# Patient Record
Sex: Male | Born: 1960 | Race: White | Hispanic: No | Smoking: Never smoker
Health system: Southern US, Community
[De-identification: ages and names within clinical notes are randomized; demographics above are authoritative.]

## PROBLEM LIST (undated history)

## (undated) DIAGNOSIS — K219 Gastro-esophageal reflux disease without esophagitis: Secondary | ICD-10-CM

## (undated) DIAGNOSIS — E119 Type 2 diabetes mellitus without complications: Secondary | ICD-10-CM

## (undated) DIAGNOSIS — E079 Disorder of thyroid, unspecified: Secondary | ICD-10-CM

## (undated) DIAGNOSIS — Z87442 Personal history of urinary calculi: Secondary | ICD-10-CM

## (undated) DIAGNOSIS — R519 Headache, unspecified: Secondary | ICD-10-CM

## (undated) DIAGNOSIS — K429 Umbilical hernia without obstruction or gangrene: Secondary | ICD-10-CM

## (undated) DIAGNOSIS — R51 Headache: Secondary | ICD-10-CM

## (undated) DIAGNOSIS — I219 Acute myocardial infarction, unspecified: Secondary | ICD-10-CM

## (undated) HISTORY — PX: TONSILLECTOMY: SUR1361

## (undated) MED FILL — Fosaprepitant Dimeglumine For IV Infusion 150 MG (Base Eq): INTRAVENOUS | Qty: 5 | Status: AC

## (undated) MED FILL — Dexamethasone Sodium Phosphate Inj 100 MG/10ML: INTRAMUSCULAR | Qty: 1 | Status: AC

---

## 2003-06-23 DIAGNOSIS — I219 Acute myocardial infarction, unspecified: Secondary | ICD-10-CM

## 2003-06-23 HISTORY — DX: Acute myocardial infarction, unspecified: I21.9

## 2004-05-22 ENCOUNTER — Other Ambulatory Visit: Payer: Self-pay

## 2004-05-22 ENCOUNTER — Emergency Department: Payer: Self-pay | Admitting: Emergency Medicine

## 2004-11-17 ENCOUNTER — Ambulatory Visit: Payer: Self-pay | Admitting: Neurology

## 2006-02-20 ENCOUNTER — Other Ambulatory Visit: Payer: Self-pay

## 2006-02-20 ENCOUNTER — Emergency Department: Payer: Self-pay | Admitting: Emergency Medicine

## 2007-12-13 ENCOUNTER — Ambulatory Visit: Payer: Self-pay | Admitting: Family Medicine

## 2009-09-28 ENCOUNTER — Emergency Department: Payer: Self-pay | Admitting: Emergency Medicine

## 2010-01-29 ENCOUNTER — Emergency Department: Payer: Self-pay | Admitting: Emergency Medicine

## 2010-02-03 ENCOUNTER — Emergency Department: Payer: Self-pay | Admitting: Emergency Medicine

## 2010-12-29 ENCOUNTER — Emergency Department: Payer: Self-pay | Admitting: *Deleted

## 2014-10-24 ENCOUNTER — Other Ambulatory Visit: Payer: Self-pay

## 2014-10-24 ENCOUNTER — Emergency Department
Admission: EM | Admit: 2014-10-24 | Discharge: 2014-10-24 | Disposition: A | Discharge status: Home / Self Care | Payer: 59 | Attending: Emergency Medicine | Admitting: Emergency Medicine

## 2014-10-24 ENCOUNTER — Emergency Department: Payer: 59

## 2014-10-24 ENCOUNTER — Encounter: Payer: Self-pay | Admitting: Emergency Medicine

## 2014-10-24 DIAGNOSIS — R109 Unspecified abdominal pain: Secondary | ICD-10-CM | POA: Diagnosis present

## 2014-10-24 DIAGNOSIS — Z87891 Personal history of nicotine dependence: Secondary | ICD-10-CM | POA: Insufficient documentation

## 2014-10-24 DIAGNOSIS — R1031 Right lower quadrant pain: Secondary | ICD-10-CM | POA: Insufficient documentation

## 2014-10-24 HISTORY — DX: Acute myocardial infarction, unspecified: I21.9

## 2014-10-24 HISTORY — DX: Disorder of thyroid, unspecified: E07.9

## 2014-10-24 LAB — CBC WITH DIFFERENTIAL/PLATELET
Basophils Absolute: 0.1 10*3/uL (ref 0–0.1)
Basophils Relative: 1 %
EOS ABS: 0.2 10*3/uL (ref 0–0.7)
Eosinophils Relative: 3 %
HCT: 48.6 % (ref 40.0–52.0)
HEMOGLOBIN: 16.9 g/dL (ref 13.0–18.0)
Lymphocytes Relative: 26 %
Lymphs Abs: 1.6 10*3/uL (ref 1.0–3.6)
MCH: 31.1 pg (ref 26.0–34.0)
MCHC: 34.8 g/dL (ref 32.0–36.0)
MCV: 89.4 fL (ref 80.0–100.0)
MONOS PCT: 14 %
Monocytes Absolute: 0.9 10*3/uL (ref 0.2–1.0)
Neutro Abs: 3.6 10*3/uL (ref 1.4–6.5)
Neutrophils Relative %: 56 %
Platelets: 166 10*3/uL (ref 150–440)
RBC: 5.43 MIL/uL (ref 4.40–5.90)
RDW: 12.8 % (ref 11.5–14.5)
WBC: 6.3 10*3/uL (ref 3.8–10.6)

## 2014-10-24 LAB — URINALYSIS COMPLETE WITH MICROSCOPIC (ARMC ONLY)
BACTERIA UA: NONE SEEN
BILIRUBIN URINE: NEGATIVE
GLUCOSE, UA: NEGATIVE mg/dL
Hgb urine dipstick: NEGATIVE
KETONES UR: NEGATIVE mg/dL
Leukocytes, UA: NEGATIVE
Nitrite: NEGATIVE
PROTEIN: NEGATIVE mg/dL
Specific Gravity, Urine: 1.017 (ref 1.005–1.030)
pH: 5 (ref 5.0–8.0)

## 2014-10-24 LAB — COMPREHENSIVE METABOLIC PANEL
ALBUMIN: 4.6 g/dL (ref 3.5–5.0)
ALT: 40 U/L (ref 17–63)
AST: 32 U/L (ref 15–41)
Alkaline Phosphatase: 76 U/L (ref 38–126)
Anion gap: 6 (ref 5–15)
BUN: 16 mg/dL (ref 6–20)
CALCIUM: 9.1 mg/dL (ref 8.9–10.3)
CO2: 26 mmol/L (ref 22–32)
Chloride: 107 mmol/L (ref 101–111)
Creatinine, Ser: 0.81 mg/dL (ref 0.61–1.24)
GFR calc Af Amer: >60 mL/min (ref >60)
Glucose, Bld: 119 mg/dL — ABNORMAL HIGH (ref 65–99)
Potassium: 4.2 mmol/L (ref 3.5–5.1)
SODIUM: 139 mmol/L (ref 135–145)
Total Bilirubin: 0.8 mg/dL (ref 0.3–1.2)
Total Protein: 7.4 g/dL (ref 6.5–8.1)

## 2014-10-24 LAB — LIPASE, BLOOD: Lipase: 38 U/L (ref 22–51)

## 2014-10-24 MED ORDER — IOHEXOL 300 MG/ML  SOLN
25.0000 mL | Freq: Once | INTRAMUSCULAR | Status: AC | PRN
  Administered 2014-10-24: 25 mL via ORAL

## 2014-10-24 MED ORDER — IOHEXOL 300 MG/ML  SOLN
100.0000 mL | Freq: Once | INTRAMUSCULAR | Status: AC | PRN
  Administered 2014-10-24: 100 mL via INTRAVENOUS

## 2014-10-24 NOTE — ED Notes (Signed)
Pt still awaiting to see edp.

## 2014-10-24 NOTE — ED Notes (Signed)
Sent here by [facility], for right flank pain x2 weeks , but on and off 3 months , denies urinary issues

## 2014-10-24 NOTE — ED Provider Notes (Signed)
[facility] Emergency Department Provider Note    ____________________________________________  Time seen:   I have reviewed the triage vital signs and the nursing notes.   HISTORY  Chief Complaint Flank Pain       HPI Joe Adams is a 54 y.o. male who states he's had right-sided abdominal pain for several weeks. It is intermittent it is moderate nothing seems to make it better or worse. No nausea no vomiting no diarrhea no constipation. It's always located in the right upper quadrant and right flank. He saw urgent care today and they referred him to emergency department for CT scan for palpable mass on the right side.     Past Medical History  Diagnosis Date  . Acute MI   . Thyroid disease     There are no active problems to display for this patient.   No past surgical history on file.  Current Outpatient Rx  Name  Route  Sig  Dispense  Refill  . ibuprofen (ADVIL,MOTRIN) 200 MG tablet   Oral   Take 200 mg by mouth every 6 (six) hours as needed.           Allergies Review of patient's allergies indicates no known allergies.  No family history on file.  Social History History  Substance Use Topics  . Smoking status: Former Research scientist (life sciences)  . Smokeless tobacco: Not on file  . Alcohol Use: Yes    Review of Systems  Constitutional: Negative for fever. Eyes: Negative for visual changes. ENT: Negative for sore throat. Cardiovascular: Negative for chest pain. Respiratory: Negative for shortness of breath. Gastrointestinal: Negative for vomiting and diarrhea. Genitourinary: Negative for dysuria. Musculoskeletal: Negative for back pain. Skin: Negative for rash. Neurological: Negative for headaches, focal weakness or numbness.   10-point ROS otherwise negative.  ____________________________________________   PHYSICAL EXAM:  VITAL SIGNS: ED Triage Vitals  Enc Vitals Group     BP 10/24/14 1043 138/87 mmHg     Pulse Rate  10/24/14 1043 64     Resp 10/24/14 1043 18     Temp 10/24/14 1043 97.6 F (36.4 C)     Temp Source 10/24/14 1043 Oral     SpO2 10/24/14 1043 99 %     Weight 10/24/14 1043 198 lb (89.812 kg)     Height 10/24/14 1043 5\' 5"  (1.651 m)     Head Cir --      Peak Flow --      Pain Score 10/24/14 1047 6     Pain Loc --      Pain Edu? --      Excl. in [place]? --      Constitutional: Alert and oriented. Well appearing and in no distress. Eyes: Conjunctivae are normal. PERRL. Normal extraocular movements. ENT   Head: Normocephalic and atraumatic.   Nose: No congestion/rhinnorhea.   Mouth/Throat: Mucous membranes are moist.   Neck: No stridor. Hematological/Lymphatic/Immunilogical: No cervical lymphadenopathy. Cardiovascular: Normal rate, regular rhythm. Normal and symmetric distal pulses are present in all extremities. No murmurs, rubs, or gallops. Respiratory: Normal respiratory effort without tachypnea nor retractions. Breath sounds are clear and equal bilaterally. No wheezes/rales/rhonchi. Gastrointestinal: Soft with tenderness of the right abdomen and right upper quadrant.. No distention. He does have a palpable mass in the right side of the abdomen which I suspect is the liver tip Genitourinary:  Musculoskeletal: Nontender with normal range of motion in all extremities. No joint effusions.  No lower extremity tenderness nor edema. Neurologic:  Normal  speech and language. No gross focal neurologic deficits are appreciated. Speech is normal. No gait instability. Skin:  Skin is warm, dry and intact. No rash noted. Psychiatric: Mood and affect are normal. Speech and behavior are normal. Patient exhibits appropriate insight and judgment.  ____________________________________________    LABS (pertinent positives/negatives)   Lipase negative urinalysis negative metabolic panel unremarkable white blood cell count normal ____________________________________________   EKG  Sinus  bradycardia 57 bpm narrow QRS normal axis normal ST and T-wave  ____________________________________________    RADIOLOGY   CT result reviewed there is mild hepatic fatty infiltration otherwise no acute findings ____________________________________________   PROCEDURES  Procedure(s) performed: None  Critical Care performed: No  ____________________________________________   INITIAL IMPRESSION / ASSESSMENT AND PLAN / ED COURSE  Pertinent labs & imaging results that were available during my care of the patient were reviewed by me and considered in my medical decision making (see chart for details).  Although patient's laboratory findings are unremarkable he does have a palpable mass on the right side of his abdomen and I will go ahead and perform a CT.  No acute abnormality found discussed return precautions and follow-up plan with primary care doctor.  ____________________________________________   FINAL CLINICAL IMPRESSION(S) / ED DIAGNOSES  Final diagnoses:  None   intermittent right-sided abdominal pain, nonspecific   Lisa Roca, MD 10/24/14 1523

## 2014-10-24 NOTE — ED Notes (Signed)
Pt returns from ct at this time.

## 2014-10-24 NOTE — ED Notes (Signed)
Patient transported to CT 

## 2014-10-24 NOTE — Discharge Instructions (Signed)

## 2014-10-31 ENCOUNTER — Emergency Department: Payer: 59

## 2014-10-31 ENCOUNTER — Emergency Department
Admission: EM | Admit: 2014-10-31 | Discharge: 2014-10-31 | Disposition: A | Discharge status: Home / Self Care | Payer: 59 | Attending: Emergency Medicine | Admitting: Emergency Medicine

## 2014-10-31 ENCOUNTER — Encounter: Payer: Self-pay | Admitting: Emergency Medicine

## 2014-10-31 DIAGNOSIS — R109 Unspecified abdominal pain: Secondary | ICD-10-CM | POA: Diagnosis not present

## 2014-10-31 LAB — COMPREHENSIVE METABOLIC PANEL
ALT: 41 U/L (ref 17–63)
ANION GAP: 7 (ref 5–15)
AST: 34 U/L (ref 15–41)
Albumin: 4.5 g/dL (ref 3.5–5.0)
Alkaline Phosphatase: 72 U/L (ref 38–126)
BILIRUBIN TOTAL: 0.8 mg/dL (ref 0.3–1.2)
BUN: 24 mg/dL — AB (ref 6–20)
CO2: 26 mmol/L (ref 22–32)
Calcium: 8.9 mg/dL (ref 8.9–10.3)
Chloride: 106 mmol/L (ref 101–111)
Creatinine, Ser: 0.92 mg/dL (ref 0.61–1.24)
GFR calc Af Amer: >60 mL/min (ref >60)
GLUCOSE: 127 mg/dL — AB (ref 65–99)
POTASSIUM: 3.6 mmol/L (ref 3.5–5.1)
Sodium: 139 mmol/L (ref 135–145)
Total Protein: 7.3 g/dL (ref 6.5–8.1)

## 2014-10-31 LAB — CBC WITH DIFFERENTIAL/PLATELET
Basophils Absolute: 0.1 10*3/uL (ref 0–0.1)
Basophils Relative: 1 %
Eosinophils Absolute: 0.2 10*3/uL (ref 0–0.7)
Eosinophils Relative: 2 %
HEMATOCRIT: 46.1 % (ref 40.0–52.0)
Hemoglobin: 16.1 g/dL (ref 13.0–18.0)
LYMPHS ABS: 1.8 10*3/uL (ref 1.0–3.6)
LYMPHS PCT: 26 %
MCH: 31.1 pg (ref 26.0–34.0)
MCHC: 34.9 g/dL (ref 32.0–36.0)
MCV: 89 fL (ref 80.0–100.0)
MONO ABS: 0.4 10*3/uL (ref 0.2–1.0)
MONOS PCT: 6 %
NEUTROS ABS: 4.7 10*3/uL (ref 1.4–6.5)
Neutrophils Relative %: 65 %
Platelets: 172 10*3/uL (ref 150–440)
RBC: 5.19 MIL/uL (ref 4.40–5.90)
RDW: 12.8 % (ref 11.5–14.5)
WBC: 7.2 10*3/uL (ref 3.8–10.6)

## 2014-10-31 LAB — URINALYSIS COMPLETE WITH MICROSCOPIC (ARMC ONLY)
Bilirubin Urine: NEGATIVE
Glucose, UA: NEGATIVE mg/dL
HGB URINE DIPSTICK: NEGATIVE
Ketones, ur: NEGATIVE mg/dL
Leukocytes, UA: NEGATIVE
NITRITE: NEGATIVE
PH: 5 (ref 5.0–8.0)
Protein, ur: NEGATIVE mg/dL
Specific Gravity, Urine: 1.021 (ref 1.005–1.030)
Squamous Epithelial / LPF: NONE SEEN

## 2014-10-31 MED ORDER — OXYCODONE-ACETAMINOPHEN 5-325 MG PO TABS
ORAL_TABLET | ORAL | Status: AC
  Filled 2014-10-31: qty 2

## 2014-10-31 MED ORDER — OXYCODONE-ACETAMINOPHEN 5-325 MG PO TABS
2.0000 | ORAL_TABLET | Freq: Once | ORAL | Status: AC
  Administered 2014-10-31: 2 via ORAL

## 2014-10-31 MED ORDER — CYCLOBENZAPRINE HCL 10 MG PO TABS: 10.0000 mg | ORAL_TABLET | Freq: Three times a day (TID) | ORAL | Status: AC | PRN

## 2014-10-31 NOTE — ED Provider Notes (Signed)
[facility] Emergency Department Provider Note    Time seen: 1850  I have reviewed the triage vital signs and the nursing notes.   HISTORY  Chief Complaint Flank Pain    HPI Joe Adams is a 54 y.o. male who presents ER for right sided abdominal pain for the last few weeks. Nothing seems to make it better pressure on the area or touching scan makes it worse.His pain is severe and dull, states it feels like when he had a kidney stone. Seen here recently had a CT scan is normal.    Past Medical History  Diagnosis Date  . Acute MI   . Thyroid disease     There are no active problems to display for this patient.   History reviewed. No pertinent past surgical history.  Current Outpatient Rx  Name  Route  Sig  Dispense  Refill  . ibuprofen (ADVIL,MOTRIN) 200 MG tablet   Oral   Take 200 mg by mouth every 6 (six) hours as needed.           Allergies Review of patient's allergies indicates no known allergies.  No family history on file.  Social History History  Substance Use Topics  . Smoking status: Never Smoker   . Smokeless tobacco: Not on file  . Alcohol Use: No    Review of Systems Constitutional: Negative for fever. Eyes: Negative for visual changes. ENT: Negative for sore throat. Cardiovascular: Negative for chest pain. Respiratory: Negative for shortness of breath. Gastrointestinal: Positive for abdominal pain negative for vomiting Genitourinary: Negative for dysuria. Musculoskeletal: Negative for back pain. Skin: Negative for rash. Neurological: Negative for headaches, focal weakness or numbness.  10-point ROS otherwise negative.  ____________________________________________   PHYSICAL EXAM:  VITAL SIGNS: ED Triage Vitals  Enc Vitals Group     BP 10/31/14 1836 132/92 mmHg     Pulse Rate 10/31/14 1836 65     Resp 10/31/14 1836 18     Temp 10/31/14 1836 98.4 F (36.9 C)     Temp Source 10/31/14 1836 Oral      SpO2 10/31/14 1836 97 %     Weight --      Height --      Head Cir --      Peak Flow --      Pain Score 10/31/14 1514 9     Pain Loc --      Pain Edu? --      Excl. in [place]? --     Constitutional: Alert and oriented. Well appearing and in no distress. Eyes: Conjunctivae are normal. PERRL. Normal extraocular movements. ENT   Head: Normocephalic and atraumatic.   Nose: No congestion/rhinnorhea.   Mouth/Throat: Mucous membranes are moist.   Neck: No stridor. Hematological/Lymphatic/Immunilogical: No cervical lymphadenopathy. Cardiovascular: Normal rate, regular rhythm. Normal and symmetric distal pulses are present in all extremities. No murmurs, rubs, or gallops. Respiratory: Normal respiratory effort without tachypnea nor retractions. Breath sounds are clear and equal bilaterally. No wheezes/rales/rhonchi. Gastrointestinal: Exquisite tenderness right flank and in the latissimus dorsi area Musculoskeletal: Nontender with normal range of motion in all extremities. No joint effusions.  No lower extremity tenderness nor edema. Neurologic:  Normal speech and language. No gross focal neurologic deficits are appreciated. Speech is normal. No gait instability. Skin:  Skin is warm, dry and intact. No rash noted. Psychiatric: Mood and affect are normal. Speech and behavior are normal. Patient exhibits appropriate insight and judgment.  ____________________________________________    LABS (  pertinent positives/negatives)  Labs Reviewed  COMPREHENSIVE METABOLIC PANEL - Abnormal; Notable for the following:    Glucose, Bld 127 (*)    BUN 24 (*)    All other components within normal limits  URINALYSIS COMPLETEWITH MICROSCOPIC ([facility])  - Abnormal; Notable for the following:    Color, Urine YELLOW (*)    APPearance CLEAR (*)    Bacteria, UA RARE (*)    All other components within normal limits  CBC WITH DIFFERENTIAL/PLATELET      ____________________________________________    RADIOLOGY  CT pelvis without contrast per stone is negative for any acute process  ____________________________________________    ED COURSE  Pertinent labs & imaging results that were available during my care of the patient were reviewed by me and considered in my medical decision making (see chart for details).  Patient will get a noncontrast CT to check for renal stone  FINAL ASSESSMENT AND PLAN  Flank pain Plan: Patient discharged with muscle relaxants and follow-up with a primary care doctor.    Earleen Newport, MD   Earleen Newport, MD 10/31/14 [phone]

## 2014-10-31 NOTE — Discharge Instructions (Signed)
Flank Pain °Flank pain refers to pain that is located on the side of the body between the upper abdomen and the back. The pain may occur over a short period of time (acute) or may be long-term or reoccurring (chronic). It may be mild or severe. Flank pain can be caused by many things. °CAUSES  °Some of the more common causes of flank pain include: °· Muscle strains.   °· Muscle spasms.   °· A disease of your spine (vertebral disk disease).   °· A lung infection (pneumonia).   °· Fluid around your lungs (pulmonary edema).   °· A kidney infection.   °· Kidney stones.   °· A very painful skin rash caused by the chickenpox virus (shingles).   °· Gallbladder disease.   °HOME CARE INSTRUCTIONS  °Home care will depend on the cause of your pain. In general, °· Rest as directed by your caregiver. °· Drink enough fluids to keep your urine clear or pale yellow. °· Only take over-the-counter or prescription medicines as directed by your caregiver. Some medicines may help relieve the pain. °· Tell your caregiver about any changes in your pain. °· Follow up with your caregiver as directed. °SEEK IMMEDIATE MEDICAL CARE IF:  °· Your pain is not controlled with medicine.   °· You have new or worsening symptoms. °· Your pain increases.   °· You have abdominal pain.   °· You have shortness of breath.   °· You have persistent nausea or vomiting.   °· You have swelling in your abdomen.   °· You feel faint or pass out.   °· You have blood in your urine. °· You have a fever or persistent symptoms for more than 2-3 days. °· You have a fever and your symptoms suddenly get worse. °MAKE SURE YOU:  °· Understand these instructions. °· Will watch your condition. °· Will get help right away if you are not doing well or get worse. °Document Released: 07/30/2005 Document Revised: 03/02/2012 Document Reviewed: 01/21/2012 °Exit[employer]® Patient Information ©2015 [employer]. This information is not intended to replace advice given to you by your  health care provider. Make sure you discuss any questions you have with your health care provider. ° °

## 2016-03-18 IMAGING — CT CT ABD-PELV W/O CM
1 of 2 series · 5 of 32 positions shown, 10 images · non-contrast
Comparison: CT scan of October 24, 2014.

CLINICAL DATA: Right flank pain for 3 months.

EXAM:
CT ABDOMEN AND PELVIS WITHOUT CONTRAST
TECHNIQUE: Multidetector CT imaging of the abdomen and pelvis was performed
following the standard protocol without IV contrast.

[Series 4: lung · axial · 0.83mm/px · z∈[+761,+841]mm · 5 of 24 slices shown, 10 images]
[im 4/24  soft-tissue]
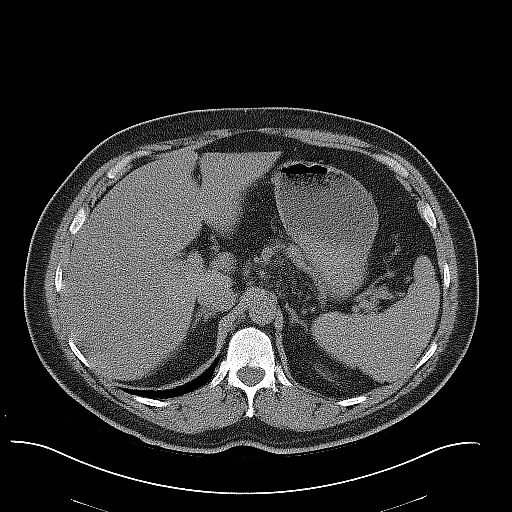
[im 4/24  bone]
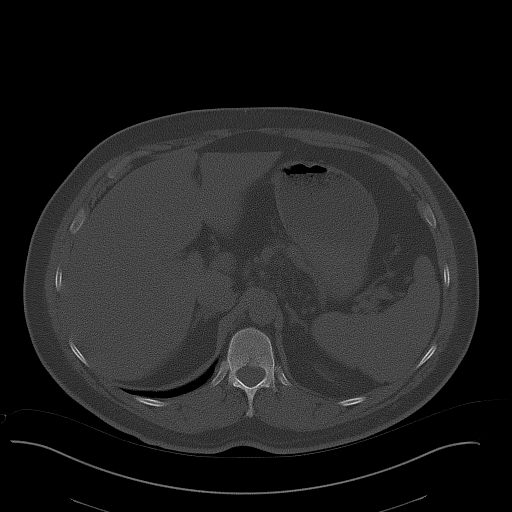
[im 8/24  soft-tissue]
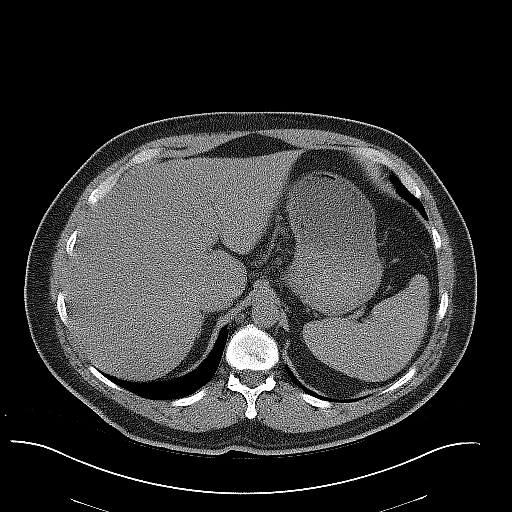
[im 8/24  lung]
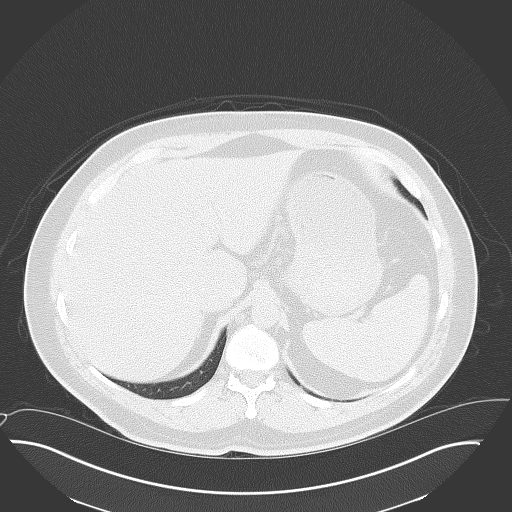
[im 12/24  soft-tissue]
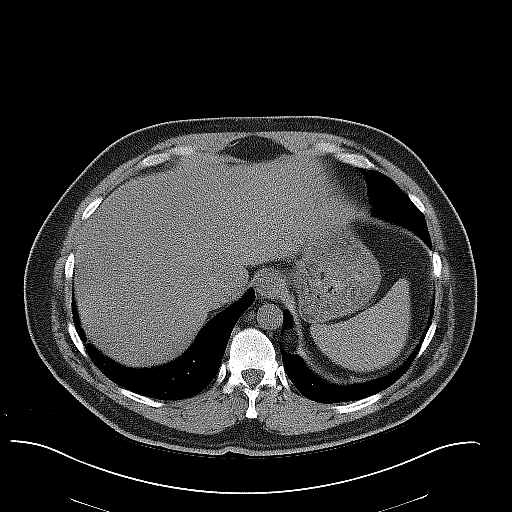
[im 12/24  lung]
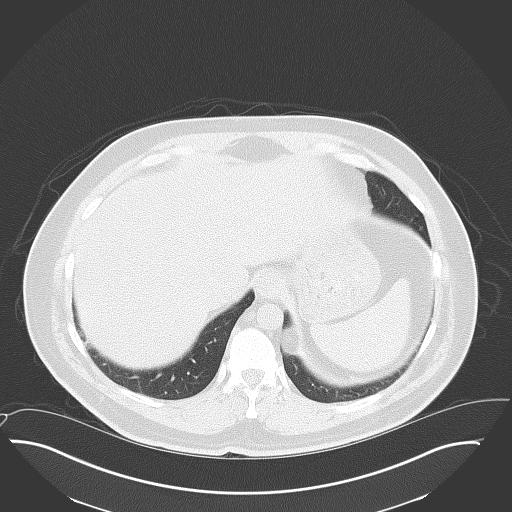
[im 16/24  soft-tissue]
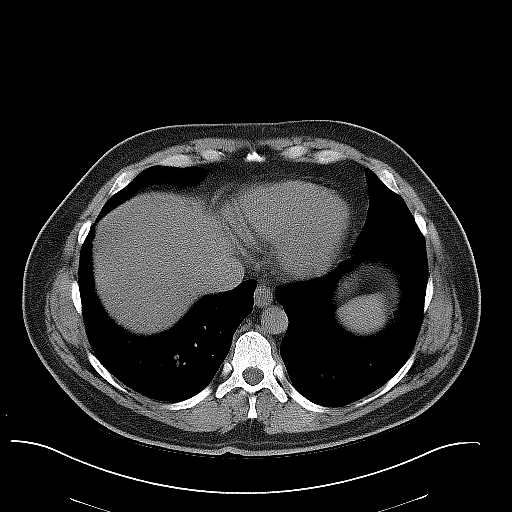
[im 16/24  lung]
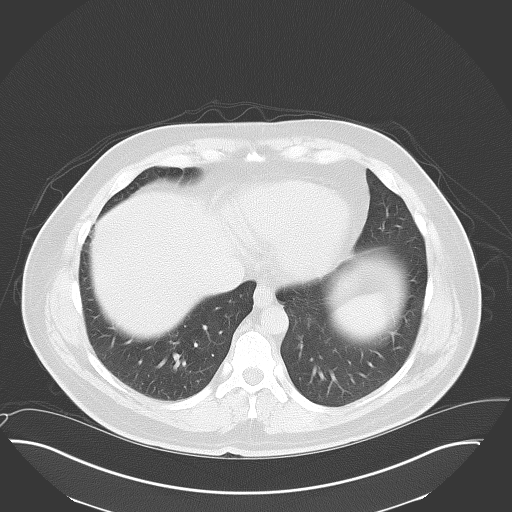
[im 20/24  soft-tissue]
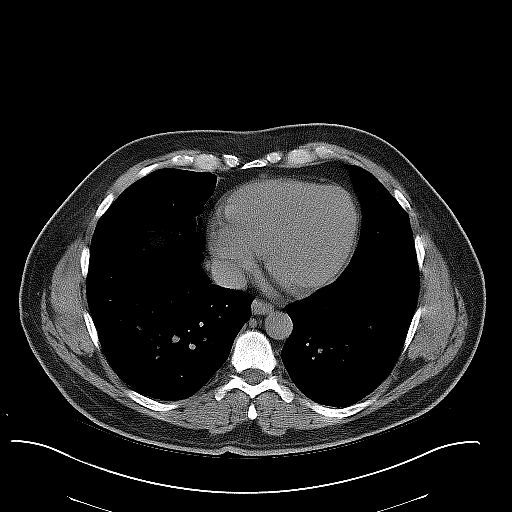
[im 20/24  lung]
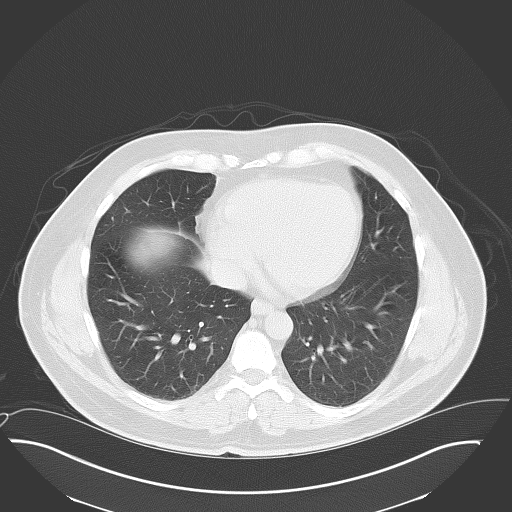

[5 of 32 positions shown; findings below may reference images not displayed]

FINDINGS: Mild multilevel degenerative disc disease is noted in the lumbar
spine. Visualized lung bases appear normal.

No gallstones are noted. Mild fatty infiltration of the liver is
noted. The spleen and pancreas appear normal. Adrenal glands appear
normal. No hydronephrosis or renal obstruction is noted. No renal or
ureteral calculi are noted. The appendix appears normal. There is no
evidence of bowel obstruction. No abnormal fluid collection is
noted. Urinary bladder appears normal. Small fat containing right
inguinal hernia is noted. Prostatic calcifications are noted. No
significant adenopathy is noted.
IMPRESSION: Mild fatty infiltration of the liver. No renal or ureteral calculi
are noted. No hydronephrosis or renal obstruction is noted.

## 2016-11-04 ENCOUNTER — Encounter: Payer: Self-pay | Admitting: *Deleted

## 2016-11-12 ENCOUNTER — Ambulatory Visit (INDEPENDENT_AMBULATORY_CARE_PROVIDER_SITE_OTHER): Payer: BLUE CROSS/BLUE SHIELD | Admitting: General Surgery

## 2016-11-12 ENCOUNTER — Encounter: Payer: Self-pay | Admitting: General Surgery

## 2016-11-12 VITALS — BP 136/72 | HR 60 | Resp 14 | Ht 66.0 in | Wt 200.0 lb

## 2016-11-12 DIAGNOSIS — K644 Residual hemorrhoidal skin tags: Secondary | ICD-10-CM

## 2016-11-12 DIAGNOSIS — Z1211 Encounter for screening for malignant neoplasm of colon: Secondary | ICD-10-CM | POA: Diagnosis not present

## 2016-11-12 MED ORDER — POLYETHYLENE GLYCOL 3350 17 GM/SCOOP PO POWD: ORAL | 0 refills | Status: DC

## 2016-11-12 NOTE — Progress Notes (Signed)
Patient ID: Joe Adams, male   DOB: Feb 03, 1961, 56 y.o.   MRN: [id number]  Chief Complaint  Patient presents with  . Other    hemorrhoid    HPI Joe Adams is a 56 y.o. male.  Here today for evaluation of hemorrhoids. He states he started having rectal pain about one month ago. He states he has had the hemorrhoid for 20 years. Bowels move daily, no bleeding. He has been using OTC hemorrhoid cream which has helped some.  HPI  Past Medical History:  Diagnosis Date  . Acute MI ([place])   . Thyroid disease     No past surgical history on file.  No family history on file.  Social History Social History  Substance Use Topics  . Smoking status: Never Smoker  . Smokeless tobacco: Never Used  . Alcohol use Yes     Comment: 2/week    No Known Allergies  Current Outpatient Prescriptions  Medication Sig Dispense Refill  . ibuprofen (ADVIL,MOTRIN) 200 MG tablet Take 200 mg by mouth every 6 (six) hours as needed.    Marland Kitchen levothyroxine (SYNTHROID, LEVOTHROID) 25 MCG tablet Take by mouth.     No current facility-administered medications for this visit.     Review of Systems Review of Systems  Constitutional: Negative.   Respiratory: Negative.   Cardiovascular: Negative.     Blood pressure 136/72, pulse 60, resp. rate 14, height 5\' 6"  (1.676 m), weight 200 lb (90.7 kg).  Physical Exam Physical Exam  Constitutional: He is oriented to person, place, and time. He appears well-developed and well-nourished.  Eyes: Conjunctivae are normal. No scleral icterus.  Neck: Neck supple.  Cardiovascular: Normal rate, regular rhythm and normal heart sounds.   Pulmonary/Chest: Effort normal and breath sounds normal.  Abdominal: Soft. Normal appearance and bowel sounds are normal. There is no hepatomegaly. There is no tenderness. A hernia (Small umbilical hernia) is present.  Genitourinary: Rectal exam shows external hemorrhoid. Rectal exam shows anal tone normal.     Lymphadenopathy:   He has no cervical adenopathy.  Neurological: He is alert and oriented to person, place, and time.  Skin: Skin is warm and dry.    Data Reviewed Notes reviewed   Assessment    Long standing history of hemorrhoid in same location.This is best dealt with by hemorrhoidectomy. Pt has not had a colonoscopy- has been reluctant to have one. Discussed fully with pt. Recommended colonoscopy and hemorrhoidectomy at same time.Procedure explained, as also  risks and benefits. He is agreeable.  Plan    Schedule colonoscopy and hemorrhoidectomy  HPI, Physical Exam, Assessment and Plan have been scribed under the direction and in the presence of Mckinley Jewel, MD  Gaspar Cola, CMA     I have completed the exam and reviewed the above documentation for accuracy and completeness.  I agree with the above.  Haematologist has been used and any errors in dictation or transcription are unintentional.  Dellas Guard G. Jamal Collin, M.D., F.A.C.S.    Junie Panning G 11/12/2016, 2:39 PM   Patient has been scheduled for a colonoscopy and hemorrhoidectomy on 11-18-16 at [facility]. Miralax prescription has been sent in to the patient's pharmacy today. Colonoscopy instructions have been reviewed with the patient. This patient is aware to call the office if they have further questions.   Dominga Ferry, CMA

## 2016-11-13 ENCOUNTER — Telehealth: Payer: Self-pay

## 2016-11-13 NOTE — Telephone Encounter (Signed)
Call to patient to notify him of pre admit time and date. He is schedule for pre admit at the hospital on 11/17/16 at 2:30 pm, and to call [phone] the day before for his arrival time for surgery. Message left with patient's mother for him to call back.

## 2016-11-13 NOTE — Telephone Encounter (Signed)
-----   Message from Dominga Ferry, [place] sent at 11/12/2016  5:17 PM EDT ----- Please contact patient with Pre-admission appointment day and time once the hospital arranges. Also, please provide him with the correct number to call for his arrival time, [phone] (patient having colonoscopy and hemorrhoidectomy in the [facility].-chart on my desk). Thanks.

## 2016-11-13 NOTE — Telephone Encounter (Signed)
Patient notified

## 2016-11-17 ENCOUNTER — Encounter
Admission: RE | Admit: 2016-11-17 | Discharge: 2016-11-17 | Disposition: A | Discharge status: Home / Self Care | Payer: BLUE CROSS/BLUE SHIELD | Source: Ambulatory Visit | Attending: General Surgery | Admitting: General Surgery

## 2016-11-17 DIAGNOSIS — K219 Gastro-esophageal reflux disease without esophagitis: Secondary | ICD-10-CM | POA: Diagnosis not present

## 2016-11-17 DIAGNOSIS — K644 Residual hemorrhoidal skin tags: Secondary | ICD-10-CM | POA: Diagnosis not present

## 2016-11-17 DIAGNOSIS — K648 Other hemorrhoids: Secondary | ICD-10-CM | POA: Diagnosis not present

## 2016-11-17 DIAGNOSIS — I252 Old myocardial infarction: Secondary | ICD-10-CM | POA: Diagnosis not present

## 2016-11-17 DIAGNOSIS — Z79899 Other long term (current) drug therapy: Secondary | ICD-10-CM | POA: Diagnosis not present

## 2016-11-17 DIAGNOSIS — E079 Disorder of thyroid, unspecified: Secondary | ICD-10-CM | POA: Diagnosis not present

## 2016-11-17 HISTORY — DX: Gastro-esophageal reflux disease without esophagitis: K21.9

## 2016-11-17 HISTORY — DX: Headache: R51

## 2016-11-17 HISTORY — DX: Headache, unspecified: R51.9

## 2016-11-17 HISTORY — DX: Personal history of urinary calculi: Z87.442

## 2016-11-17 NOTE — Patient Instructions (Signed)
  Your procedure is scheduled on: Nov 18, 2016 (Wednesday) Report to Same Day Surgery 2nd floor medical mall [facility] Entrance-take elevator on left to 2nd floor.  Check in with surgery information desk.) ARRIVAL TIME 10:00 AM  Remember: Instructions that are not followed completely may result in serious medical risk, up to and including death, or upon the discretion of your surgeon and anesthesiologist your surgery may need to be rescheduled.    _x___ 1. Do not eat food or drink liquids after midnight. No gum chewing or hard candies                            __x__ 2. No Alcohol for 24 hours before or after surgery.   __x__3. No Smoking for 24 prior to surgery.   ____  4. Bring all medications with you on the day of surgery if instructed.    __x__ 5. Notify your doctor if there is any change in your medical condition     (cold, fever, infections).     Do not wear jewelry, make-up, hairpins, clips or nail polish.  Do not wear lotions, powders, or perfumes. You may wear deodorant.  Do not shave 48 hours prior to surgery. Men may shave face and neck.  Do not bring valuables to the hospital.    [facility] is not responsible for any belongings or valuables.               Contacts, dentures or bridgework may not be worn into surgery.  Leave your suitcase in the car. After surgery it may be brought to your room.  For patients admitted to the hospital, discharge time is determined by your  treatment team                     Patients discharged the day of surgery will not be allowed to drive home.  You will need someone to drive you home and stay with you the night of your procedure.    Please read over the following fact sheets that you were given:   [facility] Preparing for Surgery and or MRSA Information   _x___ Take the following medications with a sip of water the morning of surgery:  1. LEVOTHYROXINE  2. FOLLOW DR Jamal Collin COLONSCOPY PREP INSTRUCTIONS AS  ORDERED  3.  4.  5.  6.  ____Fleets enema or Magnesium Citrate as directed.   __ Use CHG Soap or sage wipes as directed on instruction sheet   ____ Use inhalers on the day of surgery and bring to hospital day of surgery  ____ Stop Metformin and Janumet 2 days prior to surgery.    ____ Take 1/2 of usual insulin dose the night before surgery and none on the morning     surgery.   _x___ Follow recommendations from Cardiologist, Pulmonologist or PCP regarding          stopping Aspirin, Coumadin, Pllavix ,Eliquis, Effient, or Pradaxa, and Pletal.  X____Stop Anti-inflammatories such as Advil, Aleve, Ibuprofen, Motrin, Naproxen, Naprosyn, Goodies powders or aspirin products. OK to take Tylenol (NO IBUPROFEN )   _x___ Stop supplements until after surgery.  But may continue Vitamin D, Vitamin B,       and multivitamin.   ____ Bring C-Pap to the hospital.

## 2016-11-18 ENCOUNTER — Ambulatory Visit: Payer: BLUE CROSS/BLUE SHIELD | Admitting: Anesthesiology

## 2016-11-18 ENCOUNTER — Encounter
Admission: RE | Disposition: A | Discharge status: Home / Self Care | Payer: Self-pay | Source: Ambulatory Visit | Attending: General Surgery

## 2016-11-18 ENCOUNTER — Encounter: Payer: Self-pay | Admitting: *Deleted

## 2016-11-18 ENCOUNTER — Ambulatory Visit
Admission: RE | Admit: 2016-11-18 | Discharge: 2016-11-18 | Disposition: A | Discharge status: Home / Self Care | Payer: BLUE CROSS/BLUE SHIELD | Source: Ambulatory Visit | Attending: General Surgery | Admitting: General Surgery

## 2016-11-18 DIAGNOSIS — E079 Disorder of thyroid, unspecified: Secondary | ICD-10-CM | POA: Insufficient documentation

## 2016-11-18 DIAGNOSIS — K648 Other hemorrhoids: Secondary | ICD-10-CM | POA: Diagnosis not present

## 2016-11-18 DIAGNOSIS — Z79899 Other long term (current) drug therapy: Secondary | ICD-10-CM | POA: Insufficient documentation

## 2016-11-18 DIAGNOSIS — K644 Residual hemorrhoidal skin tags: Secondary | ICD-10-CM | POA: Insufficient documentation

## 2016-11-18 DIAGNOSIS — I252 Old myocardial infarction: Secondary | ICD-10-CM | POA: Insufficient documentation

## 2016-11-18 DIAGNOSIS — K219 Gastro-esophageal reflux disease without esophagitis: Secondary | ICD-10-CM | POA: Insufficient documentation

## 2016-11-18 DIAGNOSIS — Z1211 Encounter for screening for malignant neoplasm of colon: Secondary | ICD-10-CM | POA: Diagnosis not present

## 2016-11-18 HISTORY — PX: COLONOSCOPY: SHX5424

## 2016-11-18 HISTORY — PX: HEMORRHOID SURGERY: SHX153

## 2016-11-18 HISTORY — DX: Umbilical hernia without obstruction or gangrene: K42.9

## 2016-11-18 HISTORY — PX: COLONOSCOPY WITH PROPOFOL: SHX5780

## 2016-11-18 SURGERY — COLONOSCOPY
Anesthesia: General | Wound class: Contaminated

## 2016-11-18 SURGERY — COLONOSCOPY WITH PROPOFOL
Anesthesia: General

## 2016-11-18 MED ORDER — BUPIVACAINE LIPOSOME 1.3 % IJ SUSP
INTRAMUSCULAR | Status: AC
  Filled 2016-11-18: qty 20

## 2016-11-18 MED ORDER — BUPIVACAINE HCL (PF) 0.5 % IJ SOLN
INTRAMUSCULAR | Status: AC
  Filled 2016-11-18: qty 30

## 2016-11-18 MED ORDER — OXYCODONE HCL 5 MG PO TABS
5.0000 mg | ORAL_TABLET | Freq: Once | ORAL | Status: DC | PRN
  Filled 2016-11-18: qty 1

## 2016-11-18 MED ORDER — ONDANSETRON HCL 4 MG/2ML IJ SOLN: 4.0000 mg | Freq: Once | INTRAMUSCULAR | Status: DC | PRN

## 2016-11-18 MED ORDER — FENTANYL CITRATE (PF) 100 MCG/2ML IJ SOLN
INTRAMUSCULAR | Status: DC | PRN
  Administered 2016-11-18: 25 ug via INTRAVENOUS
  Administered 2016-11-18: 50 ug via INTRAVENOUS
  Administered 2016-11-18: 25 ug via INTRAVENOUS

## 2016-11-18 MED ORDER — MIDAZOLAM HCL 2 MG/2ML IJ SOLN
INTRAMUSCULAR | Status: DC | PRN
  Administered 2016-11-18: 2 mg via INTRAVENOUS

## 2016-11-18 MED ORDER — FAMOTIDINE 20 MG PO TABS
20.0000 mg | ORAL_TABLET | Freq: Once | ORAL | Status: AC
  Administered 2016-11-18: 20 mg via ORAL

## 2016-11-18 MED ORDER — FENTANYL CITRATE (PF) 100 MCG/2ML IJ SOLN: 25.0000 ug | INTRAMUSCULAR | Status: DC | PRN

## 2016-11-18 MED ORDER — LACTATED RINGERS IV SOLN
INTRAVENOUS | Status: DC
  Administered 2016-11-18: 10:00:00 via INTRAVENOUS

## 2016-11-18 MED ORDER — EPHEDRINE SULFATE 50 MG/ML IJ SOLN
INTRAMUSCULAR | Status: DC | PRN
  Administered 2016-11-18: 10 mg via INTRAVENOUS
  Administered 2016-11-18: 5 mg via INTRAVENOUS

## 2016-11-18 MED ORDER — LIDOCAINE HCL (PF) 2 % IJ SOLN
INTRAMUSCULAR | Status: AC
  Filled 2016-11-18: qty 2

## 2016-11-18 MED ORDER — PROMETHAZINE HCL 25 MG/ML IJ SOLN: 6.2500 mg | INTRAMUSCULAR | Status: DC | PRN

## 2016-11-18 MED ORDER — SODIUM CHLORIDE 0.9 % IV SOLN
INTRAVENOUS | Status: DC | PRN
  Administered 2016-11-18: 40 mL

## 2016-11-18 MED ORDER — FENTANYL CITRATE (PF) 100 MCG/2ML IJ SOLN
INTRAMUSCULAR | Status: AC
  Filled 2016-11-18: qty 2

## 2016-11-18 MED ORDER — LIDOCAINE HCL (CARDIAC) 20 MG/ML IV SOLN
INTRAVENOUS | Status: DC | PRN
  Administered 2016-11-18: 50 mg via INTRAVENOUS

## 2016-11-18 MED ORDER — ONDANSETRON HCL 4 MG/2ML IJ SOLN
INTRAMUSCULAR | Status: DC | PRN
  Administered 2016-11-18: 4 mg via INTRAVENOUS

## 2016-11-18 MED ORDER — FAMOTIDINE 20 MG PO TABS
ORAL_TABLET | ORAL | Status: AC
  Filled 2016-11-18: qty 1

## 2016-11-18 MED ORDER — LACTATED RINGERS IV SOLN
INTRAVENOUS | Status: DC | PRN
  Administered 2016-11-18 (×2): via INTRAVENOUS

## 2016-11-18 MED ORDER — BACITRACIN ZINC 500 UNIT/GM EX OINT
TOPICAL_OINTMENT | CUTANEOUS | Status: AC
Start: 2016-11-18 — End: 2016-11-18
  Filled 2016-11-18: qty 28.35

## 2016-11-18 MED ORDER — KETOROLAC TROMETHAMINE 30 MG/ML IJ SOLN
INTRAMUSCULAR | Status: AC
  Filled 2016-11-18: qty 1

## 2016-11-18 MED ORDER — OXYCODONE HCL 5 MG/5ML PO SOLN
5.0000 mg | Freq: Once | ORAL | Status: DC | PRN
  Filled 2016-11-18: qty 5

## 2016-11-18 MED ORDER — KETOROLAC TROMETHAMINE 30 MG/ML IJ SOLN
INTRAMUSCULAR | Status: DC | PRN
  Administered 2016-11-18: 30 mg via INTRAVENOUS

## 2016-11-18 MED ORDER — SODIUM CHLORIDE FLUSH 0.9 % IV SOLN
INTRAVENOUS | Status: AC
  Filled 2016-11-18: qty 20

## 2016-11-18 MED ORDER — ONDANSETRON HCL 4 MG/2ML IJ SOLN
INTRAMUSCULAR | Status: AC
  Filled 2016-11-18: qty 2

## 2016-11-18 MED ORDER — MIDAZOLAM HCL 2 MG/2ML IJ SOLN
INTRAMUSCULAR | Status: AC
  Filled 2016-11-18: qty 2

## 2016-11-18 MED ORDER — OXYCODONE-ACETAMINOPHEN 5-325 MG PO TABS: 1.0000 | ORAL_TABLET | ORAL | 0 refills | Status: DC | PRN

## 2016-11-18 MED ORDER — MEPERIDINE HCL 25 MG/ML IJ SOLN
6.2500 mg | INTRAMUSCULAR | Status: DC | PRN
  Filled 2016-11-18: qty 1

## 2016-11-18 MED ORDER — PROPOFOL 10 MG/ML IV BOLUS
INTRAVENOUS | Status: DC | PRN
  Administered 2016-11-18: 200 mg via INTRAVENOUS

## 2016-11-18 MED ORDER — PHENYLEPHRINE HCL 10 MG/ML IJ SOLN
INTRAMUSCULAR | Status: DC | PRN
  Administered 2016-11-18 (×3): 50 ug via INTRAVENOUS

## 2016-11-18 SURGICAL SUPPLY — 28 items
BLADE SURG 15 STRL SS SAFETY (BLADE) ×3 IMPLANT
BRIEF STRETCH MATERNITY 2XLG (MISCELLANEOUS) ×3 IMPLANT
CANISTER SUCT 1200ML W/VALVE (MISCELLANEOUS) ×3 IMPLANT
DRAPE LAPAROTOMY 77X122 PED (DRAPES) ×3 IMPLANT
DRAPE LEGGINS SURG 28X43 STRL (DRAPES) ×3 IMPLANT
DRAPE UNDER BUTTOCK W/FLU (DRAPES) ×3 IMPLANT
ELECT REM PT RETURN 9FT ADLT (ELECTROSURGICAL) ×3
ELECTRODE REM PT RTRN 9FT ADLT (ELECTROSURGICAL) ×1 IMPLANT
GAUZE SPONGE 4X4 12PLY STRL (GAUZE/BANDAGES/DRESSINGS) ×3 IMPLANT
GLOVE BIO SURGEON STRL SZ7 (GLOVE) ×15 IMPLANT
GOWN STRL REUS W/ TWL LRG LVL3 (GOWN DISPOSABLE) ×3 IMPLANT
GOWN STRL REUS W/TWL LRG LVL3 (GOWN DISPOSABLE) ×6
KIT RM TURNOVER STRD PROC AR (KITS) ×3 IMPLANT
LABEL OR SOLS (LABEL) ×3 IMPLANT
LIGASURE IMPACT 36 18CM CVD LR (INSTRUMENTS) ×3 IMPLANT
NEEDLE HYPO 25X1 1.5 SAFETY (NEEDLE) ×3 IMPLANT
NS IRRIG 500ML POUR BTL (IV SOLUTION) ×3 IMPLANT
PACK BASIN MINOR ARMC (MISCELLANEOUS) ×3 IMPLANT
PAD OB MATERNITY 4.3X12.25 (PERSONAL CARE ITEMS) ×3 IMPLANT
PAD PREP 24X41 OB/GYN DISP (PERSONAL CARE ITEMS) ×3 IMPLANT
SOL PREP PVP 2OZ (MISCELLANEOUS) ×3
SOLUTION PREP PVP 2OZ (MISCELLANEOUS) ×1 IMPLANT
SURGILUBE 2OZ TUBE FLIPTOP (MISCELLANEOUS) ×3 IMPLANT
SUT MNCRL 3-0 UNDYED SH (SUTURE) ×1 IMPLANT
SUT MONOCRYL 3-0 UNDYED (SUTURE) ×2
SUT VIC AB 3-0 SH 27 (SUTURE) ×4
SUT VIC AB 3-0 SH 27X BRD (SUTURE) ×2 IMPLANT
SYR CONTROL 10ML (SYRINGE) ×3 IMPLANT

## 2016-11-18 NOTE — Interval H&P Note (Signed)
History and Physical Interval Note:  11/18/2016 10:08 AM  Joe Adams  has presented today for surgery, with the diagnosis of screening  The various methods of treatment have been discussed with the patient and family. After consideration of risks, benefits and other options for treatment, the patient has consented to  Procedure(s): COLONOSCOPY WITH PROPOFOL (N/A) as a surgical intervention .  The patient's history has been reviewed, patient examined, no change in status, stable for surgery.  I have reviewed the patient's chart and labs.  Questions were answered to the patient's satisfaction.     Thersa Mohiuddin G

## 2016-11-18 NOTE — Anesthesia Post-op Follow-up Note (Cosign Needed)
Anesthesia QCDR form completed.        

## 2016-11-18 NOTE — Op Note (Signed)
Preop diagnosis: Symptomatic internal/external hemorrhoid                              Average risk for colorectal malignancy  Post op diagnosis: Symptomatic internal and external hemorrhoid and normal colonoscopy  Operation: Colonoscopy and internal/external hemorrhoidectomy and 3:00 location  Surgeon: Mckinley Jewel  Assistant:     Anesthesia: Gen.  Complications: None  EBL: Minimal  Drains: None  Description: This patient was put to sleep with an LMA and then placed in the lithotomy position the operating table. Patient had a very prominent 2 cm external hemorrhoid with an associated small internal hemorrhoid at the 3:00 location. Colonoscopy is completed first. After timeout performed colonoscope was introduced into the rectum and advanced all the way to the ileocecal valve identified both by visualization and by pressure over the right lower quadrant. The entire examined colon was normal with no polyps diverticula or other abnormal findings.. Scope was withdrawn gradually inspecting the ligament carefully on the way out. Following the colonoscopy the anal area was prepped and draped out as a sterile field using Betadine as a prep. The external hemorrhoid adequately exposed along with this internal component. The base of the external hemorrhoid was incised the with a scalpel and then the hemorrhoid was taken down with the use of the LigaSure device all the way through the external/internal component. The bleeding points were cauterized and subsequently a 3-0 Vicryl stitch was used to close the mucosa on the inner aspect leaving the skin part open. Further inspection of the anal canal all around showed no other hemorrhoid of concern couple of small internal hemorrhoids identified but seemed to be uninvolved. The rectum in  submucous, intersphincteric and the subcutaneous area was infiltrated with the 20 mL Exparel mixed with 20 mL of saline. The area was then covered with a copious amount of  bacitracin ointment and dressed. Patient subsequently extubated and returned recovery room stable condition

## 2016-11-18 NOTE — Brief Op Note (Signed)
Colonoscopy performed by Dr. Jamal Collin prior to hemorrhoidectomy in OR 7.

## 2016-11-18 NOTE — Discharge Instructions (Signed)
AMBULATORY SURGERY  DISCHARGE INSTRUCTIONS   1) The drugs that you were given will stay in your system until tomorrow so for the next 24 hours you should not:  A) Drive an automobile B) Make any legal decisions C) Drink any alcoholic beverage   2) You may resume regular meals tomorrow.  Today it is better to start with liquids and gradually work up to solid foods.  You may eat anything you prefer, but it is better to start with liquids, then soup and crackers, and gradually work up to solid foods.   3) Please notify your doctor immediately if you have any unusual bleeding, trouble breathing, redness and pain at the surgery site, drainage, fever, or pain not relieved by medication.  Surgical Procedures for Hemorrhoids, Care After Refer to this sheet in the next few weeks. These instructions provide you with information about caring for yourself after your procedure. Your health care provider may also give you more specific instructions. Your treatment has been planned according to current medical practices, but problems sometimes occur. Call your health care provider if you have any problems or questions after your procedure. What can I expect after the procedure? After the procedure, it is common to have:  Rectal pain.  Pain when you are having a bowel movement.  Slight rectal bleeding. Follow these instructions at home: Medicines   Take over-the-counter and prescription medicines only as told by your health care provider.  Do not drive or operate heavy machinery while taking prescription pain medicine.  Use a stool softener or a bulk laxative as told by your health care provider. Activity   Rest at home. Return to your normal activities as told by your health care provider.  Do not lift anything that is heavier than 10 lb (4.5 kg).  Do not sit for long periods of time. Take a walk every day or as told by your health care provider.  Do not strain to have a bowel movement.  Do not spend a long time sitting on the toilet. Eating and drinking   Eat foods that contain fiber, such as whole grains, beans, nuts, fruits, and vegetables.  Drink enough fluid to keep your urine clear or pale yellow. General instructions   Sit in a warm bath 2-3 times per day to relieve soreness or itching.  Keep all follow-up visits as told by your health care provider. This is important. Contact a health care provider if:  Your pain medicine is not helping.  You have a fever or chills.  You become constipated.  You have trouble passing urine. Get help right away if:  You have very bad rectal pain.  You have heavy bleeding from your rectum. This information is not intended to replace advice given to you by your health care provider. Make sure you discuss any questions you have with your health care provider. Document Released: 08/29/2003 Document Revised: 11/14/2015 Document Reviewed: 09/03/2014 Elsevier Interactive Patient Education  2017 [employer].

## 2016-11-18 NOTE — Anesthesia Procedure Notes (Signed)
Procedure Name: LMA Insertion Performed by: Shamyia Grandpre Pre-anesthesia Checklist: Patient identified, Patient being monitored, Timeout performed, Emergency Drugs available and Suction available Patient Re-evaluated:Patient Re-evaluated prior to inductionOxygen Delivery Method: Circle system utilized Preoxygenation: Pre-oxygenation with 100% oxygen Intubation Type: IV induction LMA: LMA inserted LMA Size: 4.5 Tube type: Oral Number of attempts: 1 Placement Confirmation: positive ETCO2 and breath sounds checked- equal and bilateral Tube secured with: Tape Dental Injury: Teeth and Oropharynx as per pre-operative assessment        

## 2016-11-18 NOTE — Anesthesia Preprocedure Evaluation (Signed)
Anesthesia Evaluation  Patient identified by MRN, date of birth, ID band Patient awake    Reviewed: Allergy & Precautions, H&P , NPO status , Patient's Chart, lab work & pertinent test results, reviewed documented beta blocker date and time   Airway Mallampati: II  TM Distance: >3 FB Neck ROM: full    Dental  (+) Teeth Intact   Pulmonary neg pulmonary ROS,    Pulmonary exam normal        Cardiovascular Exercise Tolerance: Good (-) angina+ Past MI  negative cardio ROS Normal cardiovascular exam Rhythm:regular Rate:Normal     Neuro/Psych  Headaches, negative neurological ROS  negative psych ROS   GI/Hepatic negative GI ROS, Neg liver ROS, GERD  Medicated,  Endo/Other  negative endocrine ROS  Renal/GU negative Renal ROS  negative genitourinary   Musculoskeletal   Abdominal   Peds  Hematology negative hematology ROS (+)   Anesthesia Other Findings Past Medical History: 2005: Acute MI ([place]) No date: GERD (gastroesophageal reflux disease) No date: Headache No date: History of kidney stones No date: Thyroid disease Past Surgical History: No date: TONSILLECTOMY     Comment: as a child BMI    Body Mass Index:  32.28 kg/m     Reproductive/Obstetrics negative OB ROS                             Anesthesia Physical Anesthesia Plan  ASA: II  Anesthesia Plan: General ETT   Post-op Pain Management:    Induction:   Airway Management Planned:   Additional Equipment:   Intra-op Plan:   Post-operative Plan:   Informed Consent: I have reviewed the patients History and Physical, chart, labs and discussed the procedure including the risks, benefits and alternatives for the proposed anesthesia with the patient or authorized representative who has indicated his/her understanding and acceptance.   Dental Advisory Given  Plan Discussed with: CRNA  Anesthesia Plan Comments:          Anesthesia Quick Evaluation

## 2016-11-18 NOTE — Anesthesia Postprocedure Evaluation (Signed)
Anesthesia Post Note  Patient: TYSEAN VANDERVLIET  Procedure(s) Performed: Procedure(s) (LRB): COLONOSCOPY IN O.R (N/A) HEMORRHOIDECTOMY (N/A)  Patient location during evaluation: PACU Anesthesia Type: General Level of consciousness: awake and alert and oriented Pain management: pain level controlled Vital Signs Assessment: post-procedure vital signs reviewed and stable Respiratory status: spontaneous breathing, nonlabored ventilation and respiratory function stable Cardiovascular status: blood pressure returned to baseline and stable Postop Assessment: no signs of nausea or vomiting Anesthetic complications: no     Last Vitals:  Vitals:   11/18/16 1252 11/18/16 1308  BP: 112/79 117/88  Pulse: 81 85  Resp: 17 17  Temp:      Last Pain:  Vitals:   11/18/16 1308  TempSrc:   PainSc: 0-No pain                 Nadina Fomby

## 2016-11-18 NOTE — H&P (View-Only) (Signed)
Patient ID: Joe Adams, male   DOB: 03/24/1961, 56 y.o.   MRN: [id number]  Chief Complaint  Patient presents with  . Other    hemorrhoid    HPI Joe Adams is a 56 y.o. male.  Here today for evaluation of hemorrhoids. He states he started having rectal pain about one month ago. He states he has had the hemorrhoid for 20 years. Bowels move daily, no bleeding. He has been using OTC hemorrhoid cream which has helped some.  HPI  Past Medical History:  Diagnosis Date  . Acute MI ([place])   . Thyroid disease     No past surgical history on file.  No family history on file.  Social History Social History  Substance Use Topics  . Smoking status: Never Smoker  . Smokeless tobacco: Never Used  . Alcohol use Yes     Comment: 2/week    No Known Allergies  Current Outpatient Prescriptions  Medication Sig Dispense Refill  . ibuprofen (ADVIL,MOTRIN) 200 MG tablet Take 200 mg by mouth every 6 (six) hours as needed.    Marland Kitchen levothyroxine (SYNTHROID, LEVOTHROID) 25 MCG tablet Take by mouth.     No current facility-administered medications for this visit.     Review of Systems Review of Systems  Constitutional: Negative.   Respiratory: Negative.   Cardiovascular: Negative.     Blood pressure 136/72, pulse 60, resp. rate 14, height 5\' 6"  (1.676 m), weight 200 lb (90.7 kg).  Physical Exam Physical Exam  Constitutional: He is oriented to person, place, and time. He appears well-developed and well-nourished.  Eyes: Conjunctivae are normal. No scleral icterus.  Neck: Neck supple.  Cardiovascular: Normal rate, regular rhythm and normal heart sounds.   Pulmonary/Chest: Effort normal and breath sounds normal.  Abdominal: Soft. Normal appearance and bowel sounds are normal. There is no hepatomegaly. There is no tenderness. A hernia (Small umbilical hernia) is present.  Genitourinary: Rectal exam shows external hemorrhoid. Rectal exam shows anal tone normal.     Lymphadenopathy:   He has no cervical adenopathy.  Neurological: He is alert and oriented to person, place, and time.  Skin: Skin is warm and dry.    Data Reviewed Notes reviewed   Assessment    Long standing history of hemorrhoid in same location.This is best dealt with by hemorrhoidectomy. Pt has not had a colonoscopy- has been reluctant to have one. Discussed fully with pt. Recommended colonoscopy and hemorrhoidectomy at same time.Procedure explained, as also  risks and benefits. He is agreeable.  Plan    Schedule colonoscopy and hemorrhoidectomy  HPI, Physical Exam, Assessment and Plan have been scribed under the direction and in the presence of Mckinley Jewel, MD  Gaspar Cola, CMA     I have completed the exam and reviewed the above documentation for accuracy and completeness.  I agree with the above.  Haematologist has been used and any errors in dictation or transcription are unintentional.  Seeplaputhur G. Jamal Collin, M.D., F.A.C.S.    Junie Panning G 11/12/2016, 2:39 PM   Patient has been scheduled for a colonoscopy and hemorrhoidectomy on 11-18-16 at [facility]. Miralax prescription has been sent in to the patient's pharmacy today. Colonoscopy instructions have been reviewed with the patient. This patient is aware to call the office if they have further questions.   Dominga Ferry, CMA

## 2016-11-18 NOTE — Transfer of Care (Signed)
Immediate Anesthesia Transfer of Care Note  Patient: Joe Adams  Procedure(s) Performed: Procedure(s): COLONOSCOPY IN O.R (N/A) HEMORRHOIDECTOMY (N/A)  Patient Location: PACU  Anesthesia Type:General  Level of Consciousness: sedated  Airway & Oxygen Therapy: Patient Spontanous Breathing and Patient connected to face mask oxygen  Post-op Assessment: Report given to RN and Post -op Vital signs reviewed and stable  Post vital signs: Reviewed and stable  Last Vitals:  Vitals:   11/18/16 1222 11/18/16 1223  BP: 106/70 106/70  Pulse: 76 74  Resp: 20 (!) 26  Temp: 36.3 C     Last Pain:  Vitals:   11/18/16 0938  TempSrc: Oral         Complications: No apparent anesthesia complications

## 2016-11-19 ENCOUNTER — Encounter: Payer: Self-pay | Admitting: General Surgery

## 2016-11-19 LAB — SURGICAL PATHOLOGY

## 2016-11-25 ENCOUNTER — Ambulatory Visit (INDEPENDENT_AMBULATORY_CARE_PROVIDER_SITE_OTHER): Payer: BLUE CROSS/BLUE SHIELD | Admitting: General Surgery

## 2016-11-25 ENCOUNTER — Encounter: Payer: Self-pay | Admitting: General Surgery

## 2016-11-25 VITALS — BP 132/74 | HR 76 | Resp 12 | Ht 64.0 in | Wt 193.0 lb

## 2016-11-25 DIAGNOSIS — K644 Residual hemorrhoidal skin tags: Secondary | ICD-10-CM

## 2016-11-25 NOTE — Patient Instructions (Addendum)
Return in one month. Stay off work for at least another week.

## 2016-11-25 NOTE — Progress Notes (Signed)
Patient ID: Joe Adams, male   DOB: Aug 31, 1960, 56 y.o.   MRN: [id number]  Chief Complaint  Patient presents with  . Routine Post Op    hemorrhoidectomy    HPI Joe Adams is a 56 y.o. malehere today for his post op hemorrhoidectomy done on 11/18/2016. Patient states he is bleeding with bowel movement, continues to have pain at surgical site. Marland KitchenHPI  Past Medical History:  Diagnosis Date  . Acute MI ([place]) 2005  . GERD (gastroesophageal reflux disease)   . Headache   . Hernia, umbilical   . History of kidney stones   . Thyroid disease     Past Surgical History:  Procedure Laterality Date  . COLONOSCOPY N/A 11/18/2016   Procedure: COLONOSCOPY IN O.R;  Surgeon: Christene Lye, MD;  Location: [facility] ORS;  Service: General;  Laterality: N/A;  . COLONOSCOPY WITH PROPOFOL N/A 11/18/2016   Procedure: COLONOSCOPY WITH PROPOFOL;  Surgeon: Christene Lye, MD;  Location: [facility] ENDOSCOPY;  Service: Endoscopy;  Laterality: N/A;  . HEMORRHOID SURGERY N/A 11/18/2016   Procedure: HEMORRHOIDECTOMY;  Surgeon: Christene Lye, MD;  Location: [facility] ORS;  Service: General;  Laterality: N/A;  . TONSILLECTOMY     as a child    No family history on file.  Social History Social History  Substance Use Topics  . Smoking status: Never Smoker  . Smokeless tobacco: Never Used  . Alcohol use 2.4 oz/week    4 Cans of beer per week     Comment: monthly    No Known Allergies  Current Outpatient Prescriptions  Medication Sig Dispense Refill  . ibuprofen (ADVIL,MOTRIN) 200 MG tablet Take 400 mg by mouth every 8 (eight) hours as needed (for pain).     Marland Kitchen levothyroxine (SYNTHROID, LEVOTHROID) 50 MCG tablet Take 50 mcg by mouth daily before breakfast. (typically before 12 pm)    . oxyCODONE-acetaminophen (ROXICET) 5-325 MG tablet Take 1 tablet by mouth every 4 (four) hours as needed for severe pain. 20 tablet 0  . polyethylene glycol powder (GLYCOLAX/MIRALAX) powder 255 grams one bottle  for colonoscopy prep 255 g 0  . Skin Protectants, Misc. (DRY SKIN EX) Apply 1 application topically 3 (three) times daily as needed (for rash/irritated skin between legs/thighs).     No current facility-administered medications for this visit.     Review of Systems Review of Systems  Constitutional: Negative.   Respiratory: Negative.   Cardiovascular: Negative.     Blood pressure 132/74, pulse 76, resp. rate 12, height 5\' 4"  (1.626 m), weight 193 lb (87.5 kg).  Physical Exam Physical Exam  Constitutional: He is oriented to person, place, and time. He appears well-developed and well-nourished.  Eyes: Conjunctivae are normal. No scleral icterus.  Pulmonary/Chest: Effort normal.  Genitourinary:     Neurological: He is alert and oriented to person, place, and time.  Skin: Skin is warm and dry.  Psychiatric: He has a normal mood and affect. His behavior is normal.    Data Reviewed Prior notes reviewed   Assessment    Status post hemorrhoidectomy- 1 week f/u, open area at 9 o'clock position, continue using neosporin and sitz baths. Colonoscopy performed on 11/18/16-normal  Plan    Follow up in 2 weeks. Advised to call with any questions or concerns.  HPI, Physical Exam, Assessment and Plan have been scribed under the direction and in the presence of Mckinley Jewel, MD  Gaspar Cola, CMA     I have completed the exam and  reviewed the above documentation for accuracy and completeness.  I agree with the above.  Haematologist has been used and any errors in dictation or transcription are unintentional.  Seeplaputhur G. Jamal Collin, M.D., F.A.C.S.  Junie Panning G 11/25/2016, 1:35 PM

## 2016-12-09 ENCOUNTER — Ambulatory Visit (INDEPENDENT_AMBULATORY_CARE_PROVIDER_SITE_OTHER): Payer: BLUE CROSS/BLUE SHIELD | Admitting: General Surgery

## 2016-12-09 ENCOUNTER — Encounter: Payer: Self-pay | Admitting: General Surgery

## 2016-12-09 VITALS — BP 132/72 | HR 74 | Resp 12 | Ht 66.0 in | Wt 198.0 lb

## 2016-12-09 DIAGNOSIS — K644 Residual hemorrhoidal skin tags: Secondary | ICD-10-CM

## 2016-12-09 NOTE — Patient Instructions (Signed)
Return in one month.  

## 2016-12-09 NOTE — Progress Notes (Signed)
Patient ID: Joe Adams, male   DOB: Jul 21, 1960, 56 y.o.   MRN: [id number]  Chief Complaint  Patient presents with  . Follow-up    HPI Joe Adams is a 56 y.o. male today for his post op hemorrhoidectomy done on 11/18/2016. Patient states he still bleeds when moving his bowels but reports improvement. No other complaints other than itching at site of surgery. HPI  Past Medical History:  Diagnosis Date  . Acute MI ([place]) 2005  . GERD (gastroesophageal reflux disease)   . Headache   . Hernia, umbilical   . History of kidney stones   . Thyroid disease     Past Surgical History:  Procedure Laterality Date  . COLONOSCOPY N/A 11/18/2016   Procedure: COLONOSCOPY IN O.R;  Surgeon: Christene Lye, MD;  Location: [facility] ORS;  Service: General;  Laterality: N/A;  . COLONOSCOPY WITH PROPOFOL N/A 11/18/2016   Procedure: COLONOSCOPY WITH PROPOFOL;  Surgeon: Christene Lye, MD;  Location: [facility];  Service: Endoscopy;  Laterality: N/A;  . HEMORRHOID SURGERY N/A 11/18/2016   Procedure: HEMORRHOIDECTOMY;  Surgeon: Christene Lye, MD;  Location: [facility] ORS;  Service: General;  Laterality: N/A;  . TONSILLECTOMY     as a child    No family history on file.  Social History Social History  Substance Use Topics  . Smoking status: Never Smoker  . Smokeless tobacco: Never Used  . Alcohol use 2.4 oz/week    4 Cans of beer per week     Comment: monthly    No Known Allergies  Current Outpatient Prescriptions  Medication Sig Dispense Refill  . ibuprofen (ADVIL,MOTRIN) 200 MG tablet Take 400 mg by mouth every 8 (eight) hours as needed (for pain).     Marland Kitchen levothyroxine (SYNTHROID, LEVOTHROID) 50 MCG tablet Take 50 mcg by mouth daily before breakfast. (typically before 12 pm)    . oxyCODONE-acetaminophen (ROXICET) 5-325 MG tablet Take 1 tablet by mouth every 4 (four) hours as needed for severe pain. 20 tablet 0  . polyethylene glycol powder (GLYCOLAX/MIRALAX) powder 255  grams one bottle for colonoscopy prep 255 g 0  . Skin Protectants, Misc. (DRY SKIN EX) Apply 1 application topically 3 (three) times daily as needed (for rash/irritated skin between legs/thighs).     No current facility-administered medications for this visit.     Review of Systems Review of Systems  Constitutional: Negative.   Respiratory: Negative.   Cardiovascular: Negative.     Blood pressure 132/72, pulse 74, resp. rate 12, height 5\' 6"  (1.676 m), weight 198 lb (89.8 kg).  Physical Exam Physical Exam  Constitutional: He is oriented to person, place, and time. He appears well-developed and well-nourished.  Genitourinary:     Neurological: He is alert and oriented to person, place, and time.  Skin: Skin is warm and dry.    Data Reviewed Prior notes reviewed   Assessment    Status post hemorrhoidectomy- 3 week f/u, open site at 9 o'clock position, surgery site clean and healing well. Continue neosporin and sitz baths as needed. Colonoscopy on 11/18/16 was normal. Stable exam     Plan    Patient to return in one month. Advised to call office with questions or concerns.    HPI, Physical Exam, Assessment and Plan have been scribed under the direction and in the presence of Mckinley Jewel, MD  Gaspar Cola, CMA  I have completed the exam and reviewed the above documentation for accuracy and completeness.  I agree  with the above.  Haematologist has been used and any errors in dictation or transcription are unintentional.  Issaiah Seabrooks G. Jamal Collin, M.D., F.A.C.S.   Junie Panning G 12/09/2016, 3:13 PM

## 2017-01-11 ENCOUNTER — Ambulatory Visit (INDEPENDENT_AMBULATORY_CARE_PROVIDER_SITE_OTHER): Payer: BLUE CROSS/BLUE SHIELD | Admitting: General Surgery

## 2017-01-11 VITALS — BP 130/78 | HR 80 | Resp 14 | Ht 67.0 in | Wt 204.0 lb

## 2017-01-11 DIAGNOSIS — K644 Residual hemorrhoidal skin tags: Secondary | ICD-10-CM

## 2017-01-11 NOTE — Progress Notes (Signed)
Patient ID: Joe Adams, male   DOB: [birth year], 56 y.o.   MRN: [id number]  Chief Complaint  Patient presents with  . Follow-up    HPI Joe Adams is a 56 y.o. male here today for his one month follow up hemorrhoids.Patient states he is doing well. Little bleeding when wrapping.  HPI  Past Medical History:  Diagnosis Date  . Acute MI ([place]) 2005  . GERD (gastroesophageal reflux disease)   . Headache   . Hernia, umbilical   . History of kidney stones   . Thyroid disease     Past Surgical History:  Procedure Laterality Date  . COLONOSCOPY N/A 11/18/2016   Procedure: COLONOSCOPY IN O.R;  Surgeon: Christene Lye, MD;  Location: [facility] ORS;  Service: General;  Laterality: N/A;  . COLONOSCOPY WITH PROPOFOL N/A 11/18/2016   Procedure: COLONOSCOPY WITH PROPOFOL;  Surgeon: Christene Lye, MD;  Location: [facility];  Service: Endoscopy;  Laterality: N/A;  . HEMORRHOID SURGERY N/A 11/18/2016   Procedure: HEMORRHOIDECTOMY;  Surgeon: Christene Lye, MD;  Location: [facility] ORS;  Service: General;  Laterality: N/A;  . TONSILLECTOMY     as a child    No family history on file.  Social History Social History  Substance Use Topics  . Smoking status: Never Smoker  . Smokeless tobacco: Never Used  . Alcohol use 2.4 oz/week    4 Cans of beer per week     Comment: monthly    No Known Allergies  Current Outpatient Prescriptions  Medication Sig Dispense Refill  . ibuprofen (ADVIL,MOTRIN) 200 MG tablet Take 400 mg by mouth every 8 (eight) hours as needed (for pain).     Marland Kitchen levothyroxine (SYNTHROID, LEVOTHROID) 50 MCG tablet Take 50 mcg by mouth daily before breakfast. (typically before 12 pm)    . oxyCODONE-acetaminophen (ROXICET) 5-325 MG tablet Take 1 tablet by mouth every 4 (four) hours as needed for severe pain. 20 tablet 0  . polyethylene glycol powder (GLYCOLAX/MIRALAX) powder 255 grams one bottle for colonoscopy prep 255 g 0  . Skin Protectants, Misc. (DRY SKIN  EX) Apply 1 application topically 3 (three) times daily as needed (for rash/irritated skin between legs/thighs).     No current facility-administered medications for this visit.     Review of Systems Review of Systems  Constitutional: Negative.   Respiratory: Negative.   Cardiovascular: Negative.     Blood pressure 130/78, pulse 80, resp. rate 14, height 5\' 7"  (1.702 m), weight 204 lb (92.5 kg).  Physical Exam Physical Exam  Constitutional: He is oriented to person, place, and time. He appears well-developed and well-nourished.  Neurological: He is alert and oriented to person, place, and time.  Skin: Skin is dry.  Rectal Exam: previous hemorrhoidectomy site shows complete healing, no new findings.   Data Reviewed Prior notes reviewed   Assessment    Post-op hemorrhoidectomy - fully healed, no further complaints     Plan    Patient to return as needed. The patient is aware to call back for any questions or concerns.    I have completed the exam and reviewed the above documentation for accuracy and completeness.  I agree with the above.  Haematologist has been used and any errors in dictation or transcription are unintentional.  Seeplaputhur G. Jamal Collin, M.D., F.A.C.S.    Junie Panning G 01/11/2017, 4:05 PM

## 2017-01-11 NOTE — Patient Instructions (Signed)
Patient to return as needed. The patient is aware to call back for any questions or concerns. 

## 2017-09-02 ENCOUNTER — Ambulatory Visit
Admission: EM | Admit: 2017-09-02 | Discharge: 2017-09-02 | Discharge status: Against Medical Advice | Payer: BLUE CROSS/BLUE SHIELD | Attending: Family Medicine | Admitting: Family Medicine

## 2017-09-02 ENCOUNTER — Encounter: Payer: Self-pay | Admitting: Emergency Medicine

## 2017-09-02 DIAGNOSIS — Z79890 Hormone replacement therapy (postmenopausal): Secondary | ICD-10-CM | POA: Insufficient documentation

## 2017-09-02 DIAGNOSIS — R110 Nausea: Secondary | ICD-10-CM | POA: Diagnosis not present

## 2017-09-02 DIAGNOSIS — R0602 Shortness of breath: Secondary | ICD-10-CM | POA: Diagnosis not present

## 2017-09-02 DIAGNOSIS — R072 Precordial pain: Secondary | ICD-10-CM

## 2017-09-02 DIAGNOSIS — E079 Disorder of thyroid, unspecified: Secondary | ICD-10-CM | POA: Diagnosis not present

## 2017-09-02 DIAGNOSIS — R079 Chest pain, unspecified: Secondary | ICD-10-CM | POA: Diagnosis present

## 2017-09-02 MED ORDER — ASPIRIN 81 MG PO CHEW
324.0000 mg | CHEWABLE_TABLET | Freq: Once | ORAL | Status: AC
  Administered 2017-09-02: 324 mg via ORAL

## 2017-09-02 NOTE — Discharge Instructions (Signed)
Please go to the hospital.  You need a cardiac work up.  Take a daily aspirin.  Please be sure to see your doctor.  Take care  Dr. Lacinda Axon

## 2017-09-02 NOTE — ED Notes (Signed)
Bed: MUC05 Expected date:  Expected time:  Means of arrival:  Comments: treatement

## 2017-09-02 NOTE — ED Triage Notes (Signed)
Patient states he has been having some chest pain since last, some nausea, and fatigue.

## 2017-09-02 NOTE — ED Provider Notes (Signed)
[facility]    CSN: [id number] Arrival date & time: 09/02/17  1211  History   Chief Complaint Chief Complaint  Patient presents with  . Chest Pain   HPI  57 year old male with a history of MI presents with chest pain.  Patient reports he has been experience intermittent chest pain since yesterday.  He states that his first bout of chest pain occurred at work.  He states that he was not exerting himself.  Chest pain has continued intermittently since then.  He states that it is located in the center of his chest.  Lasts for minutes and then resolves.  Associated nausea.  Associated shortness of breath.  Denies diaphoresis.  Patient states that he is just fatigued and been working more and that is the culprit of his symptoms.  He is not on aspirin.  Patient states that he was on blood thinners for quite a few years.  He states that he is not particular compliant with his thyroid medication either.  No known relieving factors.  No other associated symptoms.  No other complaints.  Past Medical History:  Diagnosis Date  . Acute MI ([place]) 2005  . GERD (gastroesophageal reflux disease)   . Headache   . Hernia, umbilical   . History of kidney stones   . Thyroid disease    Past Surgical History:  Procedure Laterality Date  . COLONOSCOPY N/A 11/18/2016   Procedure: COLONOSCOPY IN O.R;  Surgeon: Christene Lye, MD;  Location: [facility] ORS;  Service: General;  Laterality: N/A;  . COLONOSCOPY WITH PROPOFOL N/A 11/18/2016   Procedure: COLONOSCOPY WITH PROPOFOL;  Surgeon: Christene Lye, MD;  Location: [facility] ENDOSCOPY;  Service: Endoscopy;  Laterality: N/A;  . HEMORRHOID SURGERY N/A 11/18/2016   Procedure: HEMORRHOIDECTOMY;  Surgeon: Christene Lye, MD;  Location: [facility] ORS;  Service: General;  Laterality: N/A;  . TONSILLECTOMY     as a child   Home Medications    Prior to Admission medications   Medication Sig Start Date End Date Taking? Authorizing Provider    levothyroxine (SYNTHROID, LEVOTHROID) 50 MCG tablet Take 50 mcg by mouth daily before breakfast. (typically before 12 pm)   Yes [provider]  ibuprofen (ADVIL,MOTRIN) 200 MG tablet Take 400 mg by mouth every 8 (eight) hours as needed (for pain).     [provider]  Skin Protectants, Misc. (DRY SKIN EX) Apply 1 application topically 3 (three) times daily as needed (for rash/irritated skin between legs/thighs).    [provider]    Family History Family History  Problem Relation Age of Onset  . Cancer Other   . Cancer Paternal Uncle     Social History Social History   Tobacco Use  . Smoking status: Never Smoker  . Smokeless tobacco: Never Used  Substance Use Topics  . Alcohol use: Yes    Alcohol/week: 2.4 oz    Types: 4 Cans of beer per week    Comment: monthly  . Drug use: No     Allergies   Patient has no known allergies.   Review of Systems Review of Systems  Constitutional: Negative for diaphoresis.  Respiratory: Positive for shortness of breath.   Cardiovascular: Positive for chest pain.  Gastrointestinal: Positive for nausea.   Physical Exam Triage Vital Signs ED Triage Vitals  Enc Vitals Group     BP 09/02/17 1233 132/76     Pulse Rate 09/02/17 1233 66     Resp 09/02/17 1233 18  Temp 09/02/17 1233 97.9 F (36.6 C)     Temp Source 09/02/17 1233 Oral     SpO2 09/02/17 1233 97 %     Weight 09/02/17 1234 220 lb (99.8 kg)     Height 09/02/17 1234 5\' 6"  (1.676 m)     Head Circumference --      Peak Flow --      Pain Score 09/02/17 1234 7     Pain Loc --      Pain Edu? --      Excl. in [place]? --    Updated Vital Signs BP 132/76 (BP Location: Left Arm)   Pulse 66   Temp 97.9 F (36.6 C) (Oral)   Resp 18   Ht 5\' 6"  (1.676 m)   Wt 220 lb (99.8 kg)   SpO2 97%   BMI 35.51 kg/m     Physical Exam  Constitutional: He is oriented to person, place, and time. He appears well-developed. No distress.  Cardiovascular: Normal  rate and regular rhythm.  Pulmonary/Chest: Effort normal and breath sounds normal. He has no wheezes. He has no rales.  Abdominal: Soft. He exhibits no distension. There is no tenderness.  Umbilical hernia noted.  Neurological: He is alert and oriented to person, place, and time.  Psychiatric: He has a normal mood and affect. His behavior is normal.  Nursing note and vitals reviewed.  UC Treatments / Results  Labs (all labs ordered are listed, but only abnormal results are displayed) Labs Reviewed - No data to display  EKG Normal sinus rhythm with rate of 66.  No ST or T wave changes.  Normal intervals.  Radiology No results found.  Procedures Procedures (including critical care time)  Medications Ordered in UC Medications  aspirin chewable tablet 324 mg (324 mg Oral Given 09/02/17 1259)     Initial Impression / Assessment and Plan / UC Course  I have reviewed the triage vital signs and the nursing notes.  Pertinent labs & imaging results that were available during my care of the patient were reviewed by me and considered in my medical decision making (see chart for details).    57 year old male presents with chest pain.  Given his history as well as his past medical history, I have urged him that he should go to the hospital.  I have strongly advised him that he should go to the hospital.  My nursing staff has done the same.  Patient is leaving [place].  Aspirin was given here.  Final Clinical Impressions(s) / UC Diagnoses   Final diagnoses:  Precordial pain    ED Discharge Orders    None     Controlled Substance Prescriptions [place] Controlled Substance Registry consulted? Not Applicable   Coral Spikes, DO 09/02/17 1309

## 2020-02-02 ENCOUNTER — Ambulatory Visit: Payer: Self-pay | Admitting: Podiatry

## 2020-02-06 ENCOUNTER — Other Ambulatory Visit: Payer: Self-pay

## 2020-02-06 ENCOUNTER — Ambulatory Visit: Payer: Self-pay | Admitting: Podiatry

## 2020-07-03 ENCOUNTER — Ambulatory Visit: Payer: BLUE CROSS/BLUE SHIELD | Admitting: Podiatry

## 2022-03-22 ENCOUNTER — Emergency Department: Payer: 59

## 2022-03-22 ENCOUNTER — Other Ambulatory Visit: Payer: Self-pay

## 2022-03-22 ENCOUNTER — Encounter: Payer: Self-pay | Admitting: Intensive Care

## 2022-03-22 ENCOUNTER — Emergency Department
Admission: EM | Admit: 2022-03-22 | Discharge: 2022-03-22 | Disposition: A | Discharge status: Home / Self Care | Payer: 59 | Attending: Emergency Medicine | Admitting: Emergency Medicine

## 2022-03-22 DIAGNOSIS — E119 Type 2 diabetes mellitus without complications: Secondary | ICD-10-CM | POA: Diagnosis not present

## 2022-03-22 DIAGNOSIS — R109 Unspecified abdominal pain: Secondary | ICD-10-CM | POA: Insufficient documentation

## 2022-03-22 DIAGNOSIS — R160 Hepatomegaly, not elsewhere classified: Secondary | ICD-10-CM

## 2022-03-22 DIAGNOSIS — E039 Hypothyroidism, unspecified: Secondary | ICD-10-CM | POA: Insufficient documentation

## 2022-03-22 HISTORY — DX: Type 2 diabetes mellitus without complications: E11.9

## 2022-03-22 LAB — URINALYSIS, ROUTINE W REFLEX MICROSCOPIC
Bilirubin Urine: NEGATIVE
Glucose, UA: NEGATIVE mg/dL
Hgb urine dipstick: NEGATIVE
Ketones, ur: NEGATIVE mg/dL
Leukocytes,Ua: NEGATIVE
Nitrite: NEGATIVE
Protein, ur: NEGATIVE mg/dL
Specific Gravity, Urine: 1.02 (ref 1.005–1.030)
pH: 5 (ref 5.0–8.0)

## 2022-03-22 LAB — CBC WITH DIFFERENTIAL/PLATELET
Abs Immature Granulocytes: 0.07 10*3/uL (ref 0.00–0.07)
Basophils Absolute: 0 10*3/uL (ref 0.0–0.1)
Basophils Relative: 0 %
Eosinophils Absolute: 0 10*3/uL (ref 0.0–0.5)
Eosinophils Relative: 0 %
HCT: 38.1 % — ABNORMAL LOW (ref 39.0–52.0)
Hemoglobin: 12.2 g/dL — ABNORMAL LOW (ref 13.0–17.0)
Immature Granulocytes: 1 %
Lymphocytes Relative: 9 %
Lymphs Abs: 1.2 10*3/uL (ref 0.7–4.0)
MCH: 27.1 pg (ref 26.0–34.0)
MCHC: 32 g/dL (ref 30.0–36.0)
MCV: 84.7 fL (ref 80.0–100.0)
Monocytes Absolute: 1.1 10*3/uL — ABNORMAL HIGH (ref 0.1–1.0)
Monocytes Relative: 8 %
Neutro Abs: 10.6 10*3/uL — ABNORMAL HIGH (ref 1.7–7.7)
Neutrophils Relative %: 82 %
Platelets: 361 10*3/uL (ref 150–400)
RBC: 4.5 MIL/uL (ref 4.22–5.81)
RDW: 14.4 % (ref 11.5–15.5)
WBC: 12.9 10*3/uL — ABNORMAL HIGH (ref 4.0–10.5)
nRBC: 0 % (ref 0.0–0.2)

## 2022-03-22 LAB — COMPREHENSIVE METABOLIC PANEL
ALT: 22 U/L (ref 0–44)
AST: 36 U/L (ref 15–41)
Albumin: 3.6 g/dL (ref 3.5–5.0)
Alkaline Phosphatase: 196 U/L — ABNORMAL HIGH (ref 38–126)
Anion gap: 15 (ref 5–15)
BUN: 37 mg/dL — ABNORMAL HIGH (ref 8–23)
CO2: 23 mmol/L (ref 22–32)
Calcium: 9.3 mg/dL (ref 8.9–10.3)
Chloride: 100 mmol/L (ref 98–111)
Creatinine, Ser: 1.63 mg/dL — ABNORMAL HIGH (ref 0.61–1.24)
GFR, Estimated: 48 mL/min — ABNORMAL LOW (ref >60)
Glucose, Bld: 180 mg/dL — ABNORMAL HIGH (ref 70–99)
Potassium: 3.9 mmol/L (ref 3.5–5.1)
Sodium: 138 mmol/L (ref 135–145)
Total Bilirubin: 1.7 mg/dL — ABNORMAL HIGH (ref 0.3–1.2)
Total Protein: 8 g/dL (ref 6.5–8.1)

## 2022-03-22 LAB — TSH: TSH: 1.632 u[IU]/mL (ref 0.350–4.500)

## 2022-03-22 LAB — T4, FREE: Free T4: 1.14 ng/dL — ABNORMAL HIGH (ref 0.61–1.12)

## 2022-03-22 LAB — TROPONIN I (HIGH SENSITIVITY): Troponin I (High Sensitivity): 5 ng/L (ref <18)

## 2022-03-22 MED ORDER — OXYCODONE-ACETAMINOPHEN 5-325 MG PO TABS: 1.0000 | ORAL_TABLET | Freq: Four times a day (QID) | ORAL | 0 refills | Status: DC | PRN

## 2022-03-22 MED ORDER — GADOPICLENOL 0.5 MMOL/ML IV SOLN
7.0000 mL | Freq: Once | INTRAVENOUS | Status: AC | PRN
  Administered 2022-03-22: 10 mL via INTRAVENOUS

## 2022-03-22 MED ORDER — SODIUM CHLORIDE 0.9 % IV BOLUS
1000.0000 mL | Freq: Once | INTRAVENOUS | Status: AC
  Administered 2022-03-22: 1000 mL via INTRAVENOUS

## 2022-03-22 MED ORDER — IOHEXOL 350 MG/ML SOLN
75.0000 mL | Freq: Once | INTRAVENOUS | Status: AC | PRN
  Administered 2022-03-22: 75 mL via INTRAVENOUS

## 2022-03-22 MED ORDER — MORPHINE SULFATE (PF) 4 MG/ML IV SOLN
4.0000 mg | Freq: Once | INTRAVENOUS | Status: AC
  Administered 2022-03-22: 4 mg via INTRAVENOUS
  Filled 2022-03-22: qty 1

## 2022-03-22 MED ORDER — ONDANSETRON HCL 4 MG/2ML IJ SOLN
4.0000 mg | Freq: Once | INTRAMUSCULAR | Status: AC
  Administered 2022-03-22: 4 mg via INTRAVENOUS
  Filled 2022-03-22: qty 2

## 2022-03-22 NOTE — ED Provider Notes (Signed)
CT scans with concern for multiple liver lesions. Radiology thought cancer likely, infection less likely. Discussed with Dr. Tasia Catchings with oncology who recommended MRI to better evaluate. MRI is concerning for neoplasm. Discussed this finding with the patient. Dr. Tasia Catchings will help arrange outpatient follow up.   Nance Pear, MD 03/22/22 2025

## 2022-03-22 NOTE — Discharge Instructions (Signed)
Please seek medical attention for any high fevers, chest pain, shortness of breath, change in behavior, persistent vomiting, bloody stool or any other new or concerning symptoms.  

## 2022-03-22 NOTE — ED Notes (Signed)
Pt in MRI.

## 2022-03-22 NOTE — ED Triage Notes (Signed)
Patient c/o dizziness and right side pain that started last night. Reports for 6 months he will run a fever every 2-3 weeks for a few days and then it goes away. Also c/o unexpected weight loss in the last few months without trying.   Reports he has seen his PCP and they take blood work occasionally.

## 2022-03-22 NOTE — ED Notes (Signed)
Pt returned from MRI, requesting pain medication, secure chat message sent to dr. Archie Balboa to notify.

## 2022-03-22 NOTE — ED Provider Notes (Signed)
[facility] Provider Note    Event Date/Time   First MD Initiated Contact with Patient 03/22/22 1308     (approximate)   History   Dizziness   HPI  Joe Adams is a 61 y.o. male   Past medical history of CAD with acute MI, diabetes, GERD, kidney stones, hypothyroid on levothyroxine presents with approximately 6 months of symptoms including occasional right-sided lower chest/upper abdominal/flank pain, intermittent subjective fever and chills, night sweats, and approximately 20 to 30 pound weight loss over the last 6 months.  States that he has been to his primary doctor and had routine labs performed, as well as a course of antibiotics for unknown infection several months ago, but symptoms have continued.  He is here with his fiance, for further work-up, but denies any changes or acute worsening today.  On review of systems, he denies shortness of breath, trauma, diarrhea or in the stools. He does endorse occasional nausea, poor appetite, lightheadedness.  History was obtained via the patient and his fiance who is at bedside.      Physical Exam   Triage Vital Signs: ED Triage Vitals  Enc Vitals Group     BP 03/22/22 1145 109/75     Pulse Rate 03/22/22 1145 86     Resp 03/22/22 1145 16     Temp 03/22/22 1145 97.7 F (36.5 C)     Temp Source 03/22/22 1145 Oral     SpO2 03/22/22 1145 97 %     Weight 03/22/22 1147 162 lb (73.5 kg)     Height 03/22/22 1147 _0  (1.676 m)     Head Circumference --      Peak Flow --      Pain Score 03/22/22 1147 5     Pain Loc --      Pain Edu? --      Excl. in [place]? --     Most recent vital signs: Vitals:   03/22/22 1145 03/22/22 1400  BP: 109/75 (!) 105/92  Pulse: 86 74  Resp: 16 20  Temp: 97.7 F (36.5 C)   SpO2: 97% 97%    General: Awake, no distress.  Oriented pleasant and cooperative CV:  Good peripheral perfusion.  Resp:  Normal effort.  Lungs are clear to auscultation  bilaterally Abd:  No distention.  Right upper quadrant tenderness, no rigidity or guarding Other:  Neurologically intact with full motor and sensory, no facial asymmetry, extraocular movements intact, pupils equal round and reactive, normal finger-to-nose.   ED Results / Procedures / Treatments   Labs (all labs ordered are listed, but only abnormal results are displayed) Labs Reviewed  CBC WITH DIFFERENTIAL/PLATELET - Abnormal; Notable for the following components:      Result Value   WBC 12.9 (*)    Hemoglobin 12.2 (*)    HCT 38.1 (*)    Neutro Abs 10.6 (*)    Monocytes Absolute 1.1 (*)    All other components within normal limits  COMPREHENSIVE METABOLIC PANEL - Abnormal; Notable for the following components:   Glucose, Bld 180 (*)    BUN 37 (*)    Creatinine, Ser 1.63 (*)    Alkaline Phosphatase 196 (*)    Total Bilirubin 1.7 (*)    GFR, Estimated 48 (*)    All other components within normal limits  T4, FREE - Abnormal; Notable for the following components:   Free T4 1.14 (*)    All other components within normal limits  TSH  URINALYSIS, ROUTINE W REFLEX MICROSCOPIC  TROPONIN I (HIGH SENSITIVITY)  TROPONIN I (HIGH SENSITIVITY)     I reviewed labs and they are notable for a creatinine of 1.63 and mild leukocytosis 12.9, +alk phosp at 196  EKG  ED ECG REPORT I, Lucillie Garfinkel, the attending physician, personally viewed and interpreted this ECG.   Date: 03/22/2022  EKG Time: 1428  Rate: 84  Rhythm: normal EKG, normal sinus rhythm  Axis: normal  Intervals:none  ST&T Change: no ischemia    RADIOLOGY I independently reviewed and interpreted chest x-ray and see no obvious focal opacities or pneumothorax   PROCEDURES:  Critical Care performed: No  Procedures   MEDICATIONS ORDERED IN ED: Medications  sodium chloride 0.9 % bolus 1,000 mL (1,000 mLs Intravenous New Bag/Given 03/22/22 1400)  ondansetron (ZOFRAN) injection 4 mg (4 mg Intravenous Given 03/22/22 1401)   iohexol (OMNIPAQUE) 350 MG/ML injection 75 mL (75 mLs Intravenous Contrast Given 03/22/22 1458)    IMPRESSION / MDM / ASSESSMENT AND PLAN / ED COURSE  I reviewed the triage vital signs and the nursing notes.                              Differential diagnosis includes, but is not limited to, ACS, intra abdominal infection, resp infection, urine infection, PE, malignancy   The patient is on the cardiac monitor to evaluate for evidence of arrhythmia and/or significant heart rate changes.  MDM: Is a patient with many months of nondescript, vague terms inclusive of B symptoms night sweats, weight loss with some concerns for underlying malignancy.  In the emergency department would also like to rule out ACS, PE, infection.  We will obtain basic labs, urinalysis, chest x-ray infectious work-up and check metabolic derangements.  Also EKG and troponin for ACS.  CTA chest for PE, respiratory infection, malignancies as well as a CT abdomen pelvis for intra-abdominal infection.  Signed out to oncoming provider pending work-up as above, patient remains in stable condition.  Patient's presentation is most consistent with acute presentation with potential threat to life or bodily function.       FINAL CLINICAL IMPRESSION(S) / ED DIAGNOSES   Final diagnoses:  None     Rx / DC Orders   ED Discharge Orders     None        Note:  This document was prepared using Dragon voice recognition software and may include unintentional dictation errors.    Lucillie Garfinkel, MD 03/22/22 [phone]

## 2022-03-24 ENCOUNTER — Inpatient Hospital Stay: Payer: 59 | Attending: Oncology | Admitting: Oncology

## 2022-03-24 ENCOUNTER — Telehealth: Payer: Self-pay

## 2022-03-24 ENCOUNTER — Encounter: Payer: Self-pay | Admitting: Oncology

## 2022-03-24 ENCOUNTER — Inpatient Hospital Stay: Payer: 59

## 2022-03-24 VITALS — BP 115/81 | HR 87 | Resp 18 | Ht 66.0 in

## 2022-03-24 DIAGNOSIS — R160 Hepatomegaly, not elsewhere classified: Secondary | ICD-10-CM

## 2022-03-24 DIAGNOSIS — Z7189 Other specified counseling: Secondary | ICD-10-CM | POA: Insufficient documentation

## 2022-03-24 DIAGNOSIS — R109 Unspecified abdominal pain: Secondary | ICD-10-CM | POA: Insufficient documentation

## 2022-03-24 DIAGNOSIS — K5900 Constipation, unspecified: Secondary | ICD-10-CM | POA: Diagnosis not present

## 2022-03-24 DIAGNOSIS — R1031 Right lower quadrant pain: Secondary | ICD-10-CM

## 2022-03-24 DIAGNOSIS — G893 Neoplasm related pain (acute) (chronic): Secondary | ICD-10-CM | POA: Diagnosis not present

## 2022-03-24 DIAGNOSIS — K5909 Other constipation: Secondary | ICD-10-CM

## 2022-03-24 DIAGNOSIS — C8339 Diffuse large B-cell lymphoma, extranodal and solid organ sites: Secondary | ICD-10-CM | POA: Insufficient documentation

## 2022-03-24 DIAGNOSIS — R634 Abnormal weight loss: Secondary | ICD-10-CM | POA: Diagnosis not present

## 2022-03-24 LAB — HEPATIC FUNCTION PANEL
ALT: 18 U/L (ref 0–44)
AST: 24 U/L (ref 15–41)
Albumin: 3.4 g/dL — ABNORMAL LOW (ref 3.5–5.0)
Alkaline Phosphatase: 218 U/L — ABNORMAL HIGH (ref 38–126)
Bilirubin, Direct: 0.2 mg/dL (ref 0.0–0.2)
Indirect Bilirubin: 0.6 mg/dL (ref 0.3–0.9)
Total Bilirubin: 0.8 mg/dL (ref 0.3–1.2)
Total Protein: 7.9 g/dL (ref 6.5–8.1)

## 2022-03-24 LAB — HEPATITIS PANEL, ACUTE
HCV Ab: NONREACTIVE
Hep A IgM: NONREACTIVE
Hep B C IgM: NONREACTIVE
Hepatitis B Surface Ag: NONREACTIVE

## 2022-03-24 LAB — AMMONIA: Ammonia: 13 umol/L (ref 9–35)

## 2022-03-24 LAB — PROTIME-INR
INR: 1.2 (ref 0.8–1.2)
Prothrombin Time: 14.8 seconds (ref 11.4–15.2)

## 2022-03-24 LAB — IRON AND TIBC
Iron: 45 ug/dL (ref 45–182)
Saturation Ratios: 16 % — ABNORMAL LOW (ref 17.9–39.5)
TIBC: 286 ug/dL (ref 250–450)
UIBC: 241 ug/dL

## 2022-03-24 LAB — HIV ANTIBODY (ROUTINE TESTING W REFLEX): HIV Screen 4th Generation wRfx: NONREACTIVE

## 2022-03-24 LAB — LACTATE DEHYDROGENASE: LDH: 308 U/L — ABNORMAL HIGH (ref 98–192)

## 2022-03-24 LAB — APTT: aPTT: 38 seconds — ABNORMAL HIGH (ref 24–36)

## 2022-03-24 LAB — FERRITIN: Ferritin: 882 ng/mL — ABNORMAL HIGH (ref 24–336)

## 2022-03-24 LAB — PSA: Prostatic Specific Antigen: 0.39 ng/mL (ref 0.00–4.00)

## 2022-03-24 MED ORDER — LACTULOSE 10 GM/15ML PO SOLN: 10.0000 g | Freq: Every day | ORAL | 0 refills | Status: DC | PRN

## 2022-03-24 NOTE — Assessment & Plan Note (Addendum)
CT and MRI images were reviewed and discussed with patient. 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  Recommend ultrasound-guided biopsy for tissue diagnosis. Check LFT, PTT, PT, CEA, AFP, CA 19-9, hepatitis panel, HIV, PSA.  Ammonia level.

## 2022-03-24 NOTE — Assessment & Plan Note (Addendum)
Likely due to underlying malignancy.   Work-up in process.

## 2022-03-24 NOTE — Assessment & Plan Note (Signed)
Recommend patient to try oxycodone.  We will send refills if he tolerates.

## 2022-03-24 NOTE — Telephone Encounter (Signed)
Per LOS on 03/24/22:  US guided liver mass biopsy - STAT 1 week MD visit + palliative care.

## 2022-03-24 NOTE — Progress Notes (Signed)
Briefly met with Joe Adams and his fiance. Introduced Therapist, nutritional and provided contact information for future needs.

## 2022-03-24 NOTE — Assessment & Plan Note (Signed)
Recommend Colace 100 mg twice daily.  Trials of lactulose 10 g 1-2 times daily as needed.

## 2022-03-24 NOTE — Telephone Encounter (Signed)
Patient scheduled for biopsy on  Tues 10/10 at [place] and arrive at [place]. Pt informed of biopsy appt.   Please scheduled MD/ palliative 1 week (flex) after biopsy and inform pt of appt.

## 2022-03-24 NOTE — Progress Notes (Signed)
Hematology/Oncology Consult note [phone] [phone]         Patient Care Team: Ranae Plumber, [place] as PCP - General (Family Medicine) Ranae Plumber, [place] (Family Medicine) Christene Lye, MD (General Surgery) Clent Jacks, RN as Oncology Nurse Navigator  REFERRING PROVIDER: Ranae Plumber, [place]   CHIEF COMPLAINTS/REASON FOR VISIT:  Evaluation of liver mass  HISTORY OF PRESENTING ILLNESS:   Joe Adams is a  61 y.o.  male with PMH listed below was seen in consultation at the request of  Ranae Plumber, [place]  for evaluation of liver mass.  03/22/2022, patient presented to emergency room for evaluation of right side lower chest/upper abdomen/flank pain, intermittent low-grade fever and chills, night sweats and unintentional weight loss of 20 to 30 pounds over the past 6 months.  He finished a course of antibiotics a few months ago for unknown infection and symptom has not improved.  03/22/2022, CT chest angiogram PE protocol/CT abdomen pelvis with contrast No acute pulm embolism.  Multiple heterogeneous enhancing masses throughout the liver, renomegaly, multiple borderline enlarged lymph nodes above and below the diaphragm.  Homogenously enlarged thyroid gland.  Mild bronchial wall thickening with bibasilar atelectasis.  03/22/2022, MRI liver with and without contrast showed bilobar hepatic lesion demonstrated irregular, 2 with minimally increased T2 signal, hypointense intrinsic T1 signal postcontrast enhancement that slowly increases.  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  Other differential consideration includes atypical hepatocellular carcinoma.  Enlarged upper abdominal lymph node, enhancing lesions in the right 10th rib and L2 vertebral body concerning for osseous metastasis.  Prominent subcentimeter pericardial phrenic and internal mammary lymph nodes suspicious for nodal metastasis.  Patient was referred by ER physician  to establish care with oncology.  Patient was accompanied by his girlfriend. Patient was prescribed oxycodone 15 tablets and he has not started using due to the concern of constipation.  He has not had a bowel movement for 3 to 4 days.  He takes Colace as needed.  Pain is 5 out of 10.  No nausea vomiting diarrhea.  Patient reports history of alcohol consumption, usually hard liquor or beer.  He has not drunk alcohol for the past 2 years.  Denies any history of hepatitis.  MEDICAL HISTORY:  Past Medical History:  Diagnosis Date   Acute MI ([place]) 2005   Diabetes mellitus without complication (HCC)    GERD (gastroesophageal reflux disease)    Headache    Hernia, umbilical    History of kidney stones    Thyroid disease     SURGICAL HISTORY: Past Surgical History:  Procedure Laterality Date   COLONOSCOPY N/A 11/18/2016   Procedure: COLONOSCOPY IN O.R;  Surgeon: Christene Lye, MD;  Location: [facility] ORS;  Service: General;  Laterality: N/A;   COLONOSCOPY WITH PROPOFOL N/A 11/18/2016   Procedure: COLONOSCOPY WITH PROPOFOL;  Surgeon: Christene Lye, MD;  Location: [facility];  Service: Endoscopy;  Laterality: N/A;   HEMORRHOID SURGERY N/A 11/18/2016   Procedure: HEMORRHOIDECTOMY;  Surgeon: Christene Lye, MD;  Location: [facility] ORS;  Service: General;  Laterality: N/A;   TONSILLECTOMY     as a child    SOCIAL HISTORY: Social History   Socioeconomic History   Marital status: Legally Separated    Spouse name: Not on file   Number of children: Not on file   Years of education: Not on file   Highest education level: Not on file  Occupational History   Not on file  Tobacco Use  Smoking status: Never   Smokeless tobacco: Never  Vaping Use   Vaping Use: Never used  Substance and Sexual Activity   Alcohol use: Not Currently    Alcohol/week: 1.0 standard drink of alcohol    Types: 1 Shots of liquor per week    Comment: occ   Drug use: No   Sexual activity: Not  on file  Other Topics Concern   Not on file  Social History Narrative   Not on file   Social Determinants of Health   Financial Resource Strain: Not on file  Food Insecurity: Not on file  Transportation Needs: Not on file  Physical Activity: Not on file  Stress: Not on file  Social Connections: Not on file  Intimate Partner Violence: Not on file    FAMILY HISTORY: Family History  Problem Relation Age of Onset   Cancer Other    Cancer Paternal Uncle     ALLERGIES:  has No Known Allergies.  MEDICATIONS:  Current Outpatient Medications  Medication Sig Dispense Refill   Docusate Calcium (STOOL SOFTENER PO) Take by mouth.     lactulose (CHRONULAC) 10 GM/15ML solution Take 15 mLs (10 g total) by mouth daily as needed for mild constipation. 236 mL 0   levothyroxine (SYNTHROID) 150 MCG tablet Take 150 mcg by mouth daily.     metFORMIN (GLUCOPHAGE) 1000 MG tablet 2 (two) times daily.     ibuprofen (ADVIL,MOTRIN) 200 MG tablet Take 400 mg by mouth every 8 (eight) hours as needed (for pain).  (Patient not taking: Reported on 03/24/2022)     oxyCODONE-acetaminophen (PERCOCET) 5-325 MG tablet Take 1 tablet by mouth every 6 (six) hours as needed for severe pain. (Patient not taking: Reported on 03/24/2022) 15 tablet 0   Skin Protectants, Misc. (DRY SKIN EX) Apply 1 application topically 3 (three) times daily as needed (for rash/irritated skin between legs/thighs). (Patient not taking: Reported on 03/24/2022)     No current facility-administered medications for this visit.    Review of Systems  Constitutional:  Positive for appetite change, fatigue and unexpected weight change. Negative for chills and fever.  HENT:   Negative for hearing loss and voice change.   Eyes:  Negative for eye problems and icterus.  Respiratory:  Negative for chest tightness, cough and shortness of breath.   Cardiovascular:  Negative for chest pain and leg swelling.  Gastrointestinal:  Positive for abdominal  pain. Negative for abdominal distention.  Endocrine: Negative for hot flashes.  Genitourinary:  Negative for difficulty urinating, dysuria and frequency.   Musculoskeletal:  Negative for arthralgias.  Skin:  Negative for itching and rash.  Neurological:  Negative for light-headedness and numbness.  Hematological:  Negative for adenopathy. Does not bruise/bleed easily.  Psychiatric/Behavioral:  Negative for confusion.    unable PHYSICAL EXAMINATION: ECOG PERFORMANCE STATUS: 1 - Symptomatic but completely ambulatory Vitals:   03/24/22 0925  BP: 115/81  Pulse: 87  Resp: 18  SpO2: 98%   There were no vitals filed for this visit.  Physical Exam Constitutional:      General: He is not in acute distress. HENT:     Head: Normocephalic and atraumatic.  Eyes:     General: No scleral icterus. Cardiovascular:     Rate and Rhythm: Normal rate and regular rhythm.     Heart sounds: Normal heart sounds.  Pulmonary:     Effort: Pulmonary effort is normal. No respiratory distress.     Breath sounds: No wheezing.  Abdominal:  General: Bowel sounds are normal. There is no distension.     Palpations: Abdomen is soft.     Tenderness: There is abdominal tenderness.  Musculoskeletal:        General: No deformity. Normal range of motion.     Cervical back: Normal range of motion and neck supple.  Skin:    General: Skin is warm and dry.     Findings: No erythema or rash.  Neurological:     Mental Status: He is alert and oriented to person, place, and time. Mental status is at baseline.     Cranial Nerves: No cranial nerve deficit.     Coordination: Coordination normal.  Psychiatric:        Mood and Affect: Mood normal.     LABORATORY DATA:  I have reviewed the data as listed    Latest Ref Rng & Units 03/22/2022   11:52 AM 10/31/2014    3:24 PM 10/24/2014   11:05 AM  CBC  WBC 4.0 - 10.5 K/uL 12.9  7.2  6.3   Hemoglobin 13.0 - 17.0 g/dL 12.2  16.1  16.9   Hematocrit 39.0 - 52.0 %  38.1  46.1  48.6   Platelets 150 - 400 K/uL 361  172  166       Latest Ref Rng & Units 03/24/2022   10:19 AM 03/22/2022   11:52 AM 10/31/2014    3:24 PM  CMP  Glucose 70 - 99 mg/dL  180  127   BUN 8 - 23 mg/dL  37  24   Creatinine 0.61 - 1.24 mg/dL  1.63  0.92   Sodium 135 - 145 mmol/L  138  139   Potassium 3.5 - 5.1 mmol/L  3.9  3.6   Chloride 98 - 111 mmol/L  100  106   CO2 22 - 32 mmol/L  23  26   Calcium 8.9 - 10.3 mg/dL  9.3  8.9   Total Protein 6.5 - 8.1 g/dL 7.9  8.0  7.3   Total Bilirubin 0.3 - 1.2 mg/dL 0.8  1.7  0.8   Alkaline Phos 38 - 126 U/L 218  196  72   AST 15 - 41 U/L 24  36  34   ALT 0 - 44 U/L 18  22  41       RADIOGRAPHIC STUDIES: I have personally reviewed the radiological images as listed and agreed with the findings in the report. MR LIVER W WO CONTRAST  Result Date: 03/22/2022 CLINICAL DATA:  Further evaluation of hepatic lesions seen on prior CT. EXAM: MRI ABDOMEN WITHOUT AND WITH CONTRAST TECHNIQUE: Multiplanar multisequence MR imaging of the abdomen was performed both before and after the administration of intravenous contrast. CONTRAST:  7cc of Vueway COMPARISON:  Multiple priors including most recent CT chest abdomen pelvis dated March 22, 2022. FINDINGS: Lower chest: No acute abnormality. Hepatobiliary: Bilobar hepatic lesions demonstrate irregular contour with minimally increased T2 signal and hypointense intrinsic T1 signal demonstrating postcontrast enhancement which slowly increases over progressive postcontrast pulse sequences most dominant on the 5 minute delayed and with a peripheral rim of reduced diffusivity. For reference: -lesion in the central right lobe of the liver measures 9.2 x 7.2 cm on image 49/13 -segment IVa/VIII hepatic lesion measures 7.9 x 5.4 cm on image 31/13 -segment II hepatic lesion measures 3.4 x 2.7 cm on image 21/13. No evidence of portal venous involvement. Gallbladder is unremarkable.  No biliary ductal dilation. Pancreas: No  pancreatic ductal dilation  or evidence of acute inflammation. No suspicious pancreatic mass identified. Spleen:  No splenomegaly or focal splenic lesion. Adrenals/Urinary Tract: Bilateral adrenal glands are within normal limits. No hydronephrosis. No solid enhancing renal mass. Stomach/Bowel: Visualized portions within the abdomen are unremarkable. Vascular/Lymphatic: Normal caliber abdominal aorta. The portal, splenic and superior mesenteric veins are patent. Enlarged retroperitoneal, portacaval, periportal, and gastrohepatic ligament lymph nodes. For reference: -portacaval lymph node measures 13 mm in short axis on image 49/16. -periportal lymph node measures 10 mm in short axis on image 44/16. Prominent subcentimeter pericardiophrenic and internal mammary lymph nodes. For reference: -A lymph node along the inferior aspect of the internal mammary artery on the right below the xiphoid process measures 8 mm in short axis on image 18/22 -A pericardiophrenic lymph node measures 7 mm in short axis on image 88/22. Other:  No significant abdominopelvic free fluid. Musculoskeletal: Enhancing lesion in the right tenth rib on image 38/22. Enhancing lesion in the L2 vertebral body on image 56/22. IMPRESSION: 1. Bilobar hepatic lesions demonstrate irregular contour with minimally increased T2 signal, hypointense intrinsic T1 signal, postcontrast enhancement that is slowly increases and is most prominent in intensities 5 minutes postcontrast administration as well as a peripheral rim of reduced diffusivity. 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 With alternate differential consideration of atypical hepatocellular carcinoma. Recommend oncology referral with multidisciplinary tumor board consultation and definitive diagnosis with direct tissue sampling. 2. Enlarged upper abdominal lymph nodes, likely reflect metastatic nodal disease involvement. 3. Enhancing lesions in the right tenth  rib and L2 vertebral body, concerning for osseous metastatic disease. 4. Prominent subcentimeter pericardiophrenic and internal mammary lymph nodes also suspicious for nodal metastatic disease. Electronically Signed   By: Dahlia Bailiff M.D.   On: 03/22/2022 19:52   CT Angio Chest PE W/Cm &/Or Wo Cm  Result Date: 03/22/2022 CLINICAL DATA:  Abdominal pain, acute, nonlocalized; Pulmonary embolism (PE) suspected, high prob EXAM: CT ANGIOGRAPHY CHEST CT ABDOMEN AND PELVIS WITH CONTRAST TECHNIQUE: Multidetector CT imaging of the chest was performed using the standard protocol during bolus administration of intravenous contrast. Multiplanar CT image reconstructions and MIPs were obtained to evaluate the vascular anatomy. Multidetector CT imaging of the abdomen and pelvis was performed using the standard protocol during bolus administration of intravenous contrast. RADIATION DOSE REDUCTION: This exam was performed according to the departmental dose-optimization program which includes automated exposure control, adjustment of the mA and/or kV according to patient size and/or use of iterative reconstruction technique. CONTRAST:  37m OMNIPAQUE IOHEXOL 350 MG/ML SOLN COMPARISON:  Oct 31, 2014 FINDINGS: CTA CHEST FINDINGS Cardiovascular: Satisfactory opacification of the pulmonary arteries to the segmental level. No evidence of pulmonary embolism. Mildly enlarged heart size. No pericardial effusion. Mediastinum/Nodes: Homogeneously enlarged thyroid gland. There are multiple borderline enlarged mediastinal lymph nodes. RIGHT paratracheal lymph node measures 11 mm in the short axis (series 507, image 55). No axillary adenopathy or hilar adenopathy. Lungs/Pleura: No pleural effusion or pneumothorax. Mild bronchial wall thickening with scattered areas of endobronchial debris. Bibasilar atelectasis. Musculoskeletal: No aggressive osseous abnormality. Review of the MIP images confirms the above findings. CT ABDOMEN and PELVIS  FINDINGS Hepatobiliary: There are multiple heterogeneously enhancing masses throughout the liver. Representative mass within hepatic segment 5/6 spans approximately 8.6 cm (series 11, image 33). Representative heterogeneously enhancing mass spanning hepatic segments 6/7 with associated capsular retraction spans at least 11.8 cm (series 14, image 70). Gallbladder is unremarkable. Portal vein is patent. Pancreas: Unremarkable. No pancreatic ductal dilatation or surrounding inflammatory changes.  Spleen: Splenomegaly. Adrenals/Urinary Tract: Adrenal glands are unremarkable. Kidneys are normal, without renal calculi, focal lesion, or hydronephrosis. Bladder is unremarkable. Stomach/Bowel: Stomach is within normal limits. Appendix appears normal. No evidence of bowel wall thickening, distention, or inflammatory changes. Vascular/Lymphatic: Abdominal aorta is normal in course and caliber. Mildly prominent porta hepatic lymph node measures 15 mm in the short axis (series 11, image 28). Prominent LEFT periaortic lymph node measures 9 mm in the short axis (series 11, image 38). Reproductive: Coarse calcifications of the prostate. Other: No free air or free fluid. Musculoskeletal: No aggressive osseous lesions. Limbus vertebra of L5. Review of the MIP images confirms the above findings. IMPRESSION: 1. No acute pulmonary embolism. 2. There are multiple heterogeneously enhancing masses throughout the liver, several which demonstrate capsular retraction. No definitive extrahepatic primary is identified. Findings could reflect multifocal hepatocellular carcinoma/cholangiocarcinoma, lymphoma or abscess. Recommend correlation with blood chemistry values. Liver MRI with and without contrast could be of assistance in further characterizing these masses. 3. Splenomegaly. 4. Multiple borderline enlarged lymph nodes above and below the diaphragm. 5. Homogeneously enlarged thyroid gland, nonspecific. Consider further dedicated evaluation  with nonemergent thyroid ultrasound. 6. Mild bronchial wall thickening with bibasilar atelectasis. This could reflect a nonspecific bronchitis. These results were called by telephone at the time of interpretation on 03/22/2022 at 4:39 pm to provider Dr. Archie Balboa, who verbally acknowledged these results. Electronically Signed   By: Valentino Saxon M.D.   On: 03/22/2022 16:47   CT Abdomen Pelvis W Contrast  Result Date: 03/22/2022 CLINICAL DATA:  Abdominal pain, acute, nonlocalized; Pulmonary embolism (PE) suspected, high prob EXAM: CT ANGIOGRAPHY CHEST CT ABDOMEN AND PELVIS WITH CONTRAST TECHNIQUE: Multidetector CT imaging of the chest was performed using the standard protocol during bolus administration of intravenous contrast. Multiplanar CT image reconstructions and MIPs were obtained to evaluate the vascular anatomy. Multidetector CT imaging of the abdomen and pelvis was performed using the standard protocol during bolus administration of intravenous contrast.body onc RADIATION DOSE REDUCTION: This exam was performed according to the departmental dose-optimization program which includes automated exposure control, adjustment of the mA and/or kV according to patient size and/or use of iterative reconstruction technique. CONTRAST:  29m OMNIPAQUE IOHEXOL 350 MG/ML SOLN COMPARISON:  Oct 31, 2014 FINDINGS: CTA CHEST FINDINGS Cardiovascular: Satisfactory opacification of the pulmonary arteries to the segmental level. No evidence of pulmonary embolism. Mildly enlarged heart size. No pericardial effusion. Mediastinum/Nodes: Homogeneously enlarged thyroid gland. There are multiple borderline enlarged mediastinal lymph nodes. RIGHT paratracheal lymph node measures 11 mm in the short axis (series 507, image 55). No axillary adenopathy or hilar adenopathy. Lungs/Pleura: No pleural effusion or pneumothorax. Mild bronchial wall thickening with scattered areas of endobronchial debris. Bibasilar atelectasis.  Musculoskeletal: No aggressive osseous abnormality. Review of the MIP images confirms the above findings. CT ABDOMEN and PELVIS FINDINGS Hepatobiliary: There are multiple heterogeneously enhancing masses throughout the liver. Representative mass within hepatic segment 5/6 spans approximately 8.6 cm (series 11, image 33). Representative heterogeneously enhancing mass spanning hepatic segments 6/7 with associated capsular retraction spans at least 11.8 cm (series 14, image 70). Gallbladder is unremarkable. Portal vein is patent. Pancreas: Unremarkable. No pancreatic ductal dilatation or surrounding inflammatory changes. Spleen: Splenomegaly. Adrenals/Urinary Tract: Adrenal glands are unremarkable. Kidneys are normal, without renal calculi, focal lesion, or hydronephrosis. Bladder is unremarkable. Stomach/Bowel: Stomach is within normal limits. Appendix appears normal. No evidence of bowel wall thickening, distention, or inflammatory changes. Vascular/Lymphatic: Abdominal aorta is normal in course and caliber. Mildly prominent porta hepatic lymph  node measures 15 mm in the short axis (series 11, image 28). Prominent LEFT periaortic lymph node measures 9 mm in the short axis (series 11, image 38). Reproductive: Coarse calcifications of the prostate. Other: No free air or free fluid. Musculoskeletal: No aggressive osseous lesions. Limbus vertebra of L5. Review of the MIP images confirms the above findings. IMPRESSION: 1. No acute pulmonary embolism. 2. There are multiple heterogeneously enhancing masses throughout the liver, several which demonstrate capsular retraction. No definitive extrahepatic primary is identified. Findings could reflect multifocal hepatocellular carcinoma/cholangiocarcinoma, lymphoma or abscess. Recommend correlation with blood chemistry values. Liver MRI with and without contrast could be of assistance in further characterizing these masses. 3. Splenomegaly. 4. Multiple borderline enlarged lymph  nodes above and below the diaphragm. 5. Homogeneously enlarged thyroid gland, nonspecific. Consider further dedicated evaluation with nonemergent thyroid ultrasound. 6. Mild bronchial wall thickening with bibasilar atelectasis. This could reflect a nonspecific bronchitis. These results were called by telephone at the time of interpretation on 03/22/2022 at 4:39 pm to provider Dr. Archie Balboa, who verbally acknowledged these results. Electronically Signed   By: Valentino Saxon M.D.   On: 03/22/2022 16:47   CT Head Wo Contrast  Result Date: 03/22/2022 CLINICAL DATA:  Dizziness, persistent/recurrent, cardiac or vascular cause suspected EXAM: CT HEAD WITHOUT CONTRAST TECHNIQUE: Contiguous axial images were obtained from the base of the skull through the vertex without intravenous contrast. RADIATION DOSE REDUCTION: This exam was performed according to the departmental dose-optimization program which includes automated exposure control, adjustment of the mA and/or kV according to patient size and/or use of iterative reconstruction technique. COMPARISON:  Nov 17, 2004 FINDINGS: Brain: No evidence of acute infarction, hemorrhage, hydrocephalus, extra-axial collection or mass lesion/mass effect. Periventricular white matter hypodensities consistent with sequela of chronic microvascular ischemic disease. Vascular: No hyperdense vessel or unexpected calcification. Skull: No acute fracture. 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 Sinuses/Orbits: No acute finding. Other: None. IMPRESSION: No acute intracranial abnormality. Electronically Signed   By: Valentino Saxon M.D.   On: 03/22/2022 16:14   DG Chest 1 View  Result Date: 03/22/2022 CLINICAL DATA:  Dizziness and right-sided pain at night. Intermittent fevers and unexpected weight loss. EXAM: CHEST  1 VIEW COMPARISON:  Chest radiograph 12/29/2010, 12/13/2007 FINDINGS: Borderline enlargement of the cardiac silhouette, which could  exaggerated secondary to portable technique. Subtle interstitial thickening in the lower lungs. No pleural effusion or pneumothorax. Visualized osseous structures are unremarkable. IMPRESSION: Subtle interstitial thickening in the lower lungs may represent subsegmental atelectasis or possibly mild pulmonary edema. No lobar consolidation or pleural effusion. Electronically Signed   By: Ileana Roup M.D.   On: 03/22/2022 13:51       ASSESSMENT & PLAN:   Liver mass CT and MRI images were reviewed and discussed with patient. 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  Recommend ultrasound-guided biopsy for tissue diagnosis. Check LFT, PTT, PT, CEA, AFP, CA 19-9, hepatitis panel, HIV, PSA.  Ammonia level.  Unintentional weight loss Likely due to underlying malignancy.   Work-up in process.  Abdominal pain Recommend patient to try oxycodone.  We will send refills if he tolerates.  Constipation Recommend Colace 100 mg twice daily.  Trials of lactulose 10 g 1-2 times daily as needed.   Orders Placed This Encounter  Procedures   US BIOPSY (LIVER)    Standing Status:   Future    Standing Expiration Date:   03/25/2023    Order Specific Question:   Lab orders  requested (DO NOT place separate lab orders, these will be automatically ordered during procedure specimen collection):    Answer:   Surgical Pathology    Order Specific Question:   Reason for Exam (SYMPTOM  OR DIAGNOSIS REQUIRED)    Answer:   liver mass    Order Specific Question:   Preferred location?    Answer:   [facility]   Hepatitis panel, acute    Standing Status:   Future    Number of Occurrences:   1    Standing Expiration Date:   03/25/2023   HIV Antibody (routine testing w rflx)    Standing Status:   Future    Number of Occurrences:   1    Standing Expiration Date:   03/25/2023   AFP tumor marker    Standing Status:   Future    Number of Occurrences:   1    Standing Expiration  Date:   03/24/2023   Cancer antigen 19-9    Standing Status:   Future    Number of Occurrences:   1    Standing Expiration Date:   03/24/2023   Hepatic function panel    Standing Status:   Future    Number of Occurrences:   1    Standing Expiration Date:   03/24/2023   APTT    Standing Status:   Future    Number of Occurrences:   1    Standing Expiration Date:   03/25/2023   Protime-INR    Standing Status:   Future    Number of Occurrences:   1    Standing Expiration Date:   03/25/2023   CEA    Standing Status:   Future    Number of Occurrences:   1    Standing Expiration Date:   03/24/2023   Lactate dehydrogenase    Standing Status:   Future    Number of Occurrences:   1    Standing Expiration Date:   03/25/2023   Ferritin    Standing Status:   Future    Number of Occurrences:   1    Standing Expiration Date:   09/23/2022   Iron and TIBC    Standing Status:   Future    Number of Occurrences:   1    Standing Expiration Date:   03/25/2023   PSA    Standing Status:   Future    Number of Occurrences:   1    Standing Expiration Date:   03/25/2023   Ammonia    Standing Status:   Future    Number of Occurrences:   1    Standing Expiration Date:   03/25/2023   Ambulatory Referral to Palliative Care    Referral Priority:   Routine    Referral Type:   Consultation    Referral Reason:   Goals of Care    Number of Visits Requested:   1   Follow-up 1 week after biopsy. All questions were answered. The patient knows to call the clinic with any problems, questions or concerns.  Ranae Plumber, [place]   Thank you for this kind referral and the opportunity to participate in the care of this patient. A copy of today's note is routed to referring provider   [name], MD, PhD [facility] Hematology Oncology 03/24/2022

## 2022-03-25 NOTE — OR Nursing (Signed)
Called patient. Reviewed pre liver biopsy instructions including NPo after midnight. Hold metformin and have driver for discharge.

## 2022-03-26 ENCOUNTER — Telehealth: Payer: Self-pay

## 2022-03-26 LAB — AFP TUMOR MARKER: AFP, Serum, Tumor Marker: <1.8 ng/mL (ref 0.0–8.4)

## 2022-03-26 LAB — CANCER ANTIGEN 19-9: CA 19-9: 6 U/mL (ref 0–35)

## 2022-03-26 LAB — CEA: CEA: 1.1 ng/mL (ref 0.0–4.7)

## 2022-03-26 NOTE — Telephone Encounter (Signed)
Returned call to Mr. Michaelson regarding liver biopsy. Reiterated all instructions. No further  questions at this time.

## 2022-03-30 ENCOUNTER — Other Ambulatory Visit (HOSPITAL_COMMUNITY): Payer: Self-pay | Admitting: Radiology

## 2022-03-30 DIAGNOSIS — R160 Hepatomegaly, not elsewhere classified: Secondary | ICD-10-CM

## 2022-03-31 ENCOUNTER — Ambulatory Visit
Admission: RE | Admit: 2022-03-31 | Discharge: 2022-03-31 | Disposition: A | Discharge status: Home / Self Care | Payer: 59 | Source: Ambulatory Visit | Attending: Oncology | Admitting: Oncology

## 2022-03-31 ENCOUNTER — Other Ambulatory Visit: Payer: Self-pay

## 2022-03-31 DIAGNOSIS — C8338 Diffuse large B-cell lymphoma, lymph nodes of multiple sites: Secondary | ICD-10-CM | POA: Insufficient documentation

## 2022-03-31 DIAGNOSIS — I252 Old myocardial infarction: Secondary | ICD-10-CM | POA: Diagnosis not present

## 2022-03-31 DIAGNOSIS — K219 Gastro-esophageal reflux disease without esophagitis: Secondary | ICD-10-CM | POA: Insufficient documentation

## 2022-03-31 DIAGNOSIS — R160 Hepatomegaly, not elsewhere classified: Secondary | ICD-10-CM | POA: Diagnosis present

## 2022-03-31 DIAGNOSIS — R634 Abnormal weight loss: Secondary | ICD-10-CM | POA: Diagnosis not present

## 2022-03-31 DIAGNOSIS — E119 Type 2 diabetes mellitus without complications: Secondary | ICD-10-CM | POA: Insufficient documentation

## 2022-03-31 LAB — PROTIME-INR
INR: 1.2 (ref 0.8–1.2)
Prothrombin Time: 15 seconds (ref 11.4–15.2)

## 2022-03-31 LAB — CBC
HCT: 38.3 % — ABNORMAL LOW (ref 39.0–52.0)
Hemoglobin: 12.7 g/dL — ABNORMAL LOW (ref 13.0–17.0)
MCH: 27.5 pg (ref 26.0–34.0)
MCHC: 33.2 g/dL (ref 30.0–36.0)
MCV: 82.9 fL (ref 80.0–100.0)
Platelets: 353 10*3/uL (ref 150–400)
RBC: 4.62 MIL/uL (ref 4.22–5.81)
RDW: 14.4 % (ref 11.5–15.5)
WBC: 10.4 10*3/uL (ref 4.0–10.5)
nRBC: 0 % (ref 0.0–0.2)

## 2022-03-31 LAB — GLUCOSE, CAPILLARY
Glucose-Capillary: 100 mg/dL — ABNORMAL HIGH (ref 70–99)
Glucose-Capillary: 105 mg/dL — ABNORMAL HIGH (ref 70–99)

## 2022-03-31 MED ORDER — MIDAZOLAM HCL 2 MG/2ML IJ SOLN
INTRAMUSCULAR | Status: AC | PRN
  Administered 2022-03-31: 1 mg via INTRAVENOUS

## 2022-03-31 MED ORDER — MIDAZOLAM HCL 2 MG/2ML IJ SOLN
INTRAMUSCULAR | Status: AC
  Filled 2022-03-31: qty 4

## 2022-03-31 MED ORDER — FENTANYL CITRATE (PF) 100 MCG/2ML IJ SOLN
INTRAMUSCULAR | Status: AC | PRN
  Administered 2022-03-31: 50 ug via INTRAVENOUS

## 2022-03-31 MED ORDER — SODIUM CHLORIDE 0.9 % IV SOLN: INTRAVENOUS | Status: DC

## 2022-03-31 MED ORDER — FENTANYL CITRATE (PF) 100 MCG/2ML IJ SOLN
INTRAMUSCULAR | Status: AC
  Filled 2022-03-31: qty 2

## 2022-03-31 NOTE — Procedures (Signed)
Interventional Radiology Procedure Note  Procedure: Ultrasound guided liver mass biopsy  Findings: Please refer to procedural dictation for full description. 18 ga core x 4 from right lobe liver mass.  Gelfoam slurry needle track embolization.  Complications: None immediate  Estimated Blood Loss: <5 ml  Recommendation: Strict 3 hour bedrest. Follow up Pathology results.   Ruthann Cancer, MD

## 2022-03-31 NOTE — H&P (Signed)
Chief Complaint: Patient was seen in consultation today for ultrasound-guided liver biopsy.  Referring Physician(s): Yu,Zhou  Supervising Physician: Ruthann Cancer  Patient Status: [facility] - Out-pt  History of Present Illness: Joe Adams is a 61 y.o. male with PMH of acute MI in 2005, T2DM, and GERD being seen today in relation to a bilobar hepatic lesions discovered 03/22/22 after presenting to the ED for fever, chills, night sweats, abdominal pain, and unintentional weight loss of 20-30 pounds. CT Abdomen and MR Liver revealed "-lesion in the central right lobe of the liver measures 9.2 x 7.2 cm on image 49/13  -segment IVa/VIII hepatic lesion measures 7.9 x 5.4 cm on image 31/13 -segment II hepatic lesion measures 3.4 x 2.7 cm on image 21/13." Patient was referred to IR for ultrasound-guided biopsy of liver lesion.  Past Medical History:  Diagnosis Date   Acute MI ([place]) 2005   Diabetes mellitus without complication (HCC)    GERD (gastroesophageal reflux disease)    Headache    Hernia, umbilical    History of kidney stones    Thyroid disease     Past Surgical History:  Procedure Laterality Date   COLONOSCOPY N/A 11/18/2016   Procedure: COLONOSCOPY IN O.R;  Surgeon: Christene Lye, MD;  Location: [facility] ORS;  Service: General;  Laterality: N/A;   COLONOSCOPY WITH PROPOFOL N/A 11/18/2016   Procedure: COLONOSCOPY WITH PROPOFOL;  Surgeon: Christene Lye, MD;  Location: [facility] ENDOSCOPY;  Service: Endoscopy;  Laterality: N/A;   HEMORRHOID SURGERY N/A 11/18/2016   Procedure: HEMORRHOIDECTOMY;  Surgeon: Christene Lye, MD;  Location: [facility] ORS;  Service: General;  Laterality: N/A;   TONSILLECTOMY     as a child    Allergies: Patient has no known allergies.  Medications: Prior to Admission medications   Medication Sig Start Date End Date Taking? Authorizing Provider  Docusate Calcium (STOOL SOFTENER PO) Take by mouth.    [provider]   ibuprofen (ADVIL,MOTRIN) 200 MG tablet Take 400 mg by mouth every 8 (eight) hours as needed (for pain).  Patient not taking: Reported on 03/24/2022    [provider]  lactulose (CHRONULAC) 10 GM/15ML solution Take 15 mLs (10 g total) by mouth daily as needed for mild constipation. 03/24/22   [name], MD  levothyroxine (SYNTHROID) 150 MCG tablet Take 150 mcg by mouth daily. 03/09/22   [provider]  metFORMIN (GLUCOPHAGE) 1000 MG tablet 2 (two) times daily. 08/06/21   [provider]  oxyCODONE-acetaminophen (PERCOCET) 5-325 MG tablet Take 1 tablet by mouth every 6 (six) hours as needed for severe pain. Patient not taking: Reported on 03/24/2022 03/22/22 03/22/23  Nance Pear, MD  Skin Protectants, Misc. (DRY SKIN EX) Apply 1 application topically 3 (three) times daily as needed (for rash/irritated skin between legs/thighs). Patient not taking: Reported on 03/24/2022    [provider]     Family History  Problem Relation Age of Onset   Cancer Other    Cancer Paternal Uncle     Social History   Socioeconomic History   Marital status: Legally Separated    Spouse name: Not on file   Number of children: Not on file   Years of education: Not on file   Highest education level: Not on file  Occupational History   Not on file  Tobacco Use   Smoking status: Never   Smokeless tobacco: Never  Vaping Use   Vaping Use: Never used  Substance and Sexual Activity   Alcohol  use: Not Currently    Alcohol/week: 1.0 standard drink of alcohol    Types: 1 Shots of liquor per week    Comment: occ   Drug use: No   Sexual activity: Not on file  Other Topics Concern   Not on file  Social History Narrative   Not on file   Social Determinants of Health   Financial Resource Strain: Not on file  Food Insecurity: Not on file  Transportation Needs: Not on file  Physical Activity: Not on file  Stress: Not on file  Social Connections: Not on file    Review  of Systems: A 12 point ROS discussed and pertinent positives are indicated in the HPI above.  All other systems are negative.  Review of Systems  Constitutional:  Negative for chills and fever.  Respiratory:  Negative for chest tightness and shortness of breath.   Cardiovascular:  Negative for chest pain and leg swelling.  Gastrointestinal:  Positive for constipation. Negative for abdominal pain, diarrhea, nausea and vomiting.  Neurological:  Negative for light-headedness and headaches.  Psychiatric/Behavioral:  Negative for confusion.     Vital Signs: There were no vitals taken for this visit.    Physical Exam Vitals reviewed.  Constitutional:      Appearance: Normal appearance.  HENT:     Mouth/Throat:     Mouth: Mucous membranes are moist.  Cardiovascular:     Rate and Rhythm: Normal rate and regular rhythm.     Pulses: Normal pulses.     Heart sounds: Normal heart sounds.  Pulmonary:     Effort: Pulmonary effort is normal.     Breath sounds: Normal breath sounds.  Abdominal:     General: Abdomen is flat. Bowel sounds are normal.     Palpations: Abdomen is soft.     Tenderness: There is no abdominal tenderness.  Musculoskeletal:     Right lower leg: No edema.     Left lower leg: No edema.  Skin:    General: Skin is warm and dry.  Neurological:     Mental Status: He is alert and oriented to person, place, and time.  Psychiatric:        Mood and Affect: Mood normal.        Behavior: Behavior normal.        Thought Content: Thought content normal.        Judgment: Judgment normal.     Imaging: MR LIVER W WO CONTRAST  Result Date: 03/22/2022 CLINICAL DATA:  Further evaluation of hepatic lesions seen on prior CT. EXAM: MRI ABDOMEN WITHOUT AND WITH CONTRAST TECHNIQUE: Multiplanar multisequence MR imaging of the abdomen was performed both before and after the administration of intravenous contrast. CONTRAST:  7cc of Vueway COMPARISON:  Multiple priors including most  recent CT chest abdomen pelvis dated March 22, 2022. FINDINGS: Lower chest: No acute abnormality. Hepatobiliary: Bilobar hepatic lesions demonstrate irregular contour with minimally increased T2 signal and hypointense intrinsic T1 signal demonstrating postcontrast enhancement which slowly increases over progressive postcontrast pulse sequences most dominant on the 5 minute delayed and with a peripheral rim of reduced diffusivity. For reference: -lesion in the central right lobe of the liver measures 9.2 x 7.2 cm on image 49/13 -segment IVa/VIII hepatic lesion measures 7.9 x 5.4 cm on image 31/13 -segment II hepatic lesion measures 3.4 x 2.7 cm on image 21/13. No evidence of portal venous involvement. Gallbladder is unremarkable.  No biliary ductal dilation. Pancreas: No pancreatic ductal dilation or evidence of  acute inflammation. No suspicious pancreatic mass identified. Spleen:  No splenomegaly or focal splenic lesion. Adrenals/Urinary Tract: Bilateral adrenal glands are within normal limits. No hydronephrosis. No solid enhancing renal mass. Stomach/Bowel: Visualized portions within the abdomen are unremarkable. Vascular/Lymphatic: Normal caliber abdominal aorta. The portal, splenic and superior mesenteric veins are patent. Enlarged retroperitoneal, portacaval, periportal, and gastrohepatic ligament lymph nodes. For reference: -portacaval lymph node measures 13 mm in short axis on image 49/16. -periportal lymph node measures 10 mm in short axis on image 44/16. Prominent subcentimeter pericardiophrenic and internal mammary lymph nodes. For reference: -A lymph node along the inferior aspect of the internal mammary artery on the right below the xiphoid process measures 8 mm in short axis on image 18/22 -A pericardiophrenic lymph node measures 7 mm in short axis on image 88/22. Other:  No significant abdominopelvic free fluid. Musculoskeletal: Enhancing lesion in the right tenth rib on image 38/22. Enhancing lesion  in the L2 vertebral body on image 56/22. IMPRESSION: 1. Bilobar hepatic lesions demonstrate irregular contour with minimally increased T2 signal, hypointense intrinsic T1 signal, postcontrast enhancement that is slowly increases and is most prominent in intensities 5 minutes postcontrast administration as well as a peripheral rim of reduced diffusivity. 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 With alternate differential consideration of atypical hepatocellular carcinoma. Recommend oncology referral with multidisciplinary tumor board consultation and definitive diagnosis with direct tissue sampling. 2. Enlarged upper abdominal lymph nodes, likely reflect metastatic nodal disease involvement. 3. Enhancing lesions in the right tenth rib and L2 vertebral body, concerning for osseous metastatic disease. 4. Prominent subcentimeter pericardiophrenic and internal mammary lymph nodes also suspicious for nodal metastatic disease. Electronically Signed   By: Dahlia Bailiff M.D.   On: 03/22/2022 19:52   CT Angio Chest PE W/Cm &/Or Wo Cm  Result Date: 03/22/2022 CLINICAL DATA:  Abdominal pain, acute, nonlocalized; Pulmonary embolism (PE) suspected, high prob EXAM: CT ANGIOGRAPHY CHEST CT ABDOMEN AND PELVIS WITH CONTRAST TECHNIQUE: Multidetector CT imaging of the chest was performed using the standard protocol during bolus administration of intravenous contrast. Multiplanar CT image reconstructions and MIPs were obtained to evaluate the vascular anatomy. Multidetector CT imaging of the abdomen and pelvis was performed using the standard protocol during bolus administration of intravenous contrast. body onc RADIATION DOSE REDUCTION: This exam was performed according to the departmental dose-optimization program which includes automated exposure control, adjustment of the mA and/or kV according to patient size and/or use of iterative reconstruction technique. CONTRAST:  22m OMNIPAQUE  IOHEXOL 350 MG/ML SOLN COMPARISON:  Oct 31, 2014 FINDINGS: CTA CHEST FINDINGS Cardiovascular: Satisfactory opacification of the pulmonary arteries to the segmental level. No evidence of pulmonary embolism. Mildly enlarged heart size. No pericardial effusion. Mediastinum/Nodes: Homogeneously enlarged thyroid gland. There are multiple borderline enlarged mediastinal lymph nodes. RIGHT paratracheal lymph node measures 11 mm in the short axis (series 507, image 55). No axillary adenopathy or hilar adenopathy. Lungs/Pleura: No pleural effusion or pneumothorax. Mild bronchial wall thickening with scattered areas of endobronchial debris. Bibasilar atelectasis. Musculoskeletal: No aggressive osseous abnormality. Review of the MIP images confirms the above findings. CT ABDOMEN and PELVIS FINDINGS Hepatobiliary: There are multiple heterogeneously enhancing masses throughout the liver. Representative mass within hepatic segment 5/6 spans approximately 8.6 cm (series 11, image 33). Representative heterogeneously enhancing mass spanning hepatic segments 6/7 with associated capsular retraction spans at least 11.8 cm (series 14, image 70). Gallbladder is unremarkable. Portal vein is patent. Pancreas: Unremarkable. No pancreatic ductal dilatation or surrounding inflammatory changes. Spleen:  Splenomegaly. Adrenals/Urinary Tract: Adrenal glands are unremarkable. Kidneys are normal, without renal calculi, focal lesion, or hydronephrosis. Bladder is unremarkable. Stomach/Bowel: Stomach is within normal limits. Appendix appears normal. No evidence of bowel wall thickening, distention, or inflammatory changes. Vascular/Lymphatic: Abdominal aorta is normal in course and caliber. Mildly prominent porta hepatic lymph node measures 15 mm in the short axis (series 11, image 28). Prominent LEFT periaortic lymph node measures 9 mm in the short axis (series 11, image 38). Reproductive: Coarse calcifications of the prostate. Other: No free air or  free fluid. Musculoskeletal: No aggressive osseous lesions. Limbus vertebra of L5. Review of the MIP images confirms the above findings. IMPRESSION: 1. No acute pulmonary embolism. 2. There are multiple heterogeneously enhancing masses throughout the liver, several which demonstrate capsular retraction. No definitive extrahepatic primary is identified. Findings could reflect multifocal hepatocellular carcinoma/cholangiocarcinoma, lymphoma or abscess. Recommend correlation with blood chemistry values. Liver MRI with and without contrast could be of assistance in further characterizing these masses. 3. Splenomegaly. 4. Multiple borderline enlarged lymph nodes above and below the diaphragm. 5. Homogeneously enlarged thyroid gland, nonspecific. Consider further dedicated evaluation with nonemergent thyroid ultrasound. 6. Mild bronchial wall thickening with bibasilar atelectasis. This could reflect a nonspecific bronchitis. These results were called by telephone at the time of interpretation on 03/22/2022 at 4:39 pm to provider Dr. Archie Balboa, who verbally acknowledged these results. Electronically Signed   By: Valentino Saxon M.D.   On: 03/22/2022 16:47   CT Abdomen Pelvis W Contrast  Result Date: 03/22/2022 CLINICAL DATA:  Abdominal pain, acute, nonlocalized; Pulmonary embolism (PE) suspected, high prob EXAM: CT ANGIOGRAPHY CHEST CT ABDOMEN AND PELVIS WITH CONTRAST TECHNIQUE: Multidetector CT imaging of the chest was performed using the standard protocol during bolus administration of intravenous contrast. Multiplanar CT image reconstructions and MIPs were obtained to evaluate the vascular anatomy. Multidetector CT imaging of the abdomen and pelvis was performed using the standard protocol during bolus administration of intravenous contrast. body onc RADIATION DOSE REDUCTION: This exam was performed according to the departmental dose-optimization program which includes automated exposure control, adjustment of the  mA and/or kV according to patient size and/or use of iterative reconstruction technique. CONTRAST:  67m OMNIPAQUE IOHEXOL 350 MG/ML SOLN COMPARISON:  Oct 31, 2014 FINDINGS: CTA CHEST FINDINGS Cardiovascular: Satisfactory opacification of the pulmonary arteries to the segmental level. No evidence of pulmonary embolism. Mildly enlarged heart size. No pericardial effusion. Mediastinum/Nodes: Homogeneously enlarged thyroid gland. There are multiple borderline enlarged mediastinal lymph nodes. RIGHT paratracheal lymph node measures 11 mm in the short axis (series 507, image 55). No axillary adenopathy or hilar adenopathy. Lungs/Pleura: No pleural effusion or pneumothorax. Mild bronchial wall thickening with scattered areas of endobronchial debris. Bibasilar atelectasis. Musculoskeletal: No aggressive osseous abnormality. Review of the MIP images confirms the above findings. CT ABDOMEN and PELVIS FINDINGS Hepatobiliary: There are multiple heterogeneously enhancing masses throughout the liver. Representative mass within hepatic segment 5/6 spans approximately 8.6 cm (series 11, image 33). Representative heterogeneously enhancing mass spanning hepatic segments 6/7 with associated capsular retraction spans at least 11.8 cm (series 14, image 70). Gallbladder is unremarkable. Portal vein is patent. Pancreas: Unremarkable. No pancreatic ductal dilatation or surrounding inflammatory changes. Spleen: Splenomegaly. Adrenals/Urinary Tract: Adrenal glands are unremarkable. Kidneys are normal, without renal calculi, focal lesion, or hydronephrosis. Bladder is unremarkable. Stomach/Bowel: Stomach is within normal limits. Appendix appears normal. No evidence of bowel wall thickening, distention, or inflammatory changes. Vascular/Lymphatic: Abdominal aorta is normal in course and caliber. Mildly prominent porta hepatic lymph  node measures 15 mm in the short axis (series 11, image 28). Prominent LEFT periaortic lymph node measures 9 mm  in the short axis (series 11, image 38). Reproductive: Coarse calcifications of the prostate. Other: No free air or free fluid. Musculoskeletal: No aggressive osseous lesions. Limbus vertebra of L5. Review of the MIP images confirms the above findings. IMPRESSION: 1. No acute pulmonary embolism. 2. There are multiple heterogeneously enhancing masses throughout the liver, several which demonstrate capsular retraction. No definitive extrahepatic primary is identified. Findings could reflect multifocal hepatocellular carcinoma/cholangiocarcinoma, lymphoma or abscess. Recommend correlation with blood chemistry values. Liver MRI with and without contrast could be of assistance in further characterizing these masses. 3. Splenomegaly. 4. Multiple borderline enlarged lymph nodes above and below the diaphragm. 5. Homogeneously enlarged thyroid gland, nonspecific. Consider further dedicated evaluation with nonemergent thyroid ultrasound. 6. Mild bronchial wall thickening with bibasilar atelectasis. This could reflect a nonspecific bronchitis. These results were called by telephone at the time of interpretation on 03/22/2022 at 4:39 pm to provider Dr. Archie Balboa, who verbally acknowledged these results. Electronically Signed   By: Valentino Saxon M.D.   On: 03/22/2022 16:47   CT Head Wo Contrast  Result Date: 03/22/2022 CLINICAL DATA:  Dizziness, persistent/recurrent, cardiac or vascular cause suspected EXAM: CT HEAD WITHOUT CONTRAST TECHNIQUE: Contiguous axial images were obtained from the base of the skull through the vertex without intravenous contrast. RADIATION DOSE REDUCTION: This exam was performed according to the departmental dose-optimization program which includes automated exposure control, adjustment of the mA and/or kV according to patient size and/or use of iterative reconstruction technique. COMPARISON:  Nov 17, 2004 FINDINGS: Brain: No evidence of acute infarction, hemorrhage, hydrocephalus, extra-axial  collection or mass lesion/mass effect. Periventricular white matter hypodensities consistent with sequela of chronic microvascular ischemic disease. Vascular: No hyperdense vessel or unexpected calcification. Skull: No acute fracture. 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 Sinuses/Orbits: No acute finding. Other: None. IMPRESSION: No acute intracranial abnormality. Electronically Signed   By: Valentino Saxon M.D.   On: 03/22/2022 16:14   DG Chest 1 View  Result Date: 03/22/2022 CLINICAL DATA:  Dizziness and right-sided pain at night. Intermittent fevers and unexpected weight loss. EXAM: CHEST  1 VIEW COMPARISON:  Chest radiograph 12/29/2010, 12/13/2007 FINDINGS: Borderline enlargement of the cardiac silhouette, which could exaggerated secondary to portable technique. Subtle interstitial thickening in the lower lungs. No pleural effusion or pneumothorax. Visualized osseous structures are unremarkable. IMPRESSION: Subtle interstitial thickening in the lower lungs may represent subsegmental atelectasis or possibly mild pulmonary edema. No lobar consolidation or pleural effusion. Electronically Signed   By: Ileana Roup M.D.   On: 03/22/2022 13:51    Labs:  CBC: Recent Labs    03/22/22 1152  WBC 12.9*  HGB 12.2*  HCT 38.1*  PLT 361    COAGS: Recent Labs    03/24/22 1019  INR 1.2  APTT 38*    BMP: Recent Labs    03/22/22 1152  NA 138  K 3.9  CL 100  CO2 23  GLUCOSE 180*  BUN 37*  CALCIUM 9.3  CREATININE 1.63*  GFRNONAA 48*    LIVER FUNCTION TESTS: Recent Labs    03/22/22 1152 03/24/22 1019  BILITOT 1.7* 0.8  AST 36 24  ALT 22 18  ALKPHOS 196* 218*  PROT 8.0 7.9  ALBUMIN 3.6 3.4*    TUMOR MARKERS: No results for input(s): "AFPTM", "CEA", "CA199", "CHROMGRNA" in the last 8760 hours.  Assessment and Plan:  Braxley Balandran is a 61 yo male with PMH of acute MI, T2DM, and GERD being seen today in relation to a bilobar hepatic lesions  discovered 03/22/22. Case has been reviewed by Dr Denna Haggard and approved for ultrasound-guided liver biopsy on 03/31/22 with Dr Serafina Royals.   Risks and benefits of ultrasound-guided liver biopsy was discussed with the patient and patient's family including, but not limited to bleeding, infection, damage to adjacent structures or low yield requiring additional tests.  All of the questions were answered and there is agreement to proceed.  Consent signed and in chart.   Thank you for this interesting consult.  I greatly enjoyed meeting ZEKE AKER and look forward to participating in their care.  A copy of this report was sent to the requesting provider on this date.  Electronically Signed: Lura Em, PA 03/31/2022, 7:50 AM   I spent a total of  30 Minutes   in face to face in clinical consultation, greater than 50% of which was counseling/coordinating care for ultrasound-guided liver biopsy.

## 2022-04-02 ENCOUNTER — Ambulatory Visit: Payer: BLUE CROSS/BLUE SHIELD | Admitting: Oncology

## 2022-04-03 ENCOUNTER — Inpatient Hospital Stay: Payer: 59

## 2022-04-03 ENCOUNTER — Encounter: Payer: Self-pay | Admitting: Oncology

## 2022-04-03 ENCOUNTER — Telehealth: Payer: Self-pay

## 2022-04-03 ENCOUNTER — Inpatient Hospital Stay: Payer: 59 | Admitting: Oncology

## 2022-04-03 VITALS — BP 113/90 | HR 96 | Temp 96.8°F | Resp 18 | Wt 165.2 lb

## 2022-04-03 DIAGNOSIS — G893 Neoplasm related pain (acute) (chronic): Secondary | ICD-10-CM

## 2022-04-03 DIAGNOSIS — C8330 Diffuse large B-cell lymphoma, unspecified site: Secondary | ICD-10-CM | POA: Insufficient documentation

## 2022-04-03 DIAGNOSIS — R634 Abnormal weight loss: Secondary | ICD-10-CM | POA: Diagnosis not present

## 2022-04-03 DIAGNOSIS — C8580 Other specified types of non-Hodgkin lymphoma, unspecified site: Secondary | ICD-10-CM

## 2022-04-03 DIAGNOSIS — Z7189 Other specified counseling: Secondary | ICD-10-CM

## 2022-04-03 DIAGNOSIS — C8339 Diffuse large B-cell lymphoma, extranodal and solid organ sites: Secondary | ICD-10-CM | POA: Diagnosis not present

## 2022-04-03 HISTORY — DX: Other specified types of non-hodgkin lymphoma, unspecified site: C85.80

## 2022-04-03 LAB — URIC ACID: Uric Acid, Serum: 7.8 mg/dL (ref 3.7–8.6)

## 2022-04-03 NOTE — Assessment & Plan Note (Signed)
oxycodone PRN

## 2022-04-03 NOTE — Assessment & Plan Note (Signed)
Due to lymphoma.  Encourage nutritional supplementation.  

## 2022-04-03 NOTE — Progress Notes (Signed)
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   Unintentional weight loss Due to lymphoma.  Encourage nutritional supplementation.   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 Pathology result was discussed with patient. Recommend PET scan Chemotherapy is recommended. Check Echo  The diagnosis and care plan were discussed with patient in detail.  NCCN guidelines were reviewed and shared with patient.   The goal of treatment which is to palliate disease, disease related symptoms, improve quality of life and hopefully prolong life was highlighted in our discussion.  Chemotherapy education was provided.  We had discussed the composition of chemotherapy regimen, length of chemo cycle, duration of treatment and the time to assess response to treatment.    I explained to the patient the risks and benefits of chemotherapy including all but not limited to hair loss, mouth sore, nausea, vomiting, diarrhea, heart failrure  low blood counts, bleeding, neuropathy and risk of life threatening infection and even death, secondary malignancy etc.  . Patient voices understanding and willing to proceed chemotherapy.   # Chemotherapy education; Medi- port placement. Antiemetics-Zofran and Compazine; EMLA cream sent to pharmacy  We spent sufficient time to discuss many aspect of care, questions were  answered to patient's satisfaction.    Orders Placed This Encounter  Procedures   NM PET Image Initial (PI) Skull Base To Thigh    Standing Status:   Future    Standing Expiration Date:   04/03/2023    Order Specific Question:   If indicated for the ordered procedure, I authorize the administration of a radiopharmaceutical per Radiology protocol    Answer:   Yes    Order Specific Question:   Preferred imaging location?    Answer:   [facility]   Flow cytometry panel-leukemia/lymphoma work-up    Standing Status:   Future    Number of Occurrences:   1    Standing Expiration Date:   04/04/2023   Uric acid    Standing Status:   Future    Number of Occurrences:   1    Standing Expiration Date:   04/04/2023   ECHOCARDIOGRAM COMPLETE    Standing Status:   Future    Standing Expiration Date:   04/04/2023    Order Specific Question:   Where should this test be performed    Answer:   [facility]    Order Specific Question:   Perflutren DEFINITY (image enhancing agent) should be administered unless hypersensitivity or allergy exist    Answer:   Administer Perflutren    Order Specific Question:   Reason for exam-Echo    Answer:   Chemo  Z09   Follow up to be determined.  All questions were answered. The patient knows to call the clinic with any problems, questions or concerns.  [name], MD, PhD [facility] Hematology Oncology 04/03/2022    CHIEF COMPLAINTS/REASON FOR VISIT:  Evaluation of liver mass  HISTORY OF PRESENTING  ILLNESS:   Joe Adams is a  61 y.o.  male with PMH listed below was seen in consultation at the request of  Ranae Plumber, [place]  for evaluation of liver mass.  03/22/2022, patient presented to emergency room for evaluation of right side lower chest/upper abdomen/flank pain, intermittent low-grade fever and chills, night sweats and unintentional weight loss of 20 to 30 pounds over the past 6 months.  He finished a course of antibiotics a few months ago  for unknown infection and symptom has not improved.  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 No acute pulmonary embolism. 2. There are multiple heterogeneously enhancing masses throughout the liver, several which demonstrate capsular retraction. No definitive extrahepatic primary is identified. Findings could reflect multifocal hepatocellular carcinoma/cholangiocarcinoma, lymphoma or abscess. Recommend correlation with blood chemistry values. Liver MRI with and without contrast could be of assistance in further characterizing these masses. 3. Splenomegaly.4. Multiple borderline enlarged lymph nodes above and below the diaphragm. 5. Homogeneously enlarged thyroid gland, nonspecific. Consider further dedicated evaluation with nonemergent thyroid ultrasound. 6. Mild bronchial wall thickening with bibasilar atelectasis. This could reflect a nonspecific bronchitis.   03/22/2022 Imaging   MRI liver w wo contrast 1. Bilobar hepatic lesions demonstrate irregular contour with minimally increased T2 signal, hypointense intrinsic T1 signal, postcontrast enhancement that is slowly increases and is most prominent in intensities 5 minutes postcontrast administration as well as a peripheral rim of reduced diffusivity.  2. Enlarged upper abdominal lymph nodes, likely reflect metastatic nodal disease involvement. 3. Enhancing lesions in the right tenth rib and L2 vertebral body,concerning for osseous metastatic disease. 4. Prominent subcentimeter pericardiophrenic and internal mammary lymph nodes also suspicious for nodal metastatic disease.    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  Additional IHC is pending for subclassification as well as ancillary FISH testing for rearrangementds     INTERVAL HISTORY RAMIEL FORTI is a 61 y.o. male who has above history reviewed by me today presents for follow up visit for large B-cell lymphoma.  During interval, patient is status post liver mass biopsy and present to discuss results. No new complaints.  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[place]) 04/03/2022   Thyroid disease     SURGICAL HISTORY: Past Surgical History:  Procedure Laterality Date   COLONOSCOPY N/A 11/18/2016   Procedure: COLONOSCOPY IN O.R;  Surgeon: Christene Lye, MD;  Location: [facility] ORS;  Service: General;  Laterality: N/A;   COLONOSCOPY WITH PROPOFOL N/A 11/18/2016   Procedure: COLONOSCOPY WITH PROPOFOL;  Surgeon: Christene Lye, MD;  Location: [facility] ENDOSCOPY;  Service: Endoscopy;  Laterality: N/A;   HEMORRHOID SURGERY N/A 11/18/2016   Procedure: HEMORRHOIDECTOMY;  Surgeon: Christene Lye, MD;  Location: [facility] ORS;  Service: General;  Laterality: N/A;   TONSILLECTOMY     as a child    SOCIAL HISTORY: Social History   Socioeconomic  History   Marital status: Legally Separated    Spouse name: Not on file   Number of children: Not on file   Years of education: Not on file   Highest education level: Not on file  Occupational History   Not on file  Tobacco Use   Smoking status: Never   Smokeless tobacco: Never  Vaping Use   Vaping Use: Never used  Substance and Sexual Activity   Alcohol use: Not Currently    Alcohol/week: 1.0 standard drink of alcohol    Types: 1 Shots of liquor per week    Comment: occ   Drug use: No   Sexual activity: Not on file   Other Topics Concern   Not on file  Social History Narrative   Not on file   Social Determinants of Health   Financial Resource Strain: Not on file  Food Insecurity: Not on file  Transportation Needs: Not on file  Physical Activity: Not on file  Stress: Not on file  Social Connections: Not on file  Intimate Partner Violence: Not on file    FAMILY HISTORY: Family History  Problem Relation Age of Onset   Cancer Other    Cancer Paternal Uncle     ALLERGIES:  has No Known Allergies.  MEDICATIONS:  Current Outpatient Medications  Medication Sig Dispense Refill   Docusate Calcium (STOOL SOFTENER PO) Take by mouth.     lactulose (CHRONULAC) 10 GM/15ML solution Take 15 mLs (10 g total) by mouth daily as needed for mild constipation. 236 mL 0   levothyroxine (SYNTHROID) 150 MCG tablet Take 150 mcg by mouth daily.     metFORMIN (GLUCOPHAGE) 1000 MG tablet 2 (two) times daily.     oxyCODONE-acetaminophen (PERCOCET) 5-325 MG tablet Take 1 tablet by mouth every 6 (six) hours as needed for severe pain. 15 tablet 0   No current facility-administered medications for this visit.    Review of Systems  Constitutional:  Positive for appetite change, fatigue and unexpected weight change. Negative for chills and fever.  HENT:   Negative for hearing loss and voice change.   Eyes:  Negative for eye problems and icterus.  Respiratory:  Negative for chest tightness, cough and shortness of breath.   Cardiovascular:  Negative for chest pain and leg swelling.  Gastrointestinal:  Positive for abdominal pain. Negative for abdominal distention.  Endocrine: Negative for hot flashes.  Genitourinary:  Negative for difficulty urinating, dysuria and frequency.   Musculoskeletal:  Negative for arthralgias.  Skin:  Negative for itching and rash.  Neurological:  Negative for light-headedness and numbness.  Hematological:  Negative for adenopathy. Does not bruise/bleed easily.  Psychiatric/Behavioral:   Negative for confusion.    unable PHYSICAL EXAMINATION: ECOG PERFORMANCE STATUS: 1 - Symptomatic but completely ambulatory Vitals:   04/03/22 0946  BP: (!) 113/90  Pulse: 96  Resp: 18  Temp: (!) 96.8 F (36 C)   Filed Weights   04/03/22 0946  Weight: 165 lb 3.2 oz (74.9 kg)    Physical Exam Constitutional:      General: He is not in acute distress. HENT:     Head: Normocephalic and atraumatic.  Eyes:     General: No scleral icterus. Cardiovascular:     Rate and Rhythm: Normal rate and regular rhythm.     Heart sounds: Normal heart sounds.  Pulmonary:     Effort: Pulmonary effort is normal. No respiratory distress.     Breath sounds: No wheezing.  Abdominal:  General: Bowel sounds are normal. There is no distension.     Palpations: Abdomen is soft.     Tenderness: There is abdominal tenderness.  Musculoskeletal:        General: No deformity. Normal range of motion.     Cervical back: Normal range of motion and neck supple.  Skin:    General: Skin is warm and dry.     Findings: No erythema or rash.  Neurological:     Mental Status: He is alert and oriented to person, place, and time. Mental status is at baseline.     Cranial Nerves: No cranial nerve deficit.     Coordination: Coordination normal.  Psychiatric:        Mood and Affect: Mood normal.     LABORATORY DATA:  I have reviewed the data as listed    Latest Ref Rng & Units 03/31/2022    7:57 AM 03/22/2022   11:52 AM 10/31/2014    3:24 PM  CBC  WBC 4.0 - 10.5 K/uL 10.4  12.9  7.2   Hemoglobin 13.0 - 17.0 g/dL 12.7  12.2  16.1   Hematocrit 39.0 - 52.0 % 38.3  38.1  46.1   Platelets 150 - 400 K/uL 353  361  172       Latest Ref Rng & Units 03/24/2022   10:19 AM 03/22/2022   11:52 AM 10/31/2014    3:24 PM  CMP  Glucose 70 - 99 mg/dL  180  127   BUN 8 - 23 mg/dL  37  24   Creatinine 0.61 - 1.24 mg/dL  1.63  0.92   Sodium 135 - 145 mmol/L  138  139   Potassium 3.5 - 5.1 mmol/L  3.9  3.6   Chloride  98 - 111 mmol/L  100  106   CO2 22 - 32 mmol/L  23  26   Calcium 8.9 - 10.3 mg/dL  9.3  8.9   Total Protein 6.5 - 8.1 g/dL 7.9  8.0  7.3   Total Bilirubin 0.3 - 1.2 mg/dL 0.8  1.7  0.8   Alkaline Phos 38 - 126 U/L 218  196  72   AST 15 - 41 U/L 24  36  34   ALT 0 - 44 U/L 18  22  41       RADIOGRAPHIC STUDIES: I have personally reviewed the radiological images as listed and agreed with the findings in the report. US BIOPSY (LIVER)  Result Date: 03/31/2022 INDICATION: 61 year old male with multifocal liver masses of indeterminate etiology. EXAM: ULTRASOUND GUIDED LIVER LESION BIOPSY COMPARISON:  None Available. MEDICATIONS: None ANESTHESIA/SEDATION: Fentanyl 50 mcg IV; Versed 1 mg IV Total Moderate Sedation time:  10 minutes. The patient's level of consciousness and vital signs were monitored continuously by radiology nursing throughout the procedure under my direct supervision. COMPLICATIONS: None immediate. PROCEDURE: Informed written consent was obtained from the patient after a discussion of the risks, benefits and alternatives to treatment. The patient understands and consents the procedure. A timeout was performed prior to the initiation of the procedure. Ultrasound scanning was performed of the right upper abdominal quadrant demonstrates multifocal heterogeneously solid masses throughout the liver. A subcapsular right lobe mass was selected for biopsy and the procedure was planned. The right upper abdominal quadrant was prepped and draped in the usual sterile fashion. The overlying soft tissues were anesthetized with 1% lidocaine with epinephrine. A 17 gauge, 6.8 cm co-axial needle was advanced into a peripheral aspect of  the lesion. This was followed by 4 core biopsies with an 18 gauge core device under direct ultrasound guidance. The coaxial needle tract was embolized with a small amount of Gel-Foam slurry and superficial hemostasis was obtained with manual compression. Post procedural  scanning was negative for definitive area of hemorrhage or additional complication. A dressing was placed. The patient tolerated the procedure well without immediate post procedural complication. IMPRESSION: Technically successful ultrasound guided core needle biopsy of right lobe liver mass. Ruthann Cancer, MD [facility] Radiology Electronically Signed   By: Ruthann Cancer M.D.   On: 03/31/2022 10:49   MR LIVER W WO CONTRAST  Result Date: 03/22/2022 CLINICAL DATA:  Further evaluation of hepatic lesions seen on prior CT. EXAM: MRI ABDOMEN WITHOUT AND WITH CONTRAST TECHNIQUE: Multiplanar multisequence MR imaging of the abdomen was performed both before and after the administration of intravenous contrast. CONTRAST:  7cc of Vueway COMPARISON:  Multiple priors including most recent CT chest abdomen pelvis dated March 22, 2022. FINDINGS: Lower chest: No acute abnormality. Hepatobiliary: Bilobar hepatic lesions demonstrate irregular contour with minimally increased T2 signal and hypointense intrinsic T1 signal demonstrating postcontrast enhancement which slowly increases over progressive postcontrast pulse sequences most dominant on the 5 minute delayed and with a peripheral rim of reduced diffusivity. For reference: -lesion in the central right lobe of the liver measures 9.2 x 7.2 cm on image 49/13 -segment IVa/VIII hepatic lesion measures 7.9 x 5.4 cm on image 31/13 -segment II hepatic lesion measures 3.4 x 2.7 cm on image 21/13. No evidence of portal venous involvement. Gallbladder is unremarkable.  No biliary ductal dilation. Pancreas: No pancreatic ductal dilation or evidence of acute inflammation. No suspicious pancreatic mass identified. Spleen:  No splenomegaly or focal splenic lesion. Adrenals/Urinary Tract: Bilateral adrenal glands are within normal limits. No hydronephrosis. No solid enhancing renal mass. Stomach/Bowel: Visualized portions within the abdomen  are unremarkable. Vascular/Lymphatic: Normal caliber abdominal aorta. The portal, splenic and superior mesenteric veins are patent. Enlarged retroperitoneal, portacaval, periportal, and gastrohepatic ligament lymph nodes. For reference: -portacaval lymph node measures 13 mm in short axis on image 49/16. -periportal lymph node measures 10 mm in short axis on image 44/16. Prominent subcentimeter pericardiophrenic and internal mammary lymph nodes. For reference: -A lymph node along the inferior aspect of the internal mammary artery on the right below the xiphoid process measures 8 mm in short axis on image 18/22 -A pericardiophrenic lymph node measures 7 mm in short axis on image 88/22. Other:  No significant abdominopelvic free fluid. Musculoskeletal: Enhancing lesion in the right tenth rib on image 38/22. Enhancing lesion in the L2 vertebral body on image 56/22. IMPRESSION: 1. Bilobar hepatic lesions demonstrate irregular contour with minimally increased T2 signal, hypointense intrinsic T1 signal, postcontrast enhancement that is slowly increases and is most prominent in intensities 5 minutes postcontrast administration as well as a peripheral rim of reduced diffusivity. 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 With alternate differential consideration of atypical hepatocellular carcinoma. Recommend oncology referral with multidisciplinary tumor board consultation and definitive diagnosis with direct tissue sampling. 2. Enlarged upper abdominal lymph nodes, likely reflect metastatic nodal disease involvement. 3. Enhancing lesions in the right tenth rib and L2 vertebral body, concerning for osseous metastatic disease. 4. Prominent subcentimeter pericardiophrenic and internal mammary lymph nodes also suspicious for nodal metastatic disease. Electronically Signed   By: Dahlia Bailiff M.D.   On: 03/22/2022 19:52   CT Angio Chest PE W/Cm &/Or Wo Cm  Result Date:  03/22/2022 CLINICAL DATA:  Abdominal pain, acute, nonlocalized; Pulmonary embolism (PE) suspected, high prob EXAM: CT ANGIOGRAPHY CHEST CT ABDOMEN AND PELVIS WITH CONTRAST TECHNIQUE: Multidetector CT imaging of the chest was performed using the standard protocol during bolus administration of intravenous contrast. Multiplanar CT image reconstructions and MIPs were obtained to evaluate the vascular anatomy. Multidetector CT imaging of the abdomen and pelvis was performed using the standard protocol during bolus administration of intravenous contrast. body onc RADIATION DOSE REDUCTION: This exam was performed according to the departmental dose-optimization program which includes automated exposure control, adjustment of the mA and/or kV according to patient size and/or use of iterative reconstruction technique. CONTRAST:  95m OMNIPAQUE IOHEXOL 350 MG/ML SOLN COMPARISON:  Oct 31, 2014 FINDINGS: CTA CHEST FINDINGS Cardiovascular: Satisfactory opacification of the pulmonary arteries to the segmental level. No evidence of pulmonary embolism. Mildly enlarged heart size. No pericardial effusion. Mediastinum/Nodes: Homogeneously enlarged thyroid gland. There are multiple borderline enlarged mediastinal lymph nodes. RIGHT paratracheal lymph node measures 11 mm in the short axis (series 507, image 55). No axillary adenopathy or hilar adenopathy. Lungs/Pleura: No pleural effusion or pneumothorax. Mild bronchial wall thickening with scattered areas of endobronchial debris. Bibasilar atelectasis. Musculoskeletal: No aggressive osseous abnormality. Review of the MIP images confirms the above findings. CT ABDOMEN and PELVIS FINDINGS Hepatobiliary: There are multiple heterogeneously enhancing masses throughout the liver. Representative mass within hepatic segment 5/6 spans approximately 8.6 cm (series 11, image 33). Representative heterogeneously enhancing mass spanning hepatic segments 6/7 with associated capsular retraction spans  at least 11.8 cm (series 14, image 70). Gallbladder is unremarkable. Portal vein is patent. Pancreas: Unremarkable. No pancreatic ductal dilatation or surrounding inflammatory changes. Spleen: Splenomegaly. Adrenals/Urinary Tract: Adrenal glands are unremarkable. Kidneys are normal, without renal calculi, focal lesion, or hydronephrosis. Bladder is unremarkable. Stomach/Bowel: Stomach is within normal limits. Appendix appears normal. No evidence of bowel wall thickening, distention, or inflammatory changes. Vascular/Lymphatic: Abdominal aorta is normal in course and caliber. Mildly prominent porta hepatic lymph node measures 15 mm in the short axis (series 11, image 28). Prominent LEFT periaortic lymph node measures 9 mm in the short axis (series 11, image 38). Reproductive: Coarse calcifications of the prostate. Other: No free air or free fluid. Musculoskeletal: No aggressive osseous lesions. Limbus vertebra of L5. Review of the MIP images confirms the above findings. IMPRESSION: 1. No acute pulmonary embolism. 2. There are multiple heterogeneously enhancing masses throughout the liver, several which demonstrate capsular retraction. No definitive extrahepatic primary is identified. Findings could reflect multifocal hepatocellular carcinoma/cholangiocarcinoma, lymphoma or abscess. Recommend correlation with blood chemistry values. Liver MRI with and without contrast could be of assistance in further characterizing these masses. 3. Splenomegaly. 4. Multiple borderline enlarged lymph nodes above and below the diaphragm. 5. Homogeneously enlarged thyroid gland, nonspecific. Consider further dedicated evaluation with nonemergent thyroid ultrasound. 6. Mild bronchial wall thickening with bibasilar atelectasis. This could reflect a nonspecific bronchitis. These results were called by telephone at the time of interpretation on 03/22/2022 at 4:39 pm to provider Dr. GArchie Balboa who verbally acknowledged these results.  Electronically Signed   By: SValentino SaxonM.D.   On: 03/22/2022 16:47   CT Abdomen Pelvis W Contrast  Result Date: 03/22/2022 CLINICAL DATA:  Abdominal pain, acute, nonlocalized; Pulmonary embolism (PE) suspected, high prob EXAM: CT ANGIOGRAPHY CHEST CT ABDOMEN AND PELVIS WITH CONTRAST TECHNIQUE: Multidetector CT imaging of the chest was performed using the standard protocol during bolus administration of intravenous contrast. Multiplanar CT image reconstructions and MIPs were  obtained to evaluate the vascular anatomy. Multidetector CT imaging of the abdomen and pelvis was performed using the standard protocol during bolus administration of intravenous contrast. body onc RADIATION DOSE REDUCTION: This exam was performed according to the departmental dose-optimization program which includes automated exposure control, adjustment of the mA and/or kV according to patient size and/or use of iterative reconstruction technique. CONTRAST:  9m OMNIPAQUE IOHEXOL 350 MG/ML SOLN COMPARISON:  Oct 31, 2014 FINDINGS: CTA CHEST FINDINGS Cardiovascular: Satisfactory opacification of the pulmonary arteries to the segmental level. No evidence of pulmonary embolism. Mildly enlarged heart size. No pericardial effusion. Mediastinum/Nodes: Homogeneously enlarged thyroid gland. There are multiple borderline enlarged mediastinal lymph nodes. RIGHT paratracheal lymph node measures 11 mm in the short axis (series 507, image 55). No axillary adenopathy or hilar adenopathy. Lungs/Pleura: No pleural effusion or pneumothorax. Mild bronchial wall thickening with scattered areas of endobronchial debris. Bibasilar atelectasis. Musculoskeletal: No aggressive osseous abnormality. Review of the MIP images confirms the above findings. CT ABDOMEN and PELVIS FINDINGS Hepatobiliary: There are multiple heterogeneously enhancing masses throughout the liver. Representative mass within hepatic segment 5/6 spans approximately 8.6 cm (series 11,  image 33). Representative heterogeneously enhancing mass spanning hepatic segments 6/7 with associated capsular retraction spans at least 11.8 cm (series 14, image 70). Gallbladder is unremarkable. Portal vein is patent. Pancreas: Unremarkable. No pancreatic ductal dilatation or surrounding inflammatory changes. Spleen: Splenomegaly. Adrenals/Urinary Tract: Adrenal glands are unremarkable. Kidneys are normal, without renal calculi, focal lesion, or hydronephrosis. Bladder is unremarkable. Stomach/Bowel: Stomach is within normal limits. Appendix appears normal. No evidence of bowel wall thickening, distention, or inflammatory changes. Vascular/Lymphatic: Abdominal aorta is normal in course and caliber. Mildly prominent porta hepatic lymph node measures 15 mm in the short axis (series 11, image 28). Prominent LEFT periaortic lymph node measures 9 mm in the short axis (series 11, image 38). Reproductive: Coarse calcifications of the prostate. Other: No free air or free fluid. Musculoskeletal: No aggressive osseous lesions. Limbus vertebra of L5. Review of the MIP images confirms the above findings. IMPRESSION: 1. No acute pulmonary embolism. 2. There are multiple heterogeneously enhancing masses throughout the liver, several which demonstrate capsular retraction. No definitive extrahepatic primary is identified. Findings could reflect multifocal hepatocellular carcinoma/cholangiocarcinoma, lymphoma or abscess. Recommend correlation with blood chemistry values. Liver MRI with and without contrast could be of assistance in further characterizing these masses. 3. Splenomegaly. 4. Multiple borderline enlarged lymph nodes above and below the diaphragm. 5. Homogeneously enlarged thyroid gland, nonspecific. Consider further dedicated evaluation with nonemergent thyroid ultrasound. 6. Mild bronchial wall thickening with bibasilar atelectasis. This could reflect a nonspecific bronchitis. These results were called by telephone  at the time of interpretation on 03/22/2022 at 4:39 pm to provider Dr. GArchie Balboa who verbally acknowledged these results. Electronically Signed   By: SValentino SaxonM.D.   On: 03/22/2022 16:47   CT Head Wo Contrast  Result Date: 03/22/2022 CLINICAL DATA:  Dizziness, persistent/recurrent, cardiac or vascular cause suspected EXAM: CT HEAD WITHOUT CONTRAST TECHNIQUE: Contiguous axial images were obtained from the base of the skull through the vertex without intravenous contrast. RADIATION DOSE REDUCTION: This exam was performed according to the departmental dose-optimization program which includes automated exposure control, adjustment of the mA and/or kV according to patient size and/or use of iterative reconstruction technique. COMPARISON:  Nov 17, 2004 FINDINGS: Brain: No evidence of acute infarction, hemorrhage, hydrocephalus, extra-axial collection or mass lesion/mass effect. Periventricular white matter hypodensities consistent with sequela of chronic microvascular ischemic disease. Vascular: No hyperdense vessel  or unexpected calcification. Skull: No acute fracture. 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 Sinuses/Orbits: No acute finding. Other: None. IMPRESSION: No acute intracranial abnormality. Electronically Signed   By: Valentino Saxon M.D.   On: 03/22/2022 16:14   DG Chest 1 View  Result Date: 03/22/2022 CLINICAL DATA:  Dizziness and right-sided pain at night. Intermittent fevers and unexpected weight loss. EXAM: CHEST  1 VIEW COMPARISON:  Chest radiograph 12/29/2010, 12/13/2007 FINDINGS: Borderline enlargement of the cardiac silhouette, which could exaggerated secondary to portable technique. Subtle interstitial thickening in the lower lungs. No pleural effusion or pneumothorax. Visualized osseous structures are unremarkable. IMPRESSION: Subtle interstitial thickening in the lower lungs may represent subsegmental atelectasis or possibly mild pulmonary edema. No  lobar consolidation or pleural effusion. Electronically Signed   By: Ileana Roup M.D.   On: 03/22/2022 13:51

## 2022-04-03 NOTE — Assessment & Plan Note (Addendum)
CT and MRI images were reviewed and discussed with patient. Pathology result was discussed with patient. Recommend PET scan Chemotherapy is recommended [ RCHOP vs Dose adjusted EPOCH, depending on additional IHC and FISH testing]. Check Echo  The diagnosis and care plan were discussed with patient in detail.  The goal of treatment which is to palliate disease, disease related symptoms, improve quality of life and hopefully prolong life was highlighted in our discussion.  Chemotherapy education was provided.  We had discussed the composition of chemotherapy regimen, length of chemo cycle, duration of treatment and the time to assess response to treatment.    I explained to the patient the risks and benefits of chemotherapy including all but not limited to hair loss, mouth sore, nausea, vomiting, diarrhea, heart failrure  low blood counts, bleeding, neuropathy and risk of life threatening infection and even death, secondary malignancy etc.  . Patient voices understanding and willing to proceed chemotherapy.   # Chemotherapy education; Medi- port placement. Antiemetics-Zofran and Compazine; EMLA cream sent to pharmacy  We spent sufficient time to discuss many aspect of care, questions were answered to patient's satisfaction.

## 2022-04-03 NOTE — Telephone Encounter (Signed)
Medical clearance for Adriamycin has been faxed to Dr. Etta Quill office.

## 2022-04-03 NOTE — Assessment & Plan Note (Signed)
Discussed with patient

## 2022-04-06 ENCOUNTER — Ambulatory Visit
Admission: RE | Admit: 2022-04-06 | Discharge: 2022-04-06 | Disposition: A | Discharge status: Home / Self Care | Payer: 59 | Source: Ambulatory Visit | Attending: Oncology | Admitting: Oncology

## 2022-04-06 DIAGNOSIS — E119 Type 2 diabetes mellitus without complications: Secondary | ICD-10-CM | POA: Diagnosis not present

## 2022-04-06 DIAGNOSIS — C8580 Other specified types of non-Hodgkin lymphoma, unspecified site: Secondary | ICD-10-CM | POA: Insufficient documentation

## 2022-04-06 DIAGNOSIS — I252 Old myocardial infarction: Secondary | ICD-10-CM | POA: Diagnosis not present

## 2022-04-06 LAB — ECHOCARDIOGRAM COMPLETE
AR max vel: 2.12 cm2
AV Area VTI: 2.1 cm2
AV Area mean vel: 2.05 cm2
AV Mean grad: 4 mmHg
AV Peak grad: 7 mmHg
Ao pk vel: 1.32 m/s
Area-P 1/2: 3.68 cm2
S' Lateral: 2.1 cm

## 2022-04-06 NOTE — Telephone Encounter (Signed)
Cardiology clearance form scanned in media.

## 2022-04-06 NOTE — Telephone Encounter (Signed)
Called Dr. Etta Quill office to follow up on med clearance, Rep states she will relay message and will call back.

## 2022-04-07 ENCOUNTER — Encounter: Payer: BLUE CROSS/BLUE SHIELD | Admitting: Hospice and Palliative Medicine

## 2022-04-07 ENCOUNTER — Ambulatory Visit: Payer: BLUE CROSS/BLUE SHIELD | Admitting: Oncology

## 2022-04-08 ENCOUNTER — Telehealth: Payer: Self-pay

## 2022-04-08 ENCOUNTER — Inpatient Hospital Stay (HOSPITAL_BASED_OUTPATIENT_CLINIC_OR_DEPARTMENT_OTHER): Payer: 59 | Admitting: Hospice and Palliative Medicine

## 2022-04-08 ENCOUNTER — Other Ambulatory Visit: Payer: Self-pay | Admitting: Oncology

## 2022-04-08 ENCOUNTER — Other Ambulatory Visit: Payer: Self-pay

## 2022-04-08 DIAGNOSIS — C8580 Other specified types of non-Hodgkin lymphoma, unspecified site: Secondary | ICD-10-CM

## 2022-04-08 LAB — COMP PANEL: LEUKEMIA/LYMPHOMA

## 2022-04-08 MED ORDER — ALLOPURINOL 100 MG PO TABS: 100.0000 mg | ORAL_TABLET | Freq: Every day | ORAL | 1 refills | Status: DC

## 2022-04-08 MED ORDER — PREDNISONE 20 MG PO TABS: 20.0000 mg | ORAL_TABLET | Freq: Every day | ORAL | 0 refills | Status: DC

## 2022-04-08 NOTE — Progress Notes (Signed)
Multidisciplinary Oncology Council Documentation  Joe Adams was presented by our [facility] on 04/08/2022, which included representatives from:  Palliative Care Dietitian  Physical/Occupational Therapist Nurse Navigator Genetics Speech Therapist Social work Survivorship RN Financial Navigator Research RN   Joe Adams currently presents with history of lymphoma  We reviewed previous medical and familial history, history of present illness, and recent lab results along with all available histopathologic and imaging studies. The [place] considered available treatment options and made the following recommendations/referrals:  SW, nutrition  The MOC is a meeting of clinicians from various specialty areas who evaluate and discuss patients for whom a multidisciplinary approach is being considered. Final determinations in the plan of care are those of the provider(s).   Today's extended care, comprehensive team conference, Joe Adams was not present for the discussion and was not examined.

## 2022-04-08 NOTE — Telephone Encounter (Signed)
Request for port placement faxed to IR.

## 2022-04-09 ENCOUNTER — Inpatient Hospital Stay: Payer: 59

## 2022-04-09 NOTE — Telephone Encounter (Signed)
port insert on Mon 10/30 at 1:30p and arrive at 12:30p. Pt aware of date/time

## 2022-04-14 ENCOUNTER — Other Ambulatory Visit: Payer: Self-pay | Admitting: Oncology

## 2022-04-14 ENCOUNTER — Encounter: Payer: Self-pay | Admitting: Oncology

## 2022-04-14 ENCOUNTER — Inpatient Hospital Stay: Payer: 59 | Admitting: Hospice and Palliative Medicine

## 2022-04-14 DIAGNOSIS — C8330 Diffuse large B-cell lymphoma, unspecified site: Secondary | ICD-10-CM

## 2022-04-14 DIAGNOSIS — C8580 Other specified types of non-Hodgkin lymphoma, unspecified site: Secondary | ICD-10-CM

## 2022-04-14 DIAGNOSIS — C8338 Diffuse large B-cell lymphoma, lymph nodes of multiple sites: Secondary | ICD-10-CM

## 2022-04-14 LAB — SURGICAL PATHOLOGY

## 2022-04-14 MED ORDER — ACYCLOVIR 400 MG PO TABS: 400.0000 mg | ORAL_TABLET | Freq: Every day | ORAL | 3 refills | Status: DC

## 2022-04-14 MED ORDER — PREDNISONE 20 MG PO TABS: 20.0000 mg | ORAL_TABLET | Freq: Every day | ORAL | 0 refills | Status: DC

## 2022-04-14 MED ORDER — LIDOCAINE-PRILOCAINE 2.5-2.5 % EX CREA: TOPICAL_CREAM | CUTANEOUS | 3 refills | Status: DC

## 2022-04-14 MED ORDER — ONDANSETRON HCL 8 MG PO TABS: 8.0000 mg | ORAL_TABLET | Freq: Three times a day (TID) | ORAL | 1 refills | Status: DC | PRN

## 2022-04-14 MED ORDER — PROCHLORPERAZINE MALEATE 10 MG PO TABS: 10.0000 mg | ORAL_TABLET | Freq: Four times a day (QID) | ORAL | 6 refills | Status: DC | PRN

## 2022-04-14 MED ORDER — PREDNISONE 20 MG PO TABS: 100.0000 mg | ORAL_TABLET | Freq: Every day | ORAL | 5 refills | Status: DC

## 2022-04-14 NOTE — Progress Notes (Signed)
START ON PATHWAY REGIMEN - Lymphoma and CLL     Cycles 1 through 6: A cycle is every 21 days:     Prednisone      Rituximab-xxxx      Polatuzumab vedotin-piiq      Cyclophosphamide      Doxorubicin      Pegfilgrastim-xxxx    Cycles 7 and 8: A cycle is every 21 days:     Rituximab-xxxx   **Always confirm dose/schedule in your pharmacy ordering system**  Patient Characteristics: Diffuse Large B-Cell Lymphoma or Follicular Lymphoma, Grade 3B, First Line, Stage III and IV Disease Type: Not Applicable Disease Type: Diffuse Large B-Cell Lymphoma Disease Type: Not Applicable Line of therapy: First Line Intent of Therapy: Curative Intent, Discussed with Patient 

## 2022-04-15 ENCOUNTER — Other Ambulatory Visit: Payer: Self-pay | Admitting: Oncology

## 2022-04-15 ENCOUNTER — Inpatient Hospital Stay: Payer: 59 | Admitting: Licensed Clinical Social Worker

## 2022-04-15 ENCOUNTER — Other Ambulatory Visit: Payer: Self-pay

## 2022-04-15 DIAGNOSIS — C8338 Diffuse large B-cell lymphoma, lymph nodes of multiple sites: Secondary | ICD-10-CM

## 2022-04-15 DIAGNOSIS — C8580 Other specified types of non-Hodgkin lymphoma, unspecified site: Secondary | ICD-10-CM

## 2022-04-15 NOTE — Progress Notes (Signed)
[facility]  Initial Assessment   JAQUILLE KAU is a 61 y.o. year old male contacted by phone. Clinical Social Work was referred by medical provider for assessment of psychosocial needs.   SDOH (Social Determinants of Health) assessments performed: Yes SDOH Interventions    Flowsheet Row Clinical Support from 04/15/2022 in [place] at [place] Interventions   Food Insecurity Interventions Intervention Not Indicated  Housing Interventions Intervention Not Indicated  Transportation Interventions Intervention Not Indicated, Patient Resources (Friends/Family)  Utilities Interventions Intervention Not Indicated  Alcohol Usage Interventions Intervention Not Indicated (Score <7)  Financial Strain Interventions Intervention Not Indicated  Physical Activity Interventions Intervention Not Indicated  Stress Interventions Intervention Not Indicated  Social Connections Interventions Intervention Not Indicated       SDOH Screenings   Food Insecurity: No Food Insecurity (04/15/2022)  Housing: Low Risk  (04/15/2022)  Transportation Needs: No Transportation Needs (04/15/2022)  Utilities: Not At Risk (04/15/2022)  Alcohol Screen: Low Risk  (04/15/2022)  Depression (PHQ2-9): Low Risk  (04/15/2022)  Financial Resource Strain: Low Risk  (04/15/2022)  Recent Concern: Financial Resource Strain - Medium Risk (04/15/2022)  Physical Activity: Inactive (04/15/2022)  Social Connections: Moderately Integrated (04/15/2022)  Stress: Stress Concern Present (04/15/2022)  Tobacco Use: Low Risk  (04/03/2022)     Distress Screen completed: No     No data to display            Family/Social Information:  Housing Arrangement: patient lives with partner  Ron Agee [phone], brother Tanya Marvin is main contact [phone] Family members/support persons in your life? Family, Friends, and Geophysical data processor concerns: no  Employment: Out  on work excuse , working on long term Environmental manager.  Income source: Supported by [facility] and Friends and Currently not working, pending long-term disability Contractor concerns: Yes, current concerns., no immediate concerns, but has general financial concerns Type of concern:  No immediate concerns identified Food access concerns: no Religious or spiritual practice: Not known Services Currently in place:  [place]  Coping/ Adjustment to diagnosis: Patient understands treatment plan and what happens next? 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, academic and Limited social support  Clinical Social Work Clinical Goal(s):  No clinical social work goals at this time  Interventions: Discussed common feeling and emotions when being diagnosed with cancer, and the importance of support during treatment Informed patient of the support team roles and support services at [facility] Provided [facility] contact information and encouraged patient to call with any questions or concerns Referred patient to Designer, jewellery, and [employer] for disability assistance, CSW will email patient consent for to [email or web address], patient also requested CSW contact patient's brother to speak with him about [facility] assistance for the disability application and Provided patient with information about CSW role in patient care and available resources and events/groups.  CSW  will email calendar to patient.   Follow Up Plan: Patient will contact CSW with any support or resource needs and CSW contact patient's brother, as per  patient's request and left a voicemail with contact information and a request for return call. Patient verbalizes understanding of plan: Yes    [employer], LCSW

## 2022-04-16 ENCOUNTER — Ambulatory Visit
Admission: RE | Admit: 2022-04-16 | Discharge: 2022-04-16 | Disposition: A | Discharge status: Home / Self Care | Payer: 59 | Source: Ambulatory Visit | Attending: Oncology | Admitting: Oncology

## 2022-04-16 DIAGNOSIS — C8330 Diffuse large B-cell lymphoma, unspecified site: Secondary | ICD-10-CM | POA: Diagnosis not present

## 2022-04-16 DIAGNOSIS — I700 Atherosclerosis of aorta: Secondary | ICD-10-CM | POA: Insufficient documentation

## 2022-04-16 DIAGNOSIS — K5730 Diverticulosis of large intestine without perforation or abscess without bleeding: Secondary | ICD-10-CM | POA: Diagnosis not present

## 2022-04-16 DIAGNOSIS — R162 Hepatomegaly with splenomegaly, not elsewhere classified: Secondary | ICD-10-CM | POA: Diagnosis not present

## 2022-04-16 DIAGNOSIS — E042 Nontoxic multinodular goiter: Secondary | ICD-10-CM | POA: Insufficient documentation

## 2022-04-16 DIAGNOSIS — C8580 Other specified types of non-Hodgkin lymphoma, unspecified site: Secondary | ICD-10-CM | POA: Diagnosis present

## 2022-04-16 LAB — GLUCOSE, CAPILLARY: Glucose-Capillary: 110 mg/dL — ABNORMAL HIGH (ref 70–99)

## 2022-04-16 MED ORDER — FLUDEOXYGLUCOSE F - 18 (FDG) INJECTION
8.6000 | Freq: Once | INTRAVENOUS | Status: AC | PRN
  Administered 2022-04-16: 9.03 via INTRAVENOUS

## 2022-04-16 NOTE — Progress Notes (Signed)
Spoke with patient regarding any grants that may be available to him, he is going to bring in needed paperwork on Monday 04/20/22.

## 2022-04-17 NOTE — Progress Notes (Signed)
Patient for IR Port Placement on Mon 04/20/22, I called and spoke with the patient on the phone and gave pre-procedure instructions. Pt was made aware to be here at 12:30p at the new entrance, NPO after MN prior to procedure as well as driver post procedure/recovery/discharge. Pt stated understanding. Called 04/17/22

## 2022-04-19 ENCOUNTER — Encounter: Payer: Self-pay | Admitting: Oncology

## 2022-04-19 ENCOUNTER — Other Ambulatory Visit: Payer: Self-pay | Admitting: Oncology

## 2022-04-19 ENCOUNTER — Other Ambulatory Visit: Payer: Self-pay | Admitting: Student

## 2022-04-19 DIAGNOSIS — C8580 Other specified types of non-Hodgkin lymphoma, unspecified site: Secondary | ICD-10-CM

## 2022-04-19 NOTE — H&P (Signed)
Chief Complaint: Patient was seen in consultation today for large B-cell lymphoma  at the request of Yu,Zhou  Referring Physician(s): Yu,Zhou  Supervising Physician: Irish Lack  Patient Status: [facility] - Out-pt  History of Present Illness: Joe Adams is a 61 y.o. male with PMH of MI, DM type II and GERD presented to ED 03/22/2022 complaining of fever, chills, abdominal pain, night sweats and unintentional weight loss.  CT AP at that time showed hepatic lesions.  Patient was referred to IR and underwent liver lesion biopsy 03/31/2022 that was positive for diffuse large B-cell lymphoma.  The patient is followed by oncology and has been referred to IR by Dr. Cathie Hoops for tunneled catheter with port placement for treatment.  Pt denies fever, chills, CP. He endorses baseline SOB, weakness and fatigue.  He is NPO per order.    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   Thyroid disease     Past Surgical History:  Procedure Laterality Date   COLONOSCOPY N/A 11/18/2016   Procedure: COLONOSCOPY IN O.R;  Surgeon: Kieth Brightly, MD;  Location: [facility] ORS;  Service: General;  Laterality: N/A;   COLONOSCOPY WITH PROPOFOL N/A 11/18/2016   Procedure: COLONOSCOPY WITH PROPOFOL;  Surgeon: Kieth Brightly, MD;  Location: [facility] ENDOSCOPY;  Service: Endoscopy;  Laterality: N/A;   HEMORRHOID SURGERY N/A 11/18/2016   Procedure: HEMORRHOIDECTOMY;  Surgeon: Kieth Brightly, MD;  Location: [facility] ORS;  Service: General;  Laterality: N/A;   TONSILLECTOMY     as a child    Allergies: Patient has no known allergies.  Medications: Prior to Admission medications   Medication Sig Start Date End Date Taking? Authorizing Provider  acyclovir (ZOVIRAX) 400 MG tablet Take 1  tablet (400 mg total) by mouth daily. 04/14/22   Rickard Patience, MD  allopurinol (ZYLOPRIM) 100 MG tablet Take 1 tablet (100 mg total) by mouth daily. 04/08/22   Rickard Patience, MD  Docusate Calcium (STOOL SOFTENER PO) Take by mouth.    [provider]  lactulose (CHRONULAC) 10 GM/15ML solution Take 15 mLs (10 g total) by mouth daily as needed for mild constipation. 03/24/22   Rickard Patience, MD  levothyroxine (SYNTHROID) 150 MCG tablet Take 150 mcg by mouth daily. 03/09/22   [provider]  lidocaine-prilocaine (EMLA) cream Apply to affected area once 04/14/22   Rickard Patience, MD  metFORMIN (GLUCOPHAGE) 1000 MG tablet 2 (two) times daily. 08/06/21   [provider]  ondansetron (ZOFRAN) 8 MG tablet Take 1 tablet (8 mg total) by mouth every 8 (eight) hours as needed for nausea or vomiting. Start on the third day after cyclophosphamide chemotherapy. 04/14/22   Rickard Patience, MD  oxyCODONE-acetaminophen (PERCOCET) 5-325 MG tablet Take 1 tablet by mouth every 6 (six) hours as needed for severe pain. 03/22/22 03/22/23  Phineas Semen, MD  predniSONE (DELTASONE) 20 MG tablet Take 5 tablets (100 mg total) by mouth daily. Take with food on days 1-5 of chemotherapy. 04/14/22   Rickard Patience, MD  predniSONE (DELTASONE) 20 MG tablet Take 1 tablet (20 mg total) by mouth daily with breakfast. 04/16/22   Rickard Patience, MD  prochlorperazine (COMPAZINE) 10 MG tablet Take 1 tablet (10 mg total) by mouth every 6 (six) hours as needed for  nausea or vomiting. 04/14/22   Rickard Patience, MD     Family History  Problem Relation Age of Onset   Cancer Other    Cancer Paternal Uncle     Social History   Socioeconomic History   Marital status: Legally Separated    Spouse name: Not on file   Number of children: Not on file   Years of education: Not on file   Highest education level: Not on file  Occupational History   Not on file  Tobacco Use   Smoking status: Never   Smokeless tobacco: Never  Vaping Use   Vaping Use: Never  used  Substance and Sexual Activity   Alcohol use: Not Currently    Alcohol/week: 1.0 standard drink of alcohol    Types: 1 Shots of liquor per week    Comment: occ   Drug use: No   Sexual activity: Not on file  Other Topics Concern   Not on file  Social History Narrative   Not on file   Social Determinants of Health   Financial Resource Strain: Low Risk  (04/15/2022)   Overall Financial Resource Strain (CARDIA)    Difficulty of Paying Living Expenses: Not very hard  Recent Concern: Financial Resource Strain - Medium Risk (04/15/2022)   Overall Financial Resource Strain (CARDIA)    Difficulty of Paying Living Expenses: Somewhat hard  Food Insecurity: No Food Insecurity (04/15/2022)   Hunger Vital Sign    Worried About Running Out of Food in the Last Year: Never true    Ran Out of Food in the Last Year: Never true  Transportation Needs: No Transportation Needs (04/15/2022)   PRAPARE - Administrator, Civil Service (Medical): No    Lack of Transportation (Non-Medical): No  Physical Activity: Inactive (04/15/2022)   Exercise Vital Sign    Days of Exercise per Week: 0 days    Minutes of Exercise per Session: 0 min  Stress: Stress Concern Present (04/15/2022)   [employer] of Occupational Health - Occupational Stress Questionnaire    Feeling of Stress : To some extent  Social Connections: Moderately Integrated (04/15/2022)   Social Connection and Isolation Panel [NHANES]    Frequency of Communication with Friends and Family: Three times a week    Frequency of Social Gatherings with Friends and Family: Twice a week    Attends Religious Services: 1 to 4 times per year    Active Member of [employer] or Organizations: No    Attends Banker Meetings: Never    Marital Status: Living with partner    Review of Systems: A 12 point ROS discussed and pertinent positives are indicated in the HPI above.  All other systems are negative.  Review of Systems   Constitutional:  Positive for fatigue. Negative for appetite change, chills and fever.  Respiratory:  Positive for shortness of breath.   Cardiovascular:  Negative for chest pain and leg swelling.  Gastrointestinal:  Negative for abdominal pain, nausea and vomiting.  Neurological:  Positive for weakness. Negative for headaches.    Vital Signs: BP 121/84   Pulse 79   Temp 97.8 F (36.6 C) (Oral)   Resp 18   Ht 5\' 6"  (1.676 m)   Wt 160 lb (72.6 kg)   SpO2 95%   BMI 25.82 kg/m     Physical Exam Constitutional:      General: He is not in acute distress.    Appearance: Normal appearance. He is not ill-appearing.  HENT:  Head: Normocephalic and atraumatic.     Mouth/Throat:     Mouth: Mucous membranes are dry.     Pharynx: Oropharynx is clear.  Eyes:     Extraocular Movements: Extraocular movements intact.     Pupils: Pupils are equal, round, and reactive to light.  Cardiovascular:     Rate and Rhythm: Normal rate and regular rhythm.     Pulses: Normal pulses.     Heart sounds: Normal heart sounds. No murmur heard. Pulmonary:     Effort: Pulmonary effort is normal. No respiratory distress.     Breath sounds: Normal breath sounds.  Abdominal:     General: Bowel sounds are normal. There is no distension.     Palpations: Abdomen is soft.     Tenderness: There is no abdominal tenderness. There is no guarding.  Musculoskeletal:     Right lower leg: No edema.     Left lower leg: No edema.  Skin:    General: Skin is warm and dry.  Neurological:     Mental Status: He is alert and oriented to person, place, and time.  Psychiatric:        Mood and Affect: Mood normal.        Behavior: Behavior normal.        Thought Content: Thought content normal.        Judgment: Judgment normal.     Imaging:   Labs:  CBC: Recent Labs    03/22/22 1152 03/31/22 0757  WBC 12.9* 10.4  HGB 12.2* 12.7*  HCT 38.1* 38.3*  PLT 361 353    COAGS: Recent Labs    03/24/22 1019  03/31/22 0757  INR 1.2 1.2  APTT 38*  --     BMP: Recent Labs    03/22/22 1152  NA 138  K 3.9  CL 100  CO2 23  GLUCOSE 180*  BUN 37*  CALCIUM 9.3  CREATININE 1.63*  GFRNONAA 48*    LIVER FUNCTION TESTS: Recent Labs    03/22/22 1152 03/24/22 1019  BILITOT 1.7* 0.8  AST 36 24  ALT 22 18  ALKPHOS 196* 218*  PROT 8.0 7.9  ALBUMIN 3.6 3.4*    TUMOR MARKERS: No results for input(s): "AFPTM", "CEA", "CA199", "CHROMGRNA" in the last 8760 hours.  Assessment and Plan: 61 year old male with recently diagnosed diffuse large B-cell lymphoma presents to IR for tunneled catheter with port placement.  Pt resting on stretcher.  He is A&O, calm and pleasant.  He is in no distress.   Risks and benefits of image guided tunneled catheter with port placement with moderate sedation was discussed with the patient including, but not limited to bleeding, infection, pneumothorax, or fibrin sheath development and need for additional procedures.  All of the patient's questions were answered, patient is agreeable to proceed. Consent signed and in chart.   Thank you for this interesting consult.  I greatly enjoyed meeting Joe Adams and look forward to participating in their care.  A copy of this report was sent to the requesting provider on this date.  Electronically Signed: Shon Hough, NP 04/20/2022, 2:55 PM   I spent a total of 20 minutes in face to face in clinical consultation, greater than 50% of which was counseling/coordinating care for diffuse large B-cell lymphoma.

## 2022-04-20 ENCOUNTER — Other Ambulatory Visit: Payer: Self-pay | Admitting: *Deleted

## 2022-04-20 ENCOUNTER — Encounter: Payer: Self-pay | Admitting: Radiology

## 2022-04-20 ENCOUNTER — Ambulatory Visit
Admission: RE | Admit: 2022-04-20 | Discharge: 2022-04-20 | Disposition: A | Discharge status: Home / Self Care | Payer: 59 | Source: Ambulatory Visit | Attending: Oncology | Admitting: Oncology

## 2022-04-20 DIAGNOSIS — C8331 Diffuse large B-cell lymphoma, lymph nodes of head, face, and neck: Secondary | ICD-10-CM | POA: Insufficient documentation

## 2022-04-20 DIAGNOSIS — C8580 Other specified types of non-Hodgkin lymphoma, unspecified site: Secondary | ICD-10-CM

## 2022-04-20 DIAGNOSIS — E119 Type 2 diabetes mellitus without complications: Secondary | ICD-10-CM | POA: Diagnosis not present

## 2022-04-20 DIAGNOSIS — K219 Gastro-esophageal reflux disease without esophagitis: Secondary | ICD-10-CM | POA: Insufficient documentation

## 2022-04-20 HISTORY — PX: IR IMAGING GUIDED PORT INSERTION: IMG5740

## 2022-04-20 MED ORDER — LIDOCAINE-PRILOCAINE 2.5-2.5 % EX CREA: TOPICAL_CREAM | CUTANEOUS | 3 refills | Status: AC

## 2022-04-20 MED ORDER — HEPARIN SOD (PORK) LOCK FLUSH 100 UNIT/ML IV SOLN
INTRAVENOUS | Status: AC
  Administered 2022-04-20: 500 [IU]
  Filled 2022-04-20: qty 5

## 2022-04-20 MED ORDER — SODIUM CHLORIDE 0.9 % IV SOLN
INTRAVENOUS | Status: DC
  Administered 2022-04-20: 1000 mL via INTRAVENOUS

## 2022-04-20 MED ORDER — FENTANYL CITRATE (PF) 100 MCG/2ML IJ SOLN
INTRAMUSCULAR | Status: AC | PRN
  Administered 2022-04-20 (×2): 50 ug via INTRAVENOUS

## 2022-04-20 MED ORDER — FENTANYL CITRATE (PF) 100 MCG/2ML IJ SOLN
INTRAMUSCULAR | Status: AC
  Filled 2022-04-20: qty 2

## 2022-04-20 MED ORDER — LIDOCAINE HCL 1 % IJ SOLN
INTRAMUSCULAR | Status: AC
  Administered 2022-04-20: 11 mL
  Filled 2022-04-20: qty 20

## 2022-04-20 MED ORDER — MIDAZOLAM HCL 2 MG/2ML IJ SOLN
INTRAMUSCULAR | Status: AC | PRN
  Administered 2022-04-20 (×2): 1 mg via INTRAVENOUS

## 2022-04-20 MED ORDER — MIDAZOLAM HCL 2 MG/2ML IJ SOLN
INTRAMUSCULAR | Status: AC
  Filled 2022-04-20: qty 2

## 2022-04-20 NOTE — Discharge Instructions (Signed)
Implanted Port Home Guide  An implanted port is a type of central line that is placed under the skin. Central lines are used to provide IV access when treatment or nutrition needs to be given through a person's veins. Implanted ports are used for long-term IV access. An implanted port may be placed because: You need IV medicine that would be irritating to the small veins in your hands or arms. You need long-term IV medicines, such as antibiotics. You need IV nutrition for a long period. You need frequent blood draws for lab tests. You need dialysis.   Implanted ports are usually placed in the chest area, but they can also be placed in the upper arm, the abdomen, or the leg. An implanted port has two main parts: Reservoir. The reservoir is round and will appear as a small, raised area under your skin. The reservoir is the part where a needle is inserted to give medicines or draw blood. Catheter. The catheter is a thin, flexible tube that extends from the reservoir. The catheter is placed into a large vein. Medicine that is inserted into the reservoir goes into the catheter and then into the vein.   How will I care for my incision  You may shower tomorrow  How is my port accessed? Special steps must be taken to access the port: Before the port is accessed, a numbing cream can be placed on the skin. This helps numb the skin over the port site. Your health care provider uses a sterile technique to access the port. Your health care provider must put on a mask and sterile gloves. The skin over your port is cleaned carefully with an antiseptic and allowed to dry. The port is gently pinched between sterile gloves, and a needle is inserted into the port. Only "non-coring" port needles should be used to access the port. Once the port is accessed, a blood return should be checked. This helps ensure that the port is in the vein and is not clogged. If your port needs to remain accessed for a constant  infusion, a clear (transparent) bandage will be placed over the needle site. The bandage and needle will need to be changed every week, or as directed by your health care provider.   What is flushing? Flushing helps keep the port from getting clogged. Follow your health care provider's instructions on how and when to flush the port. Ports are usually flushed with saline solution or a medicine called heparin. The need for flushing will depend on how the port is used. If the port is used for intermittent medicines or blood draws, the port will need to be flushed: After medicines have been given. After blood has been drawn. As part of routine maintenance. If a constant infusion is running, the port may not need to be flushed.   How long will my port stay implanted? The port can stay in for as long as your health care provider thinks it is needed. When it is time for the port to come out, surgery will be done to remove it. The procedure is similar to the one performed when the port was put in. When should I seek immediate medical care? When you have an implanted port, you should seek immediate medical care if: You notice a bad smell coming from the incision site. You have swelling, redness, or drainage at the incision site. You have more swelling or pain at the port site or the surrounding area. You have a fever that   is not controlled with medicine.   This information is not intended to replace advice given to you by your health care provider. Make sure you discuss any questions you have with your health care provider. Document Released: 06/08/2005 Document Revised: 11/14/2015 Document Reviewed: 02/13/2013 Elsevier Interactive Patient Education  2017 [employer]    

## 2022-04-20 NOTE — Procedures (Signed)
Interventional Radiology Procedure Note  Procedure: Single Lumen Power Port Placement    Access:  Right IJ vein.  Findings: Catheter tip positioned at SVC/RA junction. Port is ready for immediate use.   Complications: None  EBL: < 10 mL  Recommendations:  - Ok to shower in 24 hours - Do not submerge for 7 days - Routine line care   Mihira Tozzi T. Stephany Poorman, M.D Pager:  [phone]   

## 2022-04-21 ENCOUNTER — Telehealth: Payer: Self-pay

## 2022-04-21 ENCOUNTER — Other Ambulatory Visit: Payer: 59

## 2022-04-21 ENCOUNTER — Encounter: Payer: Self-pay | Admitting: Oncology

## 2022-04-21 ENCOUNTER — Inpatient Hospital Stay: Payer: 59

## 2022-04-21 ENCOUNTER — Inpatient Hospital Stay (HOSPITAL_BASED_OUTPATIENT_CLINIC_OR_DEPARTMENT_OTHER): Payer: 59 | Admitting: Oncology

## 2022-04-21 ENCOUNTER — Ambulatory Visit: Payer: 59 | Admitting: Oncology

## 2022-04-21 VITALS — BP 122/89 | HR 88 | Temp 97.7°F | Resp 18 | Ht 66.0 in | Wt 165.9 lb

## 2022-04-21 DIAGNOSIS — R634 Abnormal weight loss: Secondary | ICD-10-CM | POA: Diagnosis not present

## 2022-04-21 DIAGNOSIS — C8580 Other specified types of non-Hodgkin lymphoma, unspecified site: Secondary | ICD-10-CM | POA: Diagnosis not present

## 2022-04-21 DIAGNOSIS — C8330 Diffuse large B-cell lymphoma, unspecified site: Secondary | ICD-10-CM

## 2022-04-21 DIAGNOSIS — Z7189 Other specified counseling: Secondary | ICD-10-CM

## 2022-04-21 DIAGNOSIS — C8339 Diffuse large B-cell lymphoma, extranodal and solid organ sites: Secondary | ICD-10-CM | POA: Diagnosis not present

## 2022-04-21 DIAGNOSIS — C8338 Diffuse large B-cell lymphoma, lymph nodes of multiple sites: Secondary | ICD-10-CM | POA: Diagnosis not present

## 2022-04-21 DIAGNOSIS — Z5111 Encounter for antineoplastic chemotherapy: Secondary | ICD-10-CM

## 2022-04-21 DIAGNOSIS — G893 Neoplasm related pain (acute) (chronic): Secondary | ICD-10-CM | POA: Diagnosis not present

## 2022-04-21 LAB — COMPREHENSIVE METABOLIC PANEL
ALT: 25 U/L (ref 0–44)
AST: 46 U/L — ABNORMAL HIGH (ref 15–41)
Albumin: 3.7 g/dL (ref 3.5–5.0)
Alkaline Phosphatase: 260 U/L — ABNORMAL HIGH (ref 38–126)
Anion gap: 13 (ref 5–15)
BUN: 28 mg/dL — ABNORMAL HIGH (ref 8–23)
CO2: 20 mmol/L — ABNORMAL LOW (ref 22–32)
Calcium: 10.5 mg/dL — ABNORMAL HIGH (ref 8.9–10.3)
Chloride: 102 mmol/L (ref 98–111)
Creatinine, Ser: 0.78 mg/dL (ref 0.61–1.24)
GFR, Estimated: >60 mL/min (ref >60)
Glucose, Bld: 133 mg/dL — ABNORMAL HIGH (ref 70–99)
Potassium: 4.5 mmol/L (ref 3.5–5.1)
Sodium: 135 mmol/L (ref 135–145)
Total Bilirubin: 0.5 mg/dL (ref 0.3–1.2)
Total Protein: 7.8 g/dL (ref 6.5–8.1)

## 2022-04-21 LAB — CBC WITH DIFFERENTIAL/PLATELET
Abs Immature Granulocytes: 0.08 10*3/uL — ABNORMAL HIGH (ref 0.00–0.07)
Basophils Absolute: 0.1 10*3/uL (ref 0.0–0.1)
Basophils Relative: 1 %
Eosinophils Absolute: 0.1 10*3/uL (ref 0.0–0.5)
Eosinophils Relative: 1 %
HCT: 39.9 % (ref 39.0–52.0)
Hemoglobin: 13.1 g/dL (ref 13.0–17.0)
Immature Granulocytes: 1 %
Lymphocytes Relative: 13 %
Lymphs Abs: 1.1 10*3/uL (ref 0.7–4.0)
MCH: 27.6 pg (ref 26.0–34.0)
MCHC: 32.8 g/dL (ref 30.0–36.0)
MCV: 84 fL (ref 80.0–100.0)
Monocytes Absolute: 1 10*3/uL (ref 0.1–1.0)
Monocytes Relative: 12 %
Neutro Abs: 5.8 10*3/uL (ref 1.7–7.7)
Neutrophils Relative %: 72 %
Platelets: 308 10*3/uL (ref 150–400)
RBC: 4.75 MIL/uL (ref 4.22–5.81)
RDW: 14.6 % (ref 11.5–15.5)
WBC: 8 10*3/uL (ref 4.0–10.5)
nRBC: 0 % (ref 0.0–0.2)

## 2022-04-21 LAB — GLUCOSE, CAPILLARY: Glucose-Capillary: 76 mg/dL (ref 70–99)

## 2022-04-21 MED ORDER — ACYCLOVIR 400 MG PO TABS: 400.0000 mg | ORAL_TABLET | Freq: Every day | ORAL | 3 refills | Status: DC

## 2022-04-21 MED ORDER — PREDNISONE 20 MG PO TABS: 100.0000 mg | ORAL_TABLET | Freq: Every day | ORAL | 5 refills | Status: DC

## 2022-04-21 MED ORDER — ONDANSETRON HCL 8 MG PO TABS: 8.0000 mg | ORAL_TABLET | Freq: Three times a day (TID) | ORAL | 1 refills | Status: DC | PRN

## 2022-04-21 MED ORDER — PROCHLORPERAZINE MALEATE 10 MG PO TABS: 10.0000 mg | ORAL_TABLET | Freq: Four times a day (QID) | ORAL | 6 refills | Status: DC | PRN

## 2022-04-21 MED FILL — Fosaprepitant Dimeglumine For IV Infusion 150 MG (Base Eq): INTRAVENOUS | Qty: 5 | Status: AC

## 2022-04-21 NOTE — Telephone Encounter (Signed)
Request for bone marrow biospy faxed to IR.   Pt has been scheduled for 11/13 @ 8:30a with arrival time 7:30a. Pt aware of appt.

## 2022-04-21 NOTE — Assessment & Plan Note (Signed)
Due to lymphoma.  Encourage nutritional supplementation.

## 2022-04-21 NOTE — Assessment & Plan Note (Addendum)
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  PET scan was reviewed and discussed with patient.- liver, spleen, possible  thyroid,  involvements.  Marrow is negative on PET. Obtain bone marrow biopsy.  IPI score is 4.  CNS IPI - consider adding IT MTX along with Cycle 2 treatment. Recommend R-Pola -CHP,  Rationale and side effects were reviewed and discussed with patient. He agrees with the plan.  Labs are reviewed and discussed with patient. Proceed with cycle 1 R-PolaCHP.  Patient to take Prednisone 132m D2-5  D3 Udenyca, rationale and side effects were discussed, Claritin 180mdaily x 4 days.   #Antiemetics-Zofran and Compazine; EMLA cream, Prednisone sent to pharmacy. Medication directions were discussed in details with patient.   Recommend patient to take allopurinol and acyclovir.

## 2022-04-21 NOTE — Progress Notes (Signed)
Hematology/Oncology Progress note [phone] [phone]            Patient Care Team: Ranae Plumber, [place] as PCP - General (Family Medicine) Ranae Plumber, [place] (Family Medicine) Christene Lye, MD (General Surgery) Clent Jacks, RN as Oncology Nurse Navigator  ASSESSMENT & PLAN:   Cancer Staging  Diffuse large B cell lymphoma [facility]) Staging form: Hodgkin and Non-Hodgkin Lymphoma, AJCC 8th Edition - Clinical stage from 03/31/2022: Stage IV (Diffuse large B-cell lymphoma) - Signed by [name], MD on 04/19/2022   Diffuse large B cell lymphoma ([place]) BCL6 cmyc IHC positive, FISH negative for BCL2, BCL6, and CMYC rearrangement.  PET scan was reviewed and discussed with patient.- liver, spleen, possible  thyroid,  involvements.  Marrow is negative on PET. Obtain bone marrow biopsy.  IPI score is 4.  CNS IPI - consider adding IT MTX along with Cycle 2 treatment. Recommend R-Pola -CHP,  Rationale and side effects were reviewed and discussed with patient. He agrees with the plan.  Labs are reviewed and discussed with patient. Proceed with cycle 1 R-PolaCHP.  Patient to take Prednisone 117m D2-5  D3 Udenyca, rationale and side effects were discussed, Claritin 136mdaily x 4 days.   #Antiemetics-Zofran and Compazine; EMLA cream, Prednisone sent to pharmacy. Medication directions were discussed in details with patient.   Recommend patient to take allopurinol and acyclovir.   Unintentional weight loss Due to lymphoma.  Encourage nutritional supplementation.   Neoplasm related pain oxycodone PRN as directed  Goals of care, counseling/discussion Discussed with patient.   Encounter for antineoplastic chemotherapy Treatment plan as listed above  Hypercalcemia Malignancy related, encourge oral hydration.     Orders Placed This Encounter  Procedures   CT BONE MARROW BIOPSY & ASPIRATION    Standing Status:   Future    Standing Expiration  Date:   04/22/2023    Order Specific Question:   Reason for Exam (SYMPTOM  OR DIAGNOSIS REQUIRED)    Answer:   Large cell lymphoma    Order Specific Question:   Preferred location?    Answer:   [facility]    Order Specific Question:   Radiology Contrast Protocol - do NOT remove file path    Answer:   [email or web address]   CBC with Differential/Platelet    Standing Status:   Future    Standing Expiration Date:   04/21/2023   Comprehensive metabolic panel    Standing Status:   Future    Standing Expiration Date:   04/21/2023   Follow up  1 week lab MD IVF 3 weeks lab MD R- pola CHP D3 udenyca  All questions were answered. The patient knows to call the clinic with any problems, questions or concerns.  ZhEarlie ServerMD, PhD [facility] Oncology 04/21/2022    CHIEF COMPLAINTS/REASON FOR VISIT:  Evaluation of liver mass  HISTORY OF PRESENTING ILLNESS:   Joe Adams a  [age].o.  male with PMH listed below was seen in consultation at the request of  YuEarlie ServerMD  for evaluation of liver mass.  03/22/2022, patient presented to emergency room for evaluation of right side lower chest/upper abdomen/flank pain, intermittent low-grade fever and chills, night sweats and unintentional weight loss of 20 to 30 pounds over the past 6 months.  He finished a course of antibiotics a few months ago for unknown infection and symptom has not improved.  Oncology History  Diff[place] cell [place] 03/22/20[place] Imaging  CT chest angiogram, CT abdomen pelvis w contrast  1. No acute pulmonary embolism. 2. There are multiple heterogeneously enhancing masses throughout the liver, several which demonstrate capsular retraction. No definitive extrahepatic primary is identified. Findings could reflect multifocal hepatocellular carcinoma/cholangiocarcinoma, lymphoma or abscess. Recommend correlation with blood chemistry values. Liver MRI with and  without contrast could be of assistance in further characterizing these masses. 3. Splenomegaly.4. Multiple borderline enlarged lymph nodes above and below the diaphragm. 5. Homogeneously enlarged thyroid gland, nonspecific. Consider further dedicated evaluation with nonemergent thyroid ultrasound. 6. Mild bronchial wall thickening with bibasilar atelectasis. This could reflect a nonspecific bronchitis.   03/22/2022 Imaging   MRI liver w wo contrast 1. Bilobar hepatic lesions demonstrate irregular contour with minimally increased T2 signal, hypointense intrinsic T1 signal, postcontrast enhancement that is slowly increases and is most prominent in intensities 5 minutes postcontrast administration as well as a peripheral rim of reduced diffusivity.  2. Enlarged upper abdominal lymph nodes, likely reflect metastatic nodal disease involvement. 3. Enhancing lesions in the right tenth rib and L2 vertebral body,concerning for osseous metastatic disease. 4. Prominent subcentimeter pericardiophrenic and internal mammary lymph nodes also suspicious for nodal metastatic disease.    03/31/2022 Cancer Staging   Staging form: Hodgkin and Non-Hodgkin Lymphoma, AJCC 8th Edition - Clinical stage from 03/31/2022: Stage IV (Diffuse large B-cell lymphoma) - Signed by [name], MD on 04/19/2022 Stage prefix: Initial diagnosis International Prognostic Index (IPI) score: Score 4 International Prognostic Index risk group: High risk DLBCL cell of origin (COO): Activated B-cell   04/03/2022 Initial Diagnosis   Large cell lymphoma ([place])  04/03/2022, ultrasound-guided liver biopsy showed large B-cell lymphoma.  Additional IHC is pending for subclassification as well as ancillary FISH testing for rearrangementds   04/16/2022 Imaging   PET initial staging showed 1. Multiple bulky intensely hypermetabolic liver masses throughout both liver lobes, compatible with reported lymphoma. Deauville score 5. 2. Mild  splenomegaly with mild splenic hypermetabolism, compatible with lymphoma. No enlarged or hypermetabolic lymph nodes. No hypermetabolic skeletal activity. 3. 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 4. Chronic findings include: Aortic Atherosclerosis.Mild sigmoid diverticulosis.     04/22/2022 -  Chemotherapy   Patient is on Treatment Plan : [place] CHP q21d       INTERVAL HISTORY Joe Adams is a 61 y.o. male who has above history reviewed by me today presents for follow up visit for large B-cell lymphoma.  During interval, patient is status post liver mass biopsy and present to discuss results. No new complaints.  MEDICAL HISTORY:  Past Medical History:  Diagnosis Date   Acute MI ([place]) 2005   Diabetes mellitus without complication (HCC)    GERD (gastroesophageal reflux disease)    Headache    Hernia, umbilical    History of kidney stones    Large cell lymphoma ([place]) 04/03/2022   Thyroid disease     SURGICAL HISTORY: Past Surgical History:  Procedure Laterality Date   COLONOSCOPY N/A 11/18/2016   Procedure: COLONOSCOPY IN O.R;  Surgeon: Christene Lye, MD;  Location: [facility] ORS;  Service: General;  Laterality: N/A;   COLONOSCOPY WITH PROPOFOL N/A 11/18/2016   Procedure: COLONOSCOPY WITH PROPOFOL;  Surgeon: Christene Lye, MD;  Location: [facility] ENDOSCOPY;  Service: Endoscopy;  Laterality: N/A;   HEMORRHOID SURGERY N/A 11/18/2016   Procedure: HEMORRHOIDECTOMY;  Surgeon: Christene Lye, MD;  Location: [facility] ORS;  Service: General;  Laterality: N/A;   IR IMAGING GUIDED PORT INSERTION  04/20/2022   TONSILLECTOMY     as a child    SOCIAL HISTORY: Social History   Socioeconomic History   Marital status: Legally Separated    Spouse name: Not on file   Number of children: Not on file   Years of education: Not on file   Highest education level: Not on file  Occupational History    Not on file  Tobacco Use   Smoking status: Never   Smokeless tobacco: Never  Vaping Use   Vaping Use: Never used  Substance and Sexual Activity   Alcohol use: Not Currently    Alcohol/week: 1.0 standard drink of alcohol    Types: 1 Shots of liquor per week    Comment: occ   Drug use: No   Sexual activity: Not on file  Other Topics Concern   Not on file  Social History Narrative   Not on file   Social Determinants of Health   Financial Resource Strain: Low Risk  (04/15/2022)   Overall Financial Resource Strain (CARDIA)    Difficulty of Paying Living Expenses: Not very hard  Recent Concern: Financial Resource Strain - Medium Risk (04/15/2022)   Overall Financial Resource Strain (CARDIA)    Difficulty of Paying Living Expenses: Somewhat hard  Food Insecurity: No Food Insecurity (04/15/2022)   Hunger Vital Sign    Worried About [place] in the Last Year: Never true    Ran Out of Food in the Last Year: Never true  Transportation Needs: No Transportation Needs (04/15/2022)   PRAPARE - Hydrologist (Medical): No    Lack of Transportation (Non-Medical): No  Physical Activity: Inactive (04/15/2022)   Exercise Vital Sign    Days of Exercise per Week: 0 days    Minutes of Exercise per Session: 0 min  Stress: Stress Concern Present (04/15/2022)   [place]    Feeling of Stress : To some extent  Social Connections: Moderately Integrated (04/15/2022)   Social Connection and Isolation Panel [NHANES]    Frequency of Communication with Friends and Family: Three times a week    Frequency of Social Gatherings with Friends and Family: Twice a week    Attends Religious Services: 1 to 4 times per year    Active Member of [employer] or Organizations: No    Attends Archivist Meetings: Never    Marital Status: Living with partner  Intimate Partner Violence: Not At Risk  (04/15/2022)   Humiliation, Afraid, Rape, and Kick questionnaire    Fear of Current or Ex-Partner: No    Emotionally Abused: No    Physically Abused: No    Sexually Abused: No    FAMILY HISTORY: Family History  Problem Relation Age of Onset   Cancer Other    Cancer Paternal Uncle     ALLERGIES:  has No Known Allergies.  MEDICATIONS:  Current Outpatient Medications  Medication Sig Dispense Refill   allopurinol (ZYLOPRIM) 100 MG tablet Take 1 tablet (100 mg total) by mouth daily. 30 tablet 1   Docusate Calcium (STOOL SOFTENER PO) Take by mouth.     lactulose (CHRONULAC) 10 GM/15ML solution Take 15 mLs (10 g total) by mouth daily as needed for mild constipation. 236 mL 0   levothyroxine (SYNTHROID) 150 MCG tablet Take 150 mcg by mouth daily.     metFORMIN (GLUCOPHAGE) 1000 MG tablet 2 (two) times daily.     oxyCODONE-acetaminophen (PERCOCET) 5-325 MG tablet  Take 1 tablet by mouth every 6 (six) hours as needed for severe pain. 15 tablet 0   acyclovir (ZOVIRAX) 400 MG tablet Take 1 tablet (400 mg total) by mouth daily. 30 tablet 3   lidocaine-prilocaine (EMLA) cream Apply to affected area once (Patient not taking: Reported on 04/21/2022) 30 g 3   ondansetron (ZOFRAN) 8 MG tablet Take 1 tablet (8 mg total) by mouth every 8 (eight) hours as needed for nausea or vomiting. Start on the third day after cyclophosphamide chemotherapy. 30 tablet 1   predniSONE (DELTASONE) 20 MG tablet Take 5 tablets (100 mg total) by mouth daily. Take with food on days 2-5 of chemotherapy. 20 tablet 5   prochlorperazine (COMPAZINE) 10 MG tablet Take 1 tablet (10 mg total) by mouth every 6 (six) hours as needed for nausea or vomiting. 30 tablet 6   No current facility-administered medications for this visit.    Review of Systems  Constitutional:  Positive for appetite change, fatigue and unexpected weight change. Negative for chills and fever.  HENT:   Negative for hearing loss and voice change.   Eyes:   Negative for eye problems and icterus.  Respiratory:  Negative for chest tightness, cough and shortness of breath.   Cardiovascular:  Negative for chest pain and leg swelling.  Gastrointestinal:  Positive for abdominal pain. Negative for abdominal distention.  Endocrine: Negative for hot flashes.  Genitourinary:  Negative for difficulty urinating, dysuria and frequency.   Musculoskeletal:  Negative for arthralgias.  Skin:  Negative for itching and rash.  Neurological:  Negative for light-headedness and numbness.  Hematological:  Negative for adenopathy. Does not bruise/bleed easily.  Psychiatric/Behavioral:  Negative for confusion.    unable PHYSICAL EXAMINATION: ECOG PERFORMANCE STATUS: 1 - Symptomatic but completely ambulatory Vitals:   04/21/22 0913  BP: 122/89  Pulse: 88  Resp: 18  Temp: 97.7 F (36.5 C)  SpO2: 99%   Filed Weights   04/21/22 0913  Weight: 165 lb 14.4 oz (75.3 kg)    Physical Exam Constitutional:      General: He is not in acute distress. HENT:     Head: Normocephalic and atraumatic.  Eyes:     General: No scleral icterus. Cardiovascular:     Rate and Rhythm: Normal rate and regular rhythm.     Heart sounds: Normal heart sounds.  Pulmonary:     Effort: Pulmonary effort is normal. No respiratory distress.     Breath sounds: No wheezing.  Abdominal:     General: Bowel sounds are normal. There is no distension.     Palpations: Abdomen is soft.     Tenderness: There is abdominal tenderness.  Musculoskeletal:        General: No deformity. Normal range of motion.     Cervical back: Normal range of motion and neck supple.  Skin:    General: Skin is warm and dry.     Findings: No erythema or rash.  Neurological:     Mental Status: He is alert and oriented to person, place, and time. Mental status is at baseline.     Cranial Nerves: No cranial nerve deficit.     Coordination: Coordination normal.  Psychiatric:        Mood and Affect: Mood normal.      LABORATORY DATA:  I have reviewed the data as listed    Latest Ref Rng & Units 04/21/2022    8:48 AM 03/31/2022    7:57 AM 03/22/2022   11:52 AM  CBC  WBC 4.0 - 10.5 K/uL 8.0  10.4  12.9   Hemoglobin 13.0 - 17.0 g/dL 13.1  12.7  12.2   Hematocrit 39.0 - 52.0 % 39.9  38.3  38.1   Platelets 150 - 400 K/uL 308  353  361       Latest Ref Rng & Units 04/21/2022    8:48 AM 03/24/2022   10:19 AM 03/22/2022   11:52 AM  CMP  Glucose 70 - 99 mg/dL 133   180   BUN 8 - 23 mg/dL 28   37   Creatinine 0.61 - 1.24 mg/dL 0.78   1.63   Sodium 135 - 145 mmol/L 135   138   Potassium 3.5 - 5.1 mmol/L 4.5   3.9   Chloride 98 - 111 mmol/L 102   100   CO2 22 - 32 mmol/L 20   23   Calcium 8.9 - 10.3 mg/dL 10.5   9.3   Total Protein 6.5 - 8.1 g/dL 7.8  7.9  8.0   Total Bilirubin 0.3 - 1.2 mg/dL 0.5  0.8  1.7   Alkaline Phos 38 - 126 U/L 260  218  196   AST 15 - 41 U/L 46  24  36   ALT 0 - 44 U/L _0 RADIOGRAPHIC STUDIES: I have personally reviewed the radiological images as listed and agreed with the findings in the report. US BIOPSY (LIVER)  Result Date: 03/31/2022 INDICATION: 61 year old male with multifocal liver masses of indeterminate etiology. EXAM: ULTRASOUND GUIDED LIVER LESION BIOPSY COMPARISON:  None Available. MEDICATIONS: None ANESTHESIA/SEDATION: Fentanyl 50 mcg IV; Versed 1 mg IV Total Moderate Sedation time:  10 minutes. The patient's level of consciousness and vital signs were monitored continuously by radiology nursing throughout the procedure under my direct supervision. COMPLICATIONS: None immediate. PROCEDURE: Informed written consent was obtained from the patient after a discussion of the risks, benefits and alternatives to treatment. The patient understands and consents the procedure. A timeout was performed prior to the initiation of the procedure. Ultrasound scanning was performed of the right upper abdominal quadrant demonstrates multifocal heterogeneously  solid masses throughout the liver. A subcapsular right lobe mass was selected for biopsy and the procedure was planned. The right upper abdominal quadrant was prepped and draped in the usual sterile fashion. The overlying soft tissues were anesthetized with 1% lidocaine with epinephrine. A 17 gauge, 6.8 cm co-axial needle was advanced into a peripheral aspect of the lesion. This was followed by 4 core biopsies with an 18 gauge core device under direct ultrasound guidance. The coaxial needle tract was embolized with a small amount of Gel-Foam slurry and superficial hemostasis was obtained with manual compression. Post procedural scanning was negative for definitive area of hemorrhage or additional complication. A dressing was placed. The patient tolerated the procedure well without immediate post procedural complication. IMPRESSION: Technically successful ultrasound guided core needle biopsy of right lobe liver mass. Ruthann Cancer, MD [facility] Radiology Electronically Signed   By: Ruthann Cancer M.D.   On: 03/31/2022 10:49   MR LIVER W WO CONTRAST  Result Date: 03/22/2022 CLINICAL DATA:  Further evaluation of hepatic lesions seen on prior CT. EXAM: MRI ABDOMEN WITHOUT AND WITH CONTRAST TECHNIQUE: Multiplanar multisequence MR imaging of the abdomen was performed both before and after the administration of intravenous contrast. CONTRAST:  7cc of [place] COMPARISON:  Multiple priors including most recent CT chest abdomen  pelvis dated March 22, 2022. FINDINGS: Lower chest: No acute abnormality. Hepatobiliary: Bilobar hepatic lesions demonstrate irregular contour with minimally increased T2 signal and hypointense intrinsic T1 signal demonstrating postcontrast enhancement which slowly increases over progressive postcontrast pulse sequences most dominant on the 5 minute delayed and with a peripheral rim of reduced diffusivity. For reference: -lesion in the central right  lobe of the liver measures 9.2 x 7.2 cm on image 49/13 -segment IVa/VIII hepatic lesion measures 7.9 x 5.4 cm on image 31/13 -segment II hepatic lesion measures 3.4 x 2.7 cm on image 21/13. No evidence of portal venous involvement. Gallbladder is unremarkable.  No biliary ductal dilation. Pancreas: No pancreatic ductal dilation or evidence of acute inflammation. No suspicious pancreatic mass identified. Spleen:  No splenomegaly or focal splenic lesion. Adrenals/Urinary Tract: Bilateral adrenal glands are within normal limits. No hydronephrosis. No solid enhancing renal mass. Stomach/Bowel: Visualized portions within the abdomen are unremarkable. Vascular/Lymphatic: Normal caliber abdominal aorta. The portal, splenic and superior mesenteric veins are patent. Enlarged retroperitoneal, portacaval, periportal, and gastrohepatic ligament lymph nodes. For reference: -portacaval lymph node measures 13 mm in short axis on image 49/16. -periportal lymph node measures 10 mm in short axis on image 44/16. Prominent subcentimeter pericardiophrenic and internal mammary lymph nodes. For reference: -A lymph node along the inferior aspect of the internal mammary artery on the right below the xiphoid process measures 8 mm in short axis on image 18/22 -A pericardiophrenic lymph node measures 7 mm in short axis on image 88/22. Other:  No significant abdominopelvic free fluid. Musculoskeletal: Enhancing lesion in the right tenth rib on image 38/22. Enhancing lesion in the L2 vertebral body on image 56/22. IMPRESSION: 1. Bilobar hepatic lesions demonstrate irregular contour with minimally increased T2 signal, hypointense intrinsic T1 signal, postcontrast enhancement that is slowly increases and is most prominent in intensities 5 minutes postcontrast administration as well as a peripheral rim of reduced diffusivity. 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 With alternate differential consideration  of atypical hepatocellular carcinoma. Recommend oncology referral with multidisciplinary tumor board consultation and definitive diagnosis with direct tissue sampling. 2. Enlarged upper abdominal lymph nodes, likely reflect metastatic nodal disease involvement. 3. Enhancing lesions in the right tenth rib and L2 vertebral body, concerning for osseous metastatic disease. 4. Prominent subcentimeter pericardiophrenic and internal mammary lymph nodes also suspicious for nodal metastatic disease. Electronically Signed   By: Dahlia Bailiff M.D.   On: 03/22/2022 19:52   CT Angio Chest PE W/Cm &/Or Wo Cm  Result Date: 03/22/2022 CLINICAL DATA:  Abdominal pain, acute, nonlocalized; Pulmonary embolism (PE) suspected, high prob EXAM: CT ANGIOGRAPHY CHEST CT ABDOMEN AND PELVIS WITH CONTRAST TECHNIQUE: Multidetector CT imaging of the chest was performed using the standard protocol during bolus administration of intravenous contrast. Multiplanar CT image reconstructions and MIPs were obtained to evaluate the vascular anatomy. Multidetector CT imaging of the abdomen and pelvis was performed using the standard protocol during bolus administration of intravenous contrast. body onc RADIATION DOSE REDUCTION: This exam was performed according to the departmental dose-optimization program which includes automated exposure control, adjustment of the mA and/or kV according to patient size and/or use of iterative reconstruction technique. CONTRAST:  53m OMNIPAQUE IOHEXOL 350 MG/ML SOLN COMPARISON:  Oct 31, 2014 FINDINGS: CTA CHEST FINDINGS Cardiovascular: Satisfactory opacification of the pulmonary arteries to the segmental level. No evidence of pulmonary embolism. Mildly enlarged heart size. No pericardial effusion. Mediastinum/Nodes: Homogeneously enlarged thyroid gland. There are multiple borderline enlarged mediastinal lymph nodes. RIGHT paratracheal  lymph node measures 11 mm in the short axis (series 507, image 55). No axillary  adenopathy or hilar adenopathy. Lungs/Pleura: No pleural effusion or pneumothorax. Mild bronchial wall thickening with scattered areas of endobronchial debris. Bibasilar atelectasis. Musculoskeletal: No aggressive osseous abnormality. Review of the MIP images confirms the above findings. CT ABDOMEN and PELVIS FINDINGS Hepatobiliary: There are multiple heterogeneously enhancing masses throughout the liver. Representative mass within hepatic segment 5/6 spans approximately 8.6 cm (series 11, image 33). Representative heterogeneously enhancing mass spanning hepatic segments 6/7 with associated capsular retraction spans at least 11.8 cm (series 14, image 70). Gallbladder is unremarkable. Portal vein is patent. Pancreas: Unremarkable. No pancreatic ductal dilatation or surrounding inflammatory changes. Spleen: Splenomegaly. Adrenals/Urinary Tract: Adrenal glands are unremarkable. Kidneys are normal, without renal calculi, focal lesion, or hydronephrosis. Bladder is unremarkable. Stomach/Bowel: Stomach is within normal limits. Appendix appears normal. No evidence of bowel wall thickening, distention, or inflammatory changes. Vascular/Lymphatic: Abdominal aorta is normal in course and caliber. Mildly prominent porta hepatic lymph node measures 15 mm in the short axis (series 11, image 28). Prominent LEFT periaortic lymph node measures 9 mm in the short axis (series 11, image 38). Reproductive: Coarse calcifications of the prostate. Other: No free air or free fluid. Musculoskeletal: No aggressive osseous lesions. Limbus vertebra of L5. Review of the MIP images confirms the above findings. IMPRESSION: 1. No acute pulmonary embolism. 2. There are multiple heterogeneously enhancing masses throughout the liver, several which demonstrate capsular retraction. No definitive extrahepatic primary is identified. Findings could reflect multifocal hepatocellular carcinoma/cholangiocarcinoma, lymphoma or abscess. Recommend correlation  with blood chemistry values. Liver MRI with and without contrast could be of assistance in further characterizing these masses. 3. Splenomegaly. 4. Multiple borderline enlarged lymph nodes above and below the diaphragm. 5. Homogeneously enlarged thyroid gland, nonspecific. Consider further dedicated evaluation with nonemergent thyroid ultrasound. 6. Mild bronchial wall thickening with bibasilar atelectasis. This could reflect a nonspecific bronchitis. These results were called by telephone at the time of interpretation on 03/22/2022 at 4:39 pm to provider Dr. Archie Balboa, who verbally acknowledged these results. Electronically Signed   By: Valentino Saxon M.D.   On: 03/22/2022 16:47   CT Abdomen Pelvis W Contrast  Result Date: 03/22/2022 CLINICAL DATA:  Abdominal pain, acute, nonlocalized; Pulmonary embolism (PE) suspected, high prob EXAM: CT ANGIOGRAPHY CHEST CT ABDOMEN AND PELVIS WITH CONTRAST TECHNIQUE: Multidetector CT imaging of the chest was performed using the standard protocol during bolus administration of intravenous contrast. Multiplanar CT image reconstructions and MIPs were obtained to evaluate the vascular anatomy. Multidetector CT imaging of the abdomen and pelvis was performed using the standard protocol during bolus administration of intravenous contrast. body onc RADIATION DOSE REDUCTION: This exam was performed according to the departmental dose-optimization program which includes automated exposure control, adjustment of the mA and/or kV according to patient size and/or use of iterative reconstruction technique. CONTRAST:  59m OMNIPAQUE IOHEXOL 350 MG/ML SOLN COMPARISON:  Oct 31, 2014 FINDINGS: CTA CHEST FINDINGS Cardiovascular: Satisfactory opacification of the pulmonary arteries to the segmental level. No evidence of pulmonary embolism. Mildly enlarged heart size. No pericardial effusion. Mediastinum/Nodes: Homogeneously enlarged thyroid gland. There are multiple borderline enlarged  mediastinal lymph nodes. RIGHT paratracheal lymph node measures 11 mm in the short axis (series 507, image 55). No axillary adenopathy or hilar adenopathy. Lungs/Pleura: No pleural effusion or pneumothorax. Mild bronchial wall thickening with scattered areas of endobronchial debris. Bibasilar atelectasis. Musculoskeletal: No aggressive osseous abnormality. Review of the MIP images confirms the above findings. CT  ABDOMEN and PELVIS FINDINGS Hepatobiliary: There are multiple heterogeneously enhancing masses throughout the liver. Representative mass within hepatic segment 5/6 spans approximately 8.6 cm (series 11, image 33). Representative heterogeneously enhancing mass spanning hepatic segments 6/7 with associated capsular retraction spans at least 11.8 cm (series 14, image 70). Gallbladder is unremarkable. Portal vein is patent. Pancreas: Unremarkable. No pancreatic ductal dilatation or surrounding inflammatory changes. Spleen: Splenomegaly. Adrenals/Urinary Tract: Adrenal glands are unremarkable. Kidneys are normal, without renal calculi, focal lesion, or hydronephrosis. Bladder is unremarkable. Stomach/Bowel: Stomach is within normal limits. Appendix appears normal. No evidence of bowel wall thickening, distention, or inflammatory changes. Vascular/Lymphatic: Abdominal aorta is normal in course and caliber. Mildly prominent porta hepatic lymph node measures 15 mm in the short axis (series 11, image 28). Prominent LEFT periaortic lymph node measures 9 mm in the short axis (series 11, image 38). Reproductive: Coarse calcifications of the prostate. Other: No free air or free fluid. Musculoskeletal: No aggressive osseous lesions. Limbus vertebra of L5. Review of the MIP images confirms the above findings. IMPRESSION: 1. No acute pulmonary embolism. 2. There are multiple heterogeneously enhancing masses throughout the liver, several which demonstrate capsular retraction. No definitive extrahepatic primary is identified.  Findings could reflect multifocal hepatocellular carcinoma/cholangiocarcinoma, lymphoma or abscess. Recommend correlation with blood chemistry values. Liver MRI with and without contrast could be of assistance in further characterizing these masses. 3. Splenomegaly. 4. Multiple borderline enlarged lymph nodes above and below the diaphragm. 5. Homogeneously enlarged thyroid gland, nonspecific. Consider further dedicated evaluation with nonemergent thyroid ultrasound. 6. Mild bronchial wall thickening with bibasilar atelectasis. This could reflect a nonspecific bronchitis. These results were called by telephone at the time of interpretation on 03/22/2022 at 4:39 pm to provider Dr. Archie Balboa, who verbally acknowledged these results. Electronically Signed   By: Valentino Saxon M.D.   On: 03/22/2022 16:47   CT Head Wo Contrast  Result Date: 03/22/2022 CLINICAL DATA:  Dizziness, persistent/recurrent, cardiac or vascular cause suspected EXAM: CT HEAD WITHOUT CONTRAST TECHNIQUE: Contiguous axial images were obtained from the base of the skull through the vertex without intravenous contrast. RADIATION DOSE REDUCTION: This exam was performed according to the departmental dose-optimization program which includes automated exposure control, adjustment of the mA and/or kV according to patient size and/or use of iterative reconstruction technique. COMPARISON:  Nov 17, 2004 FINDINGS: Brain: No evidence of acute infarction, hemorrhage, hydrocephalus, extra-axial collection or mass lesion/mass effect. Periventricular white matter hypodensities consistent with sequela of chronic microvascular ischemic disease. Vascular: No hyperdense vessel or unexpected calcification. Skull: No acute fracture. 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 Sinuses/Orbits: No acute finding. Other: None. IMPRESSION: No acute intracranial abnormality. Electronically Signed   By: Valentino Saxon M.D.   On: 03/22/2022 16:14    DG Chest 1 View  Result Date: 03/22/2022 CLINICAL DATA:  Dizziness and right-sided pain at night. Intermittent fevers and unexpected weight loss. EXAM: CHEST  1 VIEW COMPARISON:  Chest radiograph 12/29/2010, 12/13/2007 FINDINGS: Borderline enlargement of the cardiac silhouette, which could exaggerated secondary to portable technique. Subtle interstitial thickening in the lower lungs. No pleural effusion or pneumothorax. Visualized osseous structures are unremarkable. IMPRESSION: Subtle interstitial thickening in the lower lungs may represent subsegmental atelectasis or possibly mild pulmonary edema. No lobar consolidation or pleural effusion. Electronically Signed   By: Ileana Roup M.D.   On: 03/22/2022 13:51

## 2022-04-21 NOTE — Assessment & Plan Note (Signed)
Discussed with patient

## 2022-04-21 NOTE — Assessment & Plan Note (Signed)
Malignancy related, encourge oral hydration.

## 2022-04-21 NOTE — Assessment & Plan Note (Signed)
oxycodone PRN as directed

## 2022-04-21 NOTE — Assessment & Plan Note (Signed)
Treatment plan as listed above. 

## 2022-04-22 ENCOUNTER — Ambulatory Visit: Payer: 59 | Admitting: Oncology

## 2022-04-22 ENCOUNTER — Other Ambulatory Visit: Payer: 59

## 2022-04-22 ENCOUNTER — Inpatient Hospital Stay: Payer: 59 | Attending: Oncology

## 2022-04-22 VITALS — BP 111/76 | HR 78 | Temp 98.2°F | Resp 18

## 2022-04-22 DIAGNOSIS — C8339 Diffuse large B-cell lymphoma, extranodal and solid organ sites: Secondary | ICD-10-CM | POA: Insufficient documentation

## 2022-04-22 DIAGNOSIS — Z809 Family history of malignant neoplasm, unspecified: Secondary | ICD-10-CM | POA: Insufficient documentation

## 2022-04-22 DIAGNOSIS — Z5111 Encounter for antineoplastic chemotherapy: Secondary | ICD-10-CM | POA: Insufficient documentation

## 2022-04-22 DIAGNOSIS — C8338 Diffuse large B-cell lymphoma, lymph nodes of multiple sites: Secondary | ICD-10-CM

## 2022-04-22 DIAGNOSIS — Z79899 Other long term (current) drug therapy: Secondary | ICD-10-CM | POA: Insufficient documentation

## 2022-04-22 DIAGNOSIS — G893 Neoplasm related pain (acute) (chronic): Secondary | ICD-10-CM | POA: Diagnosis not present

## 2022-04-22 DIAGNOSIS — R634 Abnormal weight loss: Secondary | ICD-10-CM | POA: Insufficient documentation

## 2022-04-22 DIAGNOSIS — Z5189 Encounter for other specified aftercare: Secondary | ICD-10-CM | POA: Diagnosis not present

## 2022-04-22 MED ORDER — PALONOSETRON HCL INJECTION 0.25 MG/5ML
0.2500 mg | Freq: Once | INTRAVENOUS | Status: AC
  Administered 2022-04-22: 0.25 mg via INTRAVENOUS
  Filled 2022-04-22: qty 5

## 2022-04-22 MED ORDER — METHYLPREDNISOLONE SODIUM SUCC 125 MG IJ SOLR
125.0000 mg | Freq: Once | INTRAMUSCULAR | Status: AC | PRN
  Administered 2022-04-22: 125 mg via INTRAVENOUS

## 2022-04-22 MED ORDER — SODIUM CHLORIDE 0.9 % IV SOLN
1.8000 mg/kg | Freq: Once | INTRAVENOUS | Status: AC
  Administered 2022-04-22: 140 mg via INTRAVENOUS
  Filled 2022-04-22: qty 7

## 2022-04-22 MED ORDER — DIPHENHYDRAMINE HCL 50 MG/ML IJ SOLN
50.0000 mg | Freq: Once | INTRAMUSCULAR | Status: AC | PRN
  Administered 2022-04-22: 25 mg via INTRAVENOUS

## 2022-04-22 MED ORDER — SODIUM CHLORIDE 0.9 % IV SOLN
150.0000 mg | Freq: Once | INTRAVENOUS | Status: AC
  Administered 2022-04-22: 150 mg via INTRAVENOUS
  Filled 2022-04-22: qty 150

## 2022-04-22 MED ORDER — DOXORUBICIN HCL CHEMO IV INJECTION 2 MG/ML
50.0000 mg/m2 | Freq: Once | INTRAVENOUS | Status: AC
  Administered 2022-04-22: 94 mg via INTRAVENOUS
  Filled 2022-04-22: qty 47

## 2022-04-22 MED ORDER — SODIUM CHLORIDE 0.9 % IV SOLN
750.0000 mg/m2 | Freq: Once | INTRAVENOUS | Status: AC
  Administered 2022-04-22: 1400 mg via INTRAVENOUS
  Filled 2022-04-22: qty 50

## 2022-04-22 MED ORDER — DIPHENHYDRAMINE HCL 25 MG PO CAPS
50.0000 mg | ORAL_CAPSULE | Freq: Once | ORAL | Status: AC
  Administered 2022-04-22: 50 mg via ORAL
  Filled 2022-04-22: qty 2

## 2022-04-22 MED ORDER — ACETAMINOPHEN 325 MG PO TABS
650.0000 mg | ORAL_TABLET | Freq: Once | ORAL | Status: AC
  Administered 2022-04-22: 650 mg via ORAL
  Filled 2022-04-22: qty 2

## 2022-04-22 MED ORDER — HEPARIN SOD (PORK) LOCK FLUSH 100 UNIT/ML IV SOLN
INTRAVENOUS | Status: AC
  Filled 2022-04-22: qty 5

## 2022-04-22 MED ORDER — SODIUM CHLORIDE 0.9 % IV SOLN
Freq: Once | INTRAVENOUS | Status: DC | PRN
  Filled 2022-04-22: qty 250

## 2022-04-22 MED ORDER — SODIUM CHLORIDE 0.9 % IV SOLN
15.0000 mg | Freq: Once | INTRAVENOUS | Status: AC
  Administered 2022-04-22: 15 mg via INTRAVENOUS
  Filled 2022-04-22: qty 1.5

## 2022-04-22 MED ORDER — DIPHENHYDRAMINE HCL 50 MG/ML IJ SOLN
25.0000 mg | Freq: Once | INTRAMUSCULAR | Status: AC
  Administered 2022-04-22: 25 mg via INTRAVENOUS

## 2022-04-22 MED ORDER — SODIUM CHLORIDE 0.9 % IV SOLN
Freq: Once | INTRAVENOUS | Status: AC
  Filled 2022-04-22: qty 250

## 2022-04-22 MED ORDER — FAMOTIDINE IN NACL 20-0.9 MG/50ML-% IV SOLN
20.0000 mg | Freq: Once | INTRAVENOUS | Status: AC | PRN
  Administered 2022-04-22: 20 mg via INTRAVENOUS

## 2022-04-22 MED ORDER — HEPARIN SOD (PORK) LOCK FLUSH 100 UNIT/ML IV SOLN
500.0000 [IU] | Freq: Once | INTRAVENOUS | Status: DC | PRN
  Filled 2022-04-22: qty 5

## 2022-04-22 MED ORDER — SODIUM CHLORIDE 0.9 % IV SOLN
375.0000 mg/m2 | Freq: Once | INTRAVENOUS | Status: AC
  Administered 2022-04-22: 700 mg via INTRAVENOUS
  Filled 2022-04-22: qty 20

## 2022-04-22 NOTE — Patient Instructions (Signed)
[facility] [place]  Discharge Instructions: Thank you for choosing [facility] to provide your oncology and hematology care.  If you have a lab appointment with the [facility], please go directly to the [facility] and check in at the registration area.  Wear comfortable clothing and clothing appropriate for easy access to any Portacath or PICC line.   We strive to give you quality time with your provider. You may need to reschedule your appointment if you arrive late (15 or more minutes).  Arriving late affects you and other patients whose appointments are after yours.  Also, if you miss three or more appointments without notifying the office, you may be dismissed from the clinic at the provider's discretion.      For prescription refill requests, have your pharmacy contact our office and allow 72 hours for refills to be completed.    Today you received the following chemotherapy and/or immunotherapy agents Polivy, Adriamycin, Cytoxan, Truxima      To help prevent nausea and vomiting after your treatment, we encourage you to take your nausea medication as directed.  BELOW ARE SYMPTOMS THAT SHOULD BE REPORTED IMMEDIATELY: *FEVER GREATER THAN 100.4 F (38 C) OR HIGHER *CHILLS OR SWEATING *NAUSEA AND VOMITING THAT IS NOT CONTROLLED WITH YOUR NAUSEA MEDICATION *UNUSUAL SHORTNESS OF BREATH *UNUSUAL BRUISING OR BLEEDING *URINARY PROBLEMS (pain or burning when urinating, or frequent urination) *BOWEL PROBLEMS (unusual diarrhea, constipation, pain near the anus) TENDERNESS IN MOUTH AND THROAT WITH OR WITHOUT PRESENCE OF ULCERS (sore throat, sores in mouth, or a toothache) UNUSUAL RASH, SWELLING OR PAIN  UNUSUAL VAGINAL DISCHARGE OR ITCHING   Items with * indicate a potential emergency and should be followed up as soon as possible or go to the Emergency Department if any problems should occur.  Please show the CHEMOTHERAPY ALERT CARD or IMMUNOTHERAPY  ALERT CARD at check-in to the Emergency Department and triage nurse.  Should you have questions after your visit or need to cancel or reschedule your appointment, please contact [facility] [place] [facility] [place]  [phone] and follow the prompts.  Office hours are 8:00 a.m. to 4:30 p.m. Monday - Friday. Please note that voicemails left after 4:00 p.m. may not be returned until the following business day.  We are closed weekends and major holidays. You have access to a nurse at all times for urgent questions. Please call the main number to the clinic [phone] and follow the prompts.  For any non-urgent questions, you may also contact your provider using MyChart. We now offer e-Visits for anyone 61 and older to request care online for non-urgent symptoms. For details visit [email or web address].   Also download the MyChart app! Go to the app store, search "MyChart", open the app, select [facility], and log in with your MyChart username and password.  Masks are optional in the cancer centers. If you would like for your care team to wear a mask while they are taking care of you, please let them know. For doctor visits, patients may have with them one support person who is at least 61 years old. At this time, visitors are not allowed in the infusion area.   Polatuzumab Vedotin Injection What is this medication? POLATUZUMAB VEDOTIN (poe la tooz ue mab ve doe tin) treats lymphoma. It works by blocking a protein that causes cancer cells to grow and multiply. This helps to slow or stop the spread of cancer cells. This medicine may be used for other purposes;  ask your health care provider or pharmacist if you have questions. COMMON BRAND NAME(S): Denyse Dago What should I tell my care team before I take this medication? They need to know if you have any of these conditions: Infection, such as chickenpox, cold sores, herpes Liver disease Low blood cell levels, such as white cells, red cells,  platelets Tingling of the fingers or toes or other nerve disorder An unusual or allergic reaction to polatuzumab vedotin, other medications, foods, dyes, or preservatives If you or your partner are pregnant or trying to get pregnant Breast-feeding How should I use this medication? This medication is infused into a vein. It is given by your care team in a hospital or clinic setting. Talk to your care team about the use of this medication in children. Special care may be needed. Overdosage: If you think you have taken too much of this medicine contact a poison control center or emergency room at once. NOTE: This medicine is only for you. Do not share this medicine with others. What if I miss a dose? Keep appointments for follow-up doses. It is important not to miss your dose. Call your care team if you are unable to keep an appointment. What may interact with this medication? Certain antibiotics, such as clarithromycin, telithromycin Certain antivirals for HIV or AIDS Certain medications for fungal infections, such as ketoconazole, itraconazole, posaconazole, voriconazole Certain medications for seizures, such as carbamazepine, phenobarbital, phenytoin This list may not describe all possible interactions. Give your health care provider a list of all the medicines, herbs, non-prescription drugs, or dietary supplements you use. Also tell them if you smoke, drink alcohol, or use illegal drugs. Some items may interact with your medicine. What should I watch for while using this medication? Your condition will be monitored carefully while you are receiving this medication. This medication may make you feel generally unwell. This is not uncommon as chemotherapy can affect healthy cells as well as cancer cells. Report any side effects. Continue your course of treatment even though you feel ill unless your care team tells you to stop. You may need blood work while taking this medication. This medication  may increase your risk of getting an infection. Call your care team for advice if you get a fever, chills, sore throat, or other symptoms of a cold or flu. Do not treat yourself. Try to avoid being around people who are sick. This medication may increase your risk to bruise or bleed. Call your care team if you notice any unusual bleeding. In some patients, this medication may cause a serious brain infection that may cause death. If you have any problems seeing, thinking, speaking, walking, or standing, tell your care team right away. If you cannot reach your care team, urgently seek other source of medical care. Talk to your care team if you wish to become pregnant or think you might be pregnant. This medication can cause serious birth defects if taken during pregnancy or for 3 months after the last dose.  A negative pregnancy test is required before starting this medication. A reliable form of contraception is recommended while taking this medication and for 3 months after the last dose. Talk to your care team about effective forms of contraception. Do not father a child while taking this medication or for 5 months after the last dose. Use a condom while having sex during this time period. Do not breastfeed while taking this medication and for 2 months after the last dose. This medication may cause  infertility. Talk to your care team if you are concerned about your fertility. What side effects may I notice from receiving this medication? Side effects that you should report to your care team as soon as possible: Allergic reactions--skin rash, itching, hives, swelling of the face, lips, tongue, or throat Dizziness, loss of balance or coordination, confusion or trouble speaking Infection--fever, chills, cough, sore throat, wounds that don't heal, pain or trouble when passing urine, general feeling of discomfort or being unwell Infusion reactions--chest pain, shortness of breath or trouble breathing, feeling  faint or lightheaded Liver injury--right upper belly pain, loss of appetite, nausea, light-colored stool, dark yellow or brown urine, yellowing skin or eyes, unusual weakness or fatigue Low red blood cell level--unusual weakness or fatigue, dizziness, headache, trouble breathing Pain, tingling, or numbness in the hands or feet Stomach pain, unusual weakness or fatigue, nausea, vomiting, diarrhea, or fever that lasts longer than expected Tumor lysis syndrome (TLS)--nausea, vomiting, diarrhea, decrease in the amount of urine, dark urine, unusual weakness or fatigue, confusion, muscle pain or cramps, fast or irregular heartbeat, joint pain Unusual bruising or bleeding Side effects that usually do not require medical attention (report these to your care team if they continue or are bothersome): Diarrhea Dizziness Loss of appetite Vomiting Weight loss This list may not describe all possible side effects. Call your doctor for medical advice about side effects. You may report side effects to FDA at 1-800-FDA-1088. Where should I keep my medication? This medication is given in a hospital or clinic. It will not be stored at home. NOTE: This sheet is a summary. It may not cover all possible information. If you have questions about this medicine, talk to your doctor, pharmacist, or health care provider.  2023 Elsevier/Gold Standard (2021-10-15 00:00:00)   Doxorubicin Injection What is this medication? DOXORUBICIN (dox oh ROO bi sin) treats some types of cancer. It works by slowing down the growth of cancer cells. This medicine may be used for other purposes; ask your health care provider or pharmacist if you have questions. COMMON BRAND NAME(S): Adriamycin, Adriamycin PFS, Adriamycin RDF, Rubex What should I tell my care team before I take this medication? They need to know if you have any of these conditions: Heart disease History of low blood cell levels caused by a medication Liver  disease Recent or ongoing radiation An unusual or allergic reaction to doxorubicin, other medications, foods, dyes, or preservatives If you or your partner are pregnant or trying to get pregnant Breast-feeding How should I use this medication? This medication is injected into a vein. It is given by your care team in a hospital or clinic setting. Talk to your care team about the use of this medication in children. Special care may be needed. Overdosage: If you think you have taken too much of this medicine contact a poison control center or emergency room at once. NOTE: This medicine is only for you. Do not share this medicine with others. What if I miss a dose? Keep appointments for follow-up doses. It is important not to miss your dose. Call your care team if you are unable to keep an appointment. What may interact with this medication? 6-mercaptopurine Paclitaxel Phenytoin [facility] wort Trastuzumab Verapamil This list may not describe all possible interactions. Give your health care provider a list of all the medicines, herbs, non-prescription drugs, or dietary supplements you use. Also tell them if you smoke, drink alcohol, or use illegal drugs. Some items may interact with your medicine. What  should I watch for while using this medication? Your condition will be monitored carefully while you are receiving this medication. You may need blood work while taking this medication. This medication may make you feel generally unwell. This is not uncommon as chemotherapy can affect healthy cells as well as cancer cells. Report any side effects. Continue your course of treatment even though you feel ill unless your care team tells you to stop. There is a maximum amount of this medication you should receive throughout your life. The amount depends on the medical condition being treated and your overall health. Your care team will watch how much of this medication you receive. Tell your care team if  you have taken this medication before. Your urine may turn red for a few days after your dose. This is not blood. If your urine is dark or brown, call your care team. In some cases, you may be given additional medications to help with side effects. Follow all directions for their use. This medication may increase your risk of getting an infection. Call your care team for advice if you get a fever, chills, sore throat, or other symptoms of a cold or flu. Do not treat yourself. Try to avoid being around people who are sick. This medication may increase your risk to bruise or bleed. Call your care team if you notice any unusual bleeding. Talk to your care team about your risk of cancer. You may be more at risk for certain types of cancers if you take this medication. You should make sure that you get enough Coenzyme Q10 while you are taking this medication. Discuss the foods you eat and the vitamins you take with your care team. Talk to your care team if you or your partner may be pregnant. Serious birth defects can occur if you take this medication during pregnancy and for 6 months after the last dose. Contraception is recommended while taking this medication and for 6 months after the last dose. Your care team can help you find the option that works for you. If your partner can get pregnant, use a condom while taking this medication and for 6 months after the last dose. Do not breastfeed while taking this medication. This medication may cause infertility. Talk to your care team if you are concerned about your fertility. What side effects may I notice from receiving this medication? Side effects that you should report to your care team as soon as possible: Allergic reactions--skin rash, itching, hives, swelling of the face, lips, tongue, or throat Heart failure--shortness of breath, swelling of the ankles, feet, or hands, sudden weight gain, unusual weakness or fatigue Heart rhythm changes--fast or  irregular heartbeat, dizziness, feeling faint or lightheaded, chest pain, trouble breathing Infection--fever, chills, cough, sore throat, wounds that don't heal, pain or trouble when passing urine, general feeling of discomfort or being unwell Low red blood cell level--unusual weakness or fatigue, dizziness, headache, trouble breathing Painful swelling, warmth, or redness of the skin, blisters or sores at the infusion site Unusual bruising or bleeding Side effects that usually do not require medical attention (report to your care team if they continue or are bothersome): Diarrhea Hair loss Nausea Pain, redness, or swelling with sores inside the mouth or throat Red urine This list may not describe all possible side effects. Call your doctor for medical advice about side effects. You may report side effects to FDA at 1-800-FDA-1088. Where should I keep my medication? This medication is given in a hospital  or clinic. It will not be stored at home. NOTE: This sheet is a summary. It may not cover all possible information. If you have questions about this medicine, talk to your doctor, pharmacist, or health care provider.  2023 Elsevier/Gold Standard (2021-10-15 00:00:00)  Cyclophosphamide Injection What is this medication? CYCLOPHOSPHAMIDE (sye kloe FOSS fa mide) treats some types of cancer. It works by slowing down the growth of cancer cells. This medicine may be used for other purposes; ask your health care provider or pharmacist if you have questions. COMMON BRAND NAME(S): Cyclophosphamide, Cytoxan, Neosar What should I tell my care team before I take this medication? They need to know if you have any of these conditions: Heart disease Irregular heartbeat or rhythm Infection Kidney problems Liver disease Low blood cell levels (white cells, platelets, or red blood cells) Lung disease Previous radiation Trouble passing urine An unusual or allergic reaction to cyclophosphamide, other  medications, foods, dyes, or preservatives Pregnant or trying to get pregnant Breast-feeding How should I use this medication? This medication is injected into a vein. It is given by your care team in a hospital or clinic setting. Talk to your care team about the use of this medication in children. Special care may be needed. Overdosage: If you think you have taken too much of this medicine contact a poison control center or emergency room at once. NOTE: This medicine is only for you. Do not share this medicine with others. What if I miss a dose? Keep appointments for follow-up doses. It is important not to miss your dose. Call your care team if you are unable to keep an appointment. What may interact with this medication? Amphotericin B Amiodarone Azathioprine Certain antivirals for HIV or hepatitis Certain medications for blood pressure, such as enalapril, lisinopril, quinapril Cyclosporine Diuretics Etanercept Indomethacin Medications that relax muscles Metronidazole Natalizumab Tamoxifen Warfarin This list may not describe all possible interactions. Give your health care provider a list of all the medicines, herbs, non-prescription drugs, or dietary supplements you use. Also tell them if you smoke, drink alcohol, or use illegal drugs. Some items may interact with your medicine. What should I watch for while using this medication? This medication may make you feel generally unwell. This is not uncommon as chemotherapy can affect healthy cells as well as cancer cells. Report any side effects. Continue your course of treatment even though you feel ill unless your care team tells you to stop. You may need blood work while you are taking this medication. This medication may increase your risk of getting an infection. Call your care team for advice if you get a fever, chills, sore throat, or other symptoms of a cold or flu. Do not treat yourself. Try to avoid being around people who are  sick. Avoid taking medications that contain aspirin, acetaminophen, ibuprofen, naproxen, or ketoprofen unless instructed by your care team. These medications may hide a fever. Be careful brushing or flossing your teeth or using a toothpick because you may get an infection or bleed more easily. If you have any dental work done, tell your dentist you are receiving this medication. Drink water or other fluids as directed. Urinate often, even at night. Some products may contain alcohol. Ask your care team if this medication contains alcohol. Be sure to tell all care teams you are taking this medicine. Certain medicines, like metronidazole and disulfiram, can cause an unpleasant reaction when taken with alcohol. The reaction includes flushing, headache, nausea, vomiting, sweating, and increased thirst. The  reaction can last from 30 minutes to several hours. Talk to your care team if you wish to become pregnant or think you might be pregnant. This medication can cause serious birth defects if taken during pregnancy and for 1 year after the last dose. A negative pregnancy test is required before starting this medication. A reliable form of contraception is recommended while taking this medication and for 1 year after the last dose. Talk to your care team about reliable forms of contraception. Do not father a child while taking this medication and for 4 months after the last dose. Use a condom during this time period. Do not breast-feed while taking this medication or for 1 week after the last dose. This medication may cause infertility. Talk to your care team if you are concerned about your fertility. Talk to your care team about your risk of cancer. You may be more at risk for certain types of cancer if you take this medication. What side effects may I notice from receiving this medication? Side effects that you should report to your care team as soon as possible: Allergic reactions--skin rash, itching, hives,  swelling of the face, lips, tongue, or throat Dry cough, shortness of breath or trouble breathing Heart failure--shortness of breath, swelling of the ankles, feet, or hands, sudden weight gain, unusual weakness or fatigue Heart muscle inflammation--unusual weakness or fatigue, shortness of breath, chest pain, fast or irregular heartbeat, dizziness, swelling of the ankles, feet, or hands Heart rhythm changes--fast or irregular heartbeat, dizziness, feeling faint or lightheaded, chest pain, trouble breathing Infection--fever, chills, cough, sore throat, wounds that don't heal, pain or trouble when passing urine, general feeling of discomfort or being unwell Kidney injury--decrease in the amount of urine, swelling of the ankles, hands, or feet Liver injury--right upper belly pain, loss of appetite, nausea, light-colored stool, dark yellow or brown urine, yellowing skin or eyes, unusual weakness or fatigue Low red blood cell level--unusual weakness or fatigue, dizziness, headache, trouble breathing Low sodium level--muscle weakness, fatigue, dizziness, headache, confusion Red or dark brown urine Unusual bruising or bleeding Side effects that usually do not require medical attention (report to your care team if they continue or are bothersome): Hair loss Irregular menstrual cycles or spotting Loss of appetite Nausea Pain, redness, or swelling with sores inside the mouth or throat Vomiting This list may not describe all possible side effects. Call your doctor for medical advice about side effects. You may report side effects to FDA at 1-800-FDA-1088. Where should I keep my medication? This medication is given in a hospital or clinic. It will not be stored at home. NOTE: This sheet is a summary. It may not cover all possible information. If you have questions about this medicine, talk to your doctor, pharmacist, or health care provider.  2023 Elsevier/Gold Standard (2021-07-29 00:00:00)  Rituximab  Injection What is this medication? RITUXIMAB (ri TUX i mab) treats leukemia and lymphoma. It works by blocking a protein that causes cancer cells to grow and multiply. This helps to slow or stop the spread of cancer cells. It may also be used to treat autoimmune conditions, such as arthritis. It works by slowing down an overactive immune system. It is a monoclonal antibody. This medicine may be used for other purposes; ask your health care provider or pharmacist if you have questions. COMMON BRAND NAME(S): RIABNI, Rituxan, RUXIENCE, truxima What should I tell my care team before I take this medication? They need to know if you have any of these  conditions: Chest pain Heart disease Immune system problems Infection, such as chickenpox, cold sores, hepatitis B, herpes Irregular heartbeat or rhythm Kidney disease Low blood counts, such as low white cells, platelets, red cells Lung disease Recent or upcoming vaccine An unusual or allergic reaction to rituximab, other medications, foods, dyes, or preservatives Pregnant or trying to get pregnant Breast-feeding How should I use this medication? This medication is injected into a vein. It is given by a care team in a hospital or clinic setting. A special MedGuide will be given to you before each treatment. Be sure to read this information carefully each time. Talk to your care team about the use of this medication in children. While this medication may be prescribed for children as young as 6 months for selected conditions, precautions do apply. Overdosage: If you think you have taken too much of this medicine contact a poison control center or emergency room at once. NOTE: This medicine is only for you. Do not share this medicine with others. What if I miss a dose? Keep appointments for follow-up doses. It is important not to miss your dose. Call your care team if you are unable to keep an appointment. What may interact with this medication? Do  not take this medication with any of the following: Live vaccines This medication may also interact with the following: Cisplatin This list may not describe all possible interactions. Give your health care provider a list of all the medicines, herbs, non-prescription drugs, or dietary supplements you use. Also tell them if you smoke, drink alcohol, or use illegal drugs. Some items may interact with your medicine. What should I watch for while using this medication? Your condition will be monitored carefully while you are receiving this medication. You may need blood work while taking this medication. This medication can cause serious infusion reactions. To reduce the risk your care team may give you other medications to take before receiving this one. Be sure to follow the directions from your care team. This medication may increase your risk of getting an infection. Call your care team for advice if you get a fever, chills, sore throat, or other symptoms of a cold or flu. Do not treat yourself. Try to avoid being around people who are sick. Call your care team if you are around anyone with measles, chickenpox, or if you develop sores or blisters that do not heal properly. Avoid taking medications that contain aspirin, acetaminophen, ibuprofen, naproxen, or ketoprofen unless instructed by your care team. These medications may hide a fever. This medication may cause serious skin reactions. They can happen weeks to months after starting the medication. Contact your care team right away if you notice fevers or flu-like symptoms with a rash. The rash may be red or purple and then turn into blisters or peeling of the skin. You may also notice a red rash with swelling of the face, lips, or lymph nodes in your neck or under your arms. In some patients, this medication may cause a serious brain infection that may cause death. If you have any problems seeing, thinking, speaking, walking, or standing, tell your  care team right away. If you cannot reach your care team, urgently seek another source of medical care. Talk to your care team if you may be pregnant. Serious birth defects can occur if you take this medication during pregnancy and for 12 months after the last dose. You will need a negative pregnancy test before starting this medication. Contraception is recommended  while taking this medication and for 12 months after the last dose. Your care team can help you find the option that works for you. Do not breastfeed while taking this medication and for at least 6 months after the last dose. What side effects may I notice from receiving this medication? Side effects that you should report to your care team as soon as possible: Allergic reactions or angioedema--skin rash, itching or hives, swelling of the face, eyes, lips, tongue, arms, or legs, trouble swallowing or breathing Bowel blockage--stomach cramping, unable to have a bowel movement or pass gas, loss of appetite, vomiting Dizziness, loss of balance or coordination, confusion or trouble speaking Heart attack--pain or tightness in the chest, shoulders, arms, or jaw, nausea, shortness of breath, cold or clammy skin, feeling faint or lightheaded Heart rhythm changes--fast or irregular heartbeat, dizziness, feeling faint or lightheaded, chest pain, trouble breathing Infection--fever, chills, cough, sore throat, wounds that don't heal, pain or trouble when passing urine, general feeling of discomfort or being unwell Infusion reactions--chest pain, shortness of breath or trouble breathing, feeling faint or lightheaded Kidney injury--decrease in the amount of urine, swelling of the ankles, hands, or feet Liver injury--right upper belly pain, loss of appetite, nausea, light-colored stool, dark yellow or brown urine, yellowing skin or eyes, unusual weakness or fatigue Redness, blistering, peeling, or loosening of the skin, including inside the mouth Stomach  pain that is severe, does not go away, or gets worse Tumor lysis syndrome (TLS)--nausea, vomiting, diarrhea, decrease in the amount of urine, dark urine, unusual weakness or fatigue, confusion, muscle pain or cramps, fast or irregular heartbeat, joint pain Side effects that usually do not require medical attention (report to your care team if they continue or are bothersome): Headache Joint pain Nausea Runny or stuffy nose Unusual weakness or fatigue This list may not describe all possible side effects. Call your doctor for medical advice about side effects. You may report side effects to FDA at 1-800-FDA-1088. Where should I keep my medication? This medication is given in a hospital or clinic. It will not be stored at home. NOTE: This sheet is a summary. It may not cover all possible information. If you have questions about this medicine, talk to your doctor, pharmacist, or health care provider.  2023 [employer] Standard (2021-10-21 00:00:00)

## 2022-04-22 NOTE — Progress Notes (Signed)
1556: Pt stops eating his peanut butter cracker and Pt reports pressure behind eyes and "It feels like my mouth is bleeding".  Rituximab paused and NS started to gravity.  Nelwyn Salisbury PA notified and at chairside.  No bleeding noted in patient's mouth.  **see MAR for medications given** 1602: Pt reports eye pressure has improved.  1605: Pt reports all symptoms resolved.  Per Nelwyn Salisbury PA wait 10 minutes and restart Rituximab at half rate.   **see flowsheet for VS**  1615: Pt continues to deny symptoms and VS stable. Rituximab restarted at '100mg'$ /hr (45.7 ml/hr). 1620: Per Dr. Tasia Catchings give additional 25 mg IV Benadryl.  Rituximab paused and 25 mg IV Benadryl given per order.  1623: Rituximab restarted.  1655: Report given to [facility]. Pt remains stable.

## 2022-04-22 NOTE — Progress Notes (Signed)
Rituxan continued and titrated per protocol with no further incident. Patient tolerated the remainder of his treatment well with no complaints. Discharged to home in stable, ambulatory condition. Patient's brother picked him up.

## 2022-04-23 ENCOUNTER — Ambulatory Visit: Payer: 59

## 2022-04-23 ENCOUNTER — Telehealth: Payer: Self-pay

## 2022-04-23 NOTE — Telephone Encounter (Signed)
Telephone call to patient for follow up after receiving first infusion.   Patient states infusion went great.  States eating good and drinking plenty of fluids.   Denies any nausea or vomiting.  Encouraged patient to call for any concerns or questions. 

## 2022-04-24 ENCOUNTER — Other Ambulatory Visit: Payer: Self-pay

## 2022-04-24 ENCOUNTER — Encounter: Payer: Self-pay | Admitting: Oncology

## 2022-04-24 ENCOUNTER — Other Ambulatory Visit: Payer: Self-pay | Admitting: Oncology

## 2022-04-24 ENCOUNTER — Telehealth: Payer: Self-pay

## 2022-04-24 ENCOUNTER — Inpatient Hospital Stay: Payer: 59

## 2022-04-24 DIAGNOSIS — C8338 Diffuse large B-cell lymphoma, lymph nodes of multiple sites: Secondary | ICD-10-CM

## 2022-04-24 DIAGNOSIS — Z5111 Encounter for antineoplastic chemotherapy: Secondary | ICD-10-CM | POA: Diagnosis not present

## 2022-04-24 MED ORDER — PEGFILGRASTIM INJECTION 6 MG/0.6ML ~~LOC~~
6.0000 mg | PREFILLED_SYRINGE | Freq: Once | SUBCUTANEOUS | Status: AC
  Administered 2022-04-24: 6 mg via SUBCUTANEOUS
  Filled 2022-04-24: qty 0.6

## 2022-04-24 NOTE — Telephone Encounter (Signed)
Request for Intrathecal MTX appt has been faxed to IR.

## 2022-04-28 ENCOUNTER — Encounter: Payer: Self-pay | Admitting: Licensed Clinical Social Worker

## 2022-04-28 ENCOUNTER — Inpatient Hospital Stay: Payer: 59

## 2022-04-28 ENCOUNTER — Encounter: Payer: Self-pay | Admitting: Oncology

## 2022-04-28 ENCOUNTER — Inpatient Hospital Stay (HOSPITAL_BASED_OUTPATIENT_CLINIC_OR_DEPARTMENT_OTHER): Payer: 59 | Admitting: Oncology

## 2022-04-28 VITALS — BP 149/94 | HR 112 | Temp 98.6°F | Resp 18 | Wt 169.9 lb

## 2022-04-28 DIAGNOSIS — C8338 Diffuse large B-cell lymphoma, lymph nodes of multiple sites: Secondary | ICD-10-CM | POA: Diagnosis not present

## 2022-04-28 DIAGNOSIS — Z95828 Presence of other vascular implants and grafts: Secondary | ICD-10-CM

## 2022-04-28 DIAGNOSIS — G893 Neoplasm related pain (acute) (chronic): Secondary | ICD-10-CM

## 2022-04-28 DIAGNOSIS — R634 Abnormal weight loss: Secondary | ICD-10-CM

## 2022-04-28 DIAGNOSIS — C8580 Other specified types of non-Hodgkin lymphoma, unspecified site: Secondary | ICD-10-CM

## 2022-04-28 DIAGNOSIS — Z5111 Encounter for antineoplastic chemotherapy: Secondary | ICD-10-CM | POA: Diagnosis not present

## 2022-04-28 DIAGNOSIS — C8330 Diffuse large B-cell lymphoma, unspecified site: Secondary | ICD-10-CM

## 2022-04-28 LAB — COMPREHENSIVE METABOLIC PANEL
ALT: 127 U/L — ABNORMAL HIGH (ref 0–44)
AST: 69 U/L — ABNORMAL HIGH (ref 15–41)
Albumin: 3.6 g/dL (ref 3.5–5.0)
Alkaline Phosphatase: 195 U/L — ABNORMAL HIGH (ref 38–126)
Anion gap: 11 (ref 5–15)
BUN: 27 mg/dL — ABNORMAL HIGH (ref 8–23)
CO2: 27 mmol/L (ref 22–32)
Calcium: 9.4 mg/dL (ref 8.9–10.3)
Chloride: 96 mmol/L — ABNORMAL LOW (ref 98–111)
Creatinine, Ser: 0.57 mg/dL — ABNORMAL LOW (ref 0.61–1.24)
GFR, Estimated: >60 mL/min (ref >60)
Glucose, Bld: 157 mg/dL — ABNORMAL HIGH (ref 70–99)
Potassium: 4.1 mmol/L (ref 3.5–5.1)
Sodium: 134 mmol/L — ABNORMAL LOW (ref 135–145)
Total Bilirubin: 0.8 mg/dL (ref 0.3–1.2)
Total Protein: 6.7 g/dL (ref 6.5–8.1)

## 2022-04-28 LAB — CBC WITH DIFFERENTIAL/PLATELET
Abs Immature Granulocytes: 0 10*3/uL (ref 0.00–0.07)
Basophils Absolute: 0 10*3/uL (ref 0.0–0.1)
Basophils Relative: 0 %
Eosinophils Absolute: 0 10*3/uL (ref 0.0–0.5)
Eosinophils Relative: 0 %
HCT: 36 % — ABNORMAL LOW (ref 39.0–52.0)
Hemoglobin: 11.9 g/dL — ABNORMAL LOW (ref 13.0–17.0)
Lymphocytes Relative: 15 %
Lymphs Abs: 0.8 10*3/uL (ref 0.7–4.0)
MCH: 27.2 pg (ref 26.0–34.0)
MCHC: 33.1 g/dL (ref 30.0–36.0)
MCV: 82.2 fL (ref 80.0–100.0)
Monocytes Absolute: 0.1 10*3/uL (ref 0.1–1.0)
Monocytes Relative: 2 %
Neutro Abs: 4.4 10*3/uL (ref 1.7–7.7)
Neutrophils Relative %: 83 %
Platelets: 200 10*3/uL (ref 150–400)
RBC: 4.38 MIL/uL (ref 4.22–5.81)
RDW: 14.8 % (ref 11.5–15.5)
Smear Review: NORMAL
WBC: 5.3 10*3/uL (ref 4.0–10.5)
nRBC: 0 % (ref 0.0–0.2)

## 2022-04-28 MED ORDER — SODIUM CHLORIDE 0.9% FLUSH
10.0000 mL | Freq: Once | INTRAVENOUS | Status: AC
  Administered 2022-04-28: 10 mL via INTRAVENOUS
  Filled 2022-04-28: qty 10

## 2022-04-28 MED ORDER — HEPARIN SOD (PORK) LOCK FLUSH 100 UNIT/ML IV SOLN
500.0000 [IU] | Freq: Once | INTRAVENOUS | Status: AC
  Administered 2022-04-28: 500 [IU]
  Filled 2022-04-28: qty 5

## 2022-04-28 NOTE — Assessment & Plan Note (Addendum)
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  PET scan was reviewed and discussed with patient.- liver, spleen, possible  thyroid,  involvements.  Marrow is negative on PET. Obtain bone marrow biopsy.  IPI score is 4.  CNS IPI - consider adding IT MTX along with Cycle 2 treatment. S/p Cycle 1 R-Pola -CHP with GCSF, tolerated well.  continue allopurinol and acyclovir. Labs are reviewed and discussed with patient. Keep currently schedule cycle 2 appt. We discussed about adding Intra thecal MTX. He agrees with the plan.

## 2022-04-28 NOTE — Progress Notes (Signed)
Pt here for follow up. NO new concerns voiced 

## 2022-04-28 NOTE — Telephone Encounter (Signed)
Pt scheduled for Intrathecal MTX on 11/30 and informed of appt

## 2022-04-28 NOTE — Progress Notes (Signed)
Hematology/Oncology Progress note [phone] [phone]            Patient Care Team: Ranae Plumber, [place] as PCP - General (Family Medicine) Ranae Plumber, [place] (Family Medicine) Christene Lye, MD (General Surgery) Clent Jacks, RN as Oncology Nurse Navigator  ASSESSMENT & PLAN:   Cancer Staging  Diffuse large B cell lymphoma [facility]) Staging form: Hodgkin and Non-Hodgkin Lymphoma, AJCC 8th Edition - Clinical stage from 03/31/2022: Stage IV (Diffuse large B-cell lymphoma) - Signed by [name], MD on 04/19/2022   Diffuse large B cell lymphoma ([place]) BCL6 cmyc IHC positive, FISH negative for BCL2, BCL6, and CMYC rearrangement.  PET scan was reviewed and discussed with patient.- liver, spleen, possible  thyroid,  involvements.  Marrow is negative on PET. Obtain bone marrow biopsy.  IPI score is 4.  CNS IPI - consider adding IT MTX along with Cycle 2 treatment. S/p Cycle 1 R-Pola -CHP with GCSF, tolerated well.  continue allopurinol and acyclovir. Labs are reviewed and discussed with patient. Keep currently schedule cycle 2 appt. We discussed about adding Intra thecal MTX. He agrees with the plan.   Neoplasm related pain oxycodone PRN as directed  Hypercalcemia Malignancy related, encourge oral hydration.   Unintentional weight loss Due to lymphoma. He has gained weight.  Encourage nutritional supplementation.     Orders Placed This Encounter  Procedures   Uric acid    Standing Status:   Future    Number of Occurrences:   1    Standing Expiration Date:   04/29/2023   Follow up  2 weeks lab MD R- pola CHP IT MTX  D3 udenyca  All questions were answered. The patient knows to call the clinic with any problems, questions or concerns.  [name], MD, PhD [facility] Hematology Oncology 04/28/2022    CHIEF COMPLAINTS/REASON FOR VISIT:  Evaluation of liver mass  HISTORY OF PRESENTING ILLNESS:   Joe Adams is a  61 y.o.  male with  PMH listed below was seen in consultation at the request of  Ranae Plumber, [place]  for evaluation of liver mass.  03/22/2022, patient presented to emergency room for evaluation of right side lower chest/upper abdomen/flank pain, intermittent low-grade fever and chills, night sweats and unintentional weight loss of 20 to 30 pounds over the past 6 months.  He finished a course of antibiotics a few months ago for unknown infection and symptom has not improved.  Oncology History  Diffuse large B cell lymphoma ([place])  03/22/2022 Imaging   CT chest angiogram, CT abdomen pelvis w contrast  1. No acute pulmonary embolism. 2. There are multiple heterogeneously enhancing masses throughout the liver, several which demonstrate capsular retraction. No definitive extrahepatic primary is identified. Findings could reflect multifocal hepatocellular carcinoma/cholangiocarcinoma, lymphoma or abscess. Recommend correlation with blood chemistry values. Liver MRI with and without contrast could be of assistance in further characterizing these masses. 3. Splenomegaly.4. Multiple borderline enlarged lymph nodes above and below the diaphragm. 5. Homogeneously enlarged thyroid gland, nonspecific. Consider further dedicated evaluation with nonemergent thyroid ultrasound. 6. Mild bronchial wall thickening with bibasilar atelectasis. This could reflect a nonspecific bronchitis.   03/22/2022 Imaging   MRI liver w wo contrast 1. Bilobar hepatic lesions demonstrate irregular contour with minimally increased T2 signal, hypointense intrinsic T1 signal, postcontrast enhancement that is slowly increases and is most prominent in intensities 5 minutes postcontrast administration as well as a peripheral rim of reduced diffusivity.  2. Enlarged upper abdominal lymph nodes, likely  reflect metastatic nodal disease involvement. 3. Enhancing lesions in the right tenth rib and L2 vertebral body,concerning for osseous metastatic disease. 4.  Prominent subcentimeter pericardiophrenic and internal mammary lymph nodes also suspicious for nodal metastatic disease.    03/31/2022 Cancer Staging   Staging form: Hodgkin and Non-Hodgkin Lymphoma, AJCC 8th Edition - Clinical stage from 03/31/2022: Stage IV (Diffuse large B-cell lymphoma) - Signed by [name], MD on 04/19/2022 Stage prefix: Initial diagnosis International Prognostic Index (IPI) score: Score 4 International Prognostic Index risk group: High risk DLBCL cell of origin (COO): Activated B-cell   04/03/2022 Initial Diagnosis   Large cell lymphoma ([place])  04/03/2022, ultrasound-guided liver biopsy showed large B-cell lymphoma.  Additional IHC is pending for subclassification as well as ancillary FISH testing for rearrangementds   04/16/2022 Imaging   PET initial staging showed 1. Multiple bulky intensely hypermetabolic liver masses throughout both liver lobes, compatible with reported lymphoma. Deauville score 5. 2. Mild splenomegaly with mild splenic hypermetabolism, compatible with lymphoma. No enlarged or hypermetabolic lymph nodes. No hypermetabolic skeletal activity. 3. 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 4. Chronic findings include: Aortic Atherosclerosis.Mild sigmoid diverticulosis.     04/20/2022 Procedure   Medi port placed by IR   04/22/2022 -  Chemotherapy   Patient is on Treatment Plan : [place] CHP q21d       INTERVAL HISTORY [name] is a 61 y.o. male who has above history reviewed by me today presents for follow up visit for large B-cell lymphoma.   Status post cycle 1 R- Pola CHP treatments.  Overall he tolerates well.  Mild nausea, home antiemetics medication helped.  He denies any pain today.  No nausea vomiting diarrhea currently.  His appetite is good and he is eating and drinking. He has gained a few pounds since last visit.  MEDICAL HISTORY:  Past Medical  History:  Diagnosis Date   Acute MI ([place]) 2005   Diabetes mellitus without complication (HCC)    GERD (gastroesophageal reflux disease)    Headache    Hernia, umbilical    History of kidney stones    Large cell lymphoma ([place]) 04/03/2022   Thyroid disease     SURGICAL HISTORY: Past Surgical History:  Procedure Laterality Date   COLONOSCOPY N/A 11/18/2016   Procedure: COLONOSCOPY IN O.R;  Surgeon: Christene Lye, MD;  Location: [facility] ORS;  Service: General;  Laterality: N/A;   COLONOSCOPY WITH PROPOFOL N/A 11/18/2016   Procedure: COLONOSCOPY WITH PROPOFOL;  Surgeon: Christene Lye, MD;  Location: [facility] ENDOSCOPY;  Service: Endoscopy;  Laterality: N/A;   HEMORRHOID SURGERY N/A 11/18/2016   Procedure: HEMORRHOIDECTOMY;  Surgeon: Christene Lye, MD;  Location: [facility] ORS;  Service: General;  Laterality: N/A;   IR IMAGING GUIDED PORT INSERTION  04/20/2022   TONSILLECTOMY     as a child    SOCIAL HISTORY: Social History   Socioeconomic History   Marital status: Legally Separated    Spouse name: Not on file   Number of children: Not on file   Years of education: Not on file   Highest education level: Not on file  Occupational History   Not on file  Tobacco Use   Smoking status: Never   Smokeless tobacco: Never  Vaping Use   Vaping Use: Never used  Substance and Sexual Activity   Alcohol use: Not Currently    Alcohol/week: 1.0 standard drink of alcohol    Types: 1 Shots of  liquor per week    Comment: occ   Drug use: No   Sexual activity: Not on file  Other Topics Concern   Not on file  Social History Narrative   Not on file   Social Determinants of Health   Financial Resource Strain: Low Risk  (04/15/2022)   Overall Financial Resource Strain (CARDIA)    Difficulty of Paying Living Expenses: Not very hard  Recent Concern: Financial Resource Strain - Medium Risk (04/15/2022)   Overall Financial Resource Strain (CARDIA)    Difficulty of Paying Living  Expenses: Somewhat hard  Food Insecurity: No Food Insecurity (04/15/2022)   Hunger Vital Sign    Worried About Running Out of Food in the Last Year: Never true    Ran Out of Food in the Last Year: Never true  Transportation Needs: No Transportation Needs (04/15/2022)   PRAPARE - Hydrologist (Medical): No    Lack of Transportation (Non-Medical): No  Physical Activity: Inactive (04/15/2022)   Exercise Vital Sign    Days of Exercise per Week: 0 days    Minutes of Exercise per Session: 0 min  Stress: Stress Concern Present (04/15/2022)   [place]    Feeling of Stress : To some extent  Social Connections: Moderately Integrated (04/15/2022)   Social Connection and Isolation Panel [NHANES]    Frequency of Communication with Friends and Family: Three times a week    Frequency of Social Gatherings with Friends and Family: Twice a week    Attends Religious Services: 1 to 4 times per year    Active Member of [employer] or Organizations: No    Attends Archivist Meetings: Never    Marital Status: Living with partner  Intimate Partner Violence: Not At Risk (04/15/2022)   Humiliation, Afraid, Rape, and Kick questionnaire    Fear of Current or Ex-Partner: No    Emotionally Abused: No    Physically Abused: No    Sexually Abused: No    FAMILY HISTORY: Family History  Problem Relation Age of Onset   Cancer Other    Cancer Paternal Uncle     ALLERGIES:  has No Known Allergies.  MEDICATIONS:  Current Outpatient Medications  Medication Sig Dispense Refill   acyclovir (ZOVIRAX) 400 MG tablet Take 1 tablet (400 mg total) by mouth daily. 30 tablet 3   allopurinol (ZYLOPRIM) 100 MG tablet Take 1 tablet (100 mg total) by mouth daily. 30 tablet 1   Docusate Calcium (STOOL SOFTENER PO) Take by mouth.     lactulose (CHRONULAC) 10 GM/15ML solution Take 15 mLs (10 g total) by mouth daily as needed  for mild constipation. 236 mL 0   levothyroxine (SYNTHROID) 150 MCG tablet Take 150 mcg by mouth daily.     lidocaine-prilocaine (EMLA) cream Apply to affected area once 30 g 3   metFORMIN (GLUCOPHAGE) 1000 MG tablet 2 (two) times daily.     oxyCODONE-acetaminophen (PERCOCET) 5-325 MG tablet Take 1 tablet by mouth every 6 (six) hours as needed for severe pain. 15 tablet 0   predniSONE (DELTASONE) 20 MG tablet Take 5 tablets (100 mg total) by mouth daily. Take with food on days 2-5 of chemotherapy. 20 tablet 5   ondansetron (ZOFRAN) 8 MG tablet Take 1 tablet (8 mg total) by mouth every 8 (eight) hours as needed for nausea or vomiting. Start on the third day after cyclophosphamide chemotherapy. (Patient not taking: Reported on 04/28/2022) 30 tablet  1   prochlorperazine (COMPAZINE) 10 MG tablet Take 1 tablet (10 mg total) by mouth every 6 (six) hours as needed for nausea or vomiting. (Patient not taking: Reported on 04/28/2022) 30 tablet 6   No current facility-administered medications for this visit.    Review of Systems  Constitutional:  Positive for fatigue. Negative for appetite change, chills, fever and unexpected weight change.  HENT:   Negative for hearing loss and voice change.   Eyes:  Negative for eye problems and icterus.  Respiratory:  Negative for chest tightness, cough and shortness of breath.   Cardiovascular:  Negative for chest pain and leg swelling.  Gastrointestinal:  Negative for abdominal distention and abdominal pain.  Endocrine: Negative for hot flashes.  Genitourinary:  Negative for difficulty urinating, dysuria and frequency.   Musculoskeletal:  Negative for arthralgias.  Skin:  Negative for itching and rash.  Neurological:  Negative for light-headedness and numbness.  Hematological:  Negative for adenopathy. Does not bruise/bleed easily.  Psychiatric/Behavioral:  Negative for confusion.    unable PHYSICAL EXAMINATION: ECOG PERFORMANCE STATUS: 1 - Symptomatic but  completely ambulatory Vitals:   04/28/22 0859  BP: (!) 149/94  Pulse: (!) 112  Resp: 18  Temp: 98.6 F (37 C)   Filed Weights   04/28/22 0859  Weight: 169 lb 14.4 oz (77.1 kg)    Physical Exam Constitutional:      General: He is not in acute distress. HENT:     Head: Normocephalic and atraumatic.  Eyes:     General: No scleral icterus. Cardiovascular:     Rate and Rhythm: Normal rate.  Pulmonary:     Effort: Pulmonary effort is normal. No respiratory distress.     Breath sounds: No wheezing.  Abdominal:     General: There is no distension.  Musculoskeletal:        General: No deformity. Normal range of motion.     Cervical back: Normal range of motion and neck supple.  Skin:    General: Skin is warm.     Findings: No erythema or rash.  Neurological:     Mental Status: He is alert and oriented to person, place, and time. Mental status is at baseline.     Cranial Nerves: No cranial nerve deficit.     Coordination: Coordination normal.  Psychiatric:        Mood and Affect: Mood normal.     LABORATORY DATA:  I have reviewed the data as listed    Latest Ref Rng & Units 04/28/2022    8:33 AM 04/21/2022    8:48 AM 03/31/2022    7:57 AM  CBC  WBC 4.0 - 10.5 K/uL 5.3  8.0  10.4   Hemoglobin 13.0 - 17.0 g/dL 11.9  13.1  12.7   Hematocrit 39.0 - 52.0 % 36.0  39.9  38.3   Platelets 150 - 400 K/uL 200  308  353       Latest Ref Rng & Units 04/28/2022    8:33 AM 04/21/2022    8:48 AM 03/24/2022   10:19 AM  CMP  Glucose 70 - 99 mg/dL 157  133    BUN 8 - 23 mg/dL 27  28    Creatinine 0.61 - 1.24 mg/dL 0.57  0.78    Sodium 135 - 145 mmol/L 134  135    Potassium 3.5 - 5.1 mmol/L 4.1  4.5    Chloride 98 - 111 mmol/L 96  102    CO2 22 - 32  mmol/L 27  20    Calcium 8.9 - 10.3 mg/dL 9.4  10.5    Total Protein 6.5 - 8.1 g/dL 6.7  7.8  7.9   Total Bilirubin 0.3 - 1.2 mg/dL 0.8  0.5  0.8   Alkaline Phos 38 - 126 U/L 195  260  218   AST 15 - 41 U/L 69  46  24   ALT 0 - 44  U/L 127  25  18       RADIOGRAPHIC STUDIES: I have personally reviewed the radiological images as listed and agreed with the findings in the report. US BIOPSY (LIVER)  Result Date: 03/31/2022 INDICATION: 61 year old male with multifocal liver masses of indeterminate etiology. EXAM: ULTRASOUND GUIDED LIVER LESION BIOPSY COMPARISON:  None Available. MEDICATIONS: None ANESTHESIA/SEDATION: Fentanyl 50 mcg IV; Versed 1 mg IV Total Moderate Sedation time:  10 minutes. The patient's level of consciousness and vital signs were monitored continuously by radiology nursing throughout the procedure under my direct supervision. COMPLICATIONS: None immediate. PROCEDURE: Informed written consent was obtained from the patient after a discussion of the risks, benefits and alternatives to treatment. The patient understands and consents the procedure. A timeout was performed prior to the initiation of the procedure. Ultrasound scanning was performed of the right upper abdominal quadrant demonstrates multifocal heterogeneously solid masses throughout the liver. A subcapsular right lobe mass was selected for biopsy and the procedure was planned. The right upper abdominal quadrant was prepped and draped in the usual sterile fashion. The overlying soft tissues were anesthetized with 1% lidocaine with epinephrine. A 17 gauge, 6.8 cm co-axial needle was advanced into a peripheral aspect of the lesion. This was followed by 4 core biopsies with an 18 gauge core device under direct ultrasound guidance. The coaxial needle tract was embolized with a small amount of Gel-Foam slurry and superficial hemostasis was obtained with manual compression. Post procedural scanning was negative for definitive area of hemorrhage or additional complication. A dressing was placed. The patient tolerated the procedure well without immediate post procedural complication. IMPRESSION: Technically successful ultrasound guided core needle biopsy of right  lobe liver mass. Ruthann Cancer, MD [facility] Radiology Electronically Signed   By: Ruthann Cancer M.D.   On: 03/31/2022 10:49   MR LIVER W WO CONTRAST  Result Date: 03/22/2022 CLINICAL DATA:  Further evaluation of hepatic lesions seen on prior CT. EXAM: MRI ABDOMEN WITHOUT AND WITH CONTRAST TECHNIQUE: Multiplanar multisequence MR imaging of the abdomen was performed both before and after the administration of intravenous contrast. CONTRAST:  7cc of Vue[place] COMPARISON:  Multiple priors including most recent CT chest abdomen pelvis dated March 22, 2022. FINDINGS: Lower chest: No acute abnormality. Hepatobiliary: Bilobar hepatic lesions demonstrate irregular contour with minimally increased T2 signal and hypointense intrinsic T1 signal demonstrating postcontrast enhancement which slowly increases over progressive postcontrast pulse sequences most dominant on the 5 minute delayed and with a peripheral rim of reduced diffusivity. For reference: -lesion in the central right lobe of the liver measures 9.2 x 7.2 cm on image 49/13 -segment IVa/VIII hepatic lesion measures 7.9 x 5.4 cm on image 31/13 -segment II hepatic lesion measures 3.4 x 2.7 cm on image 21/13. No evidence of portal venous involvement. Gallbladder is unremarkable.  No biliary ductal dilation. Pancreas: No pancreatic ductal dilation or evidence of acute inflammation. No suspicious pancreatic mass identified. Spleen:  No splenomegaly or focal splenic lesion. Adrenals/Urinary Tract: Bilateral adrenal glands are within normal limits. No hydronephrosis. No solid  enhancing renal mass. Stomach/Bowel: Visualized portions within the abdomen are unremarkable. Vascular/Lymphatic: Normal caliber abdominal aorta. The portal, splenic and superior mesenteric veins are patent. Enlarged retroperitoneal, portacaval, periportal, and gastrohepatic ligament lymph nodes. For reference: -portacaval lymph node measures 13 mm in  short axis on image 49/16. -periportal lymph node measures 10 mm in short axis on image 44/16. Prominent subcentimeter pericardiophrenic and internal mammary lymph nodes. For reference: -A lymph node along the inferior aspect of the internal mammary artery on the right below the xiphoid process measures 8 mm in short axis on image 18/22 -A pericardiophrenic lymph node measures 7 mm in short axis on image 88/22. Other:  No significant abdominopelvic free fluid. Musculoskeletal: Enhancing lesion in the right tenth rib on image 38/22. Enhancing lesion in the L2 vertebral body on image 56/22. IMPRESSION: 1. Bilobar hepatic lesions demonstrate irregular contour with minimally increased T2 signal, hypointense intrinsic T1 signal, postcontrast enhancement that is slowly increases and is most prominent in intensities 5 minutes postcontrast administration as well as a peripheral rim of reduced diffusivity. 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 With alternate differential consideration of atypical hepatocellular carcinoma. Recommend oncology referral with multidisciplinary tumor board consultation and definitive diagnosis with direct tissue sampling. 2. Enlarged upper abdominal lymph nodes, likely reflect metastatic nodal disease involvement. 3. Enhancing lesions in the right tenth rib and L2 vertebral body, concerning for osseous metastatic disease. 4. Prominent subcentimeter pericardiophrenic and internal mammary lymph nodes also suspicious for nodal metastatic disease. Electronically Signed   By: Dahlia Bailiff M.D.   On: 03/22/2022 19:52   CT Angio Chest PE W/Cm &/Or Wo Cm  Result Date: 03/22/2022 CLINICAL DATA:  Abdominal pain, acute, nonlocalized; Pulmonary embolism (PE) suspected, high prob EXAM: CT ANGIOGRAPHY CHEST CT ABDOMEN AND PELVIS WITH CONTRAST TECHNIQUE: Multidetector CT imaging of the chest was performed using the standard protocol during bolus administration of  intravenous contrast. Multiplanar CT image reconstructions and MIPs were obtained to evaluate the vascular anatomy. Multidetector CT imaging of the abdomen and pelvis was performed using the standard protocol during bolus administration of intravenous contrast. body onc RADIATION DOSE REDUCTION: This exam was performed according to the departmental dose-optimization program which includes automated exposure control, adjustment of the mA and/or kV according to patient size and/or use of iterative reconstruction technique. CONTRAST:  76m OMNIPAQUE IOHEXOL 350 MG/ML SOLN COMPARISON:  Oct 31, 2014 FINDINGS: CTA CHEST FINDINGS Cardiovascular: Satisfactory opacification of the pulmonary arteries to the segmental level. No evidence of pulmonary embolism. Mildly enlarged heart size. No pericardial effusion. Mediastinum/Nodes: Homogeneously enlarged thyroid gland. There are multiple borderline enlarged mediastinal lymph nodes. RIGHT paratracheal lymph node measures 11 mm in the short axis (series 507, image 55). No axillary adenopathy or hilar adenopathy. Lungs/Pleura: No pleural effusion or pneumothorax. Mild bronchial wall thickening with scattered areas of endobronchial debris. Bibasilar atelectasis. Musculoskeletal: No aggressive osseous abnormality. Review of the MIP images confirms the above findings. CT ABDOMEN and PELVIS FINDINGS Hepatobiliary: There are multiple heterogeneously enhancing masses throughout the liver. Representative mass within hepatic segment 5/6 spans approximately 8.6 cm (series 11, image 33). Representative heterogeneously enhancing mass spanning hepatic segments 6/7 with associated capsular retraction spans at least 11.8 cm (series 14, image 70). Gallbladder is unremarkable. Portal vein is patent. Pancreas: Unremarkable. No pancreatic ductal dilatation or surrounding inflammatory changes. Spleen: Splenomegaly. Adrenals/Urinary Tract: Adrenal glands are unremarkable. Kidneys are normal, without  renal calculi, focal lesion, or hydronephrosis. Bladder is unremarkable. Stomach/Bowel: Stomach is within normal limits. Appendix  appears normal. No evidence of bowel wall thickening, distention, or inflammatory changes. Vascular/Lymphatic: Abdominal aorta is normal in course and caliber. Mildly prominent porta hepatic lymph node measures 15 mm in the short axis (series 11, image 28). Prominent LEFT periaortic lymph node measures 9 mm in the short axis (series 11, image 38). Reproductive: Coarse calcifications of the prostate. Other: No free air or free fluid. Musculoskeletal: No aggressive osseous lesions. Limbus vertebra of L5. Review of the MIP images confirms the above findings. IMPRESSION: 1. No acute pulmonary embolism. 2. There are multiple heterogeneously enhancing masses throughout the liver, several which demonstrate capsular retraction. No definitive extrahepatic primary is identified. Findings could reflect multifocal hepatocellular carcinoma/cholangiocarcinoma, lymphoma or abscess. Recommend correlation with blood chemistry values. Liver MRI with and without contrast could be of assistance in further characterizing these masses. 3. Splenomegaly. 4. Multiple borderline enlarged lymph nodes above and below the diaphragm. 5. Homogeneously enlarged thyroid gland, nonspecific. Consider further dedicated evaluation with nonemergent thyroid ultrasound. 6. Mild bronchial wall thickening with bibasilar atelectasis. This could reflect a nonspecific bronchitis. These results were called by telephone at the time of interpretation on 03/22/2022 at 4:39 pm to provider Dr. Archie Balboa, who verbally acknowledged these results. Electronically Signed   By: Valentino Saxon M.D.   On: 03/22/2022 16:47   CT Abdomen Pelvis W Contrast  Result Date: 03/22/2022 CLINICAL DATA:  Abdominal pain, acute, nonlocalized; Pulmonary embolism (PE) suspected, high prob EXAM: CT ANGIOGRAPHY CHEST CT ABDOMEN AND PELVIS WITH CONTRAST  TECHNIQUE: Multidetector CT imaging of the chest was performed using the standard protocol during bolus administration of intravenous contrast. Multiplanar CT image reconstructions and MIPs were obtained to evaluate the vascular anatomy. Multidetector CT imaging of the abdomen and pelvis was performed using the standard protocol during bolus administration of intravenous contrast. body onc RADIATION DOSE REDUCTION: This exam was performed according to the departmental dose-optimization program which includes automated exposure control, adjustment of the mA and/or kV according to patient size and/or use of iterative reconstruction technique. CONTRAST:  68m OMNIPAQUE IOHEXOL 350 MG/ML SOLN COMPARISON:  Oct 31, 2014 FINDINGS: CTA CHEST FINDINGS Cardiovascular: Satisfactory opacification of the pulmonary arteries to the segmental level. No evidence of pulmonary embolism. Mildly enlarged heart size. No pericardial effusion. Mediastinum/Nodes: Homogeneously enlarged thyroid gland. There are multiple borderline enlarged mediastinal lymph nodes. RIGHT paratracheal lymph node measures 11 mm in the short axis (series 507, image 55). No axillary adenopathy or hilar adenopathy. Lungs/Pleura: No pleural effusion or pneumothorax. Mild bronchial wall thickening with scattered areas of endobronchial debris. Bibasilar atelectasis. Musculoskeletal: No aggressive osseous abnormality. Review of the MIP images confirms the above findings. CT ABDOMEN and PELVIS FINDINGS Hepatobiliary: There are multiple heterogeneously enhancing masses throughout the liver. Representative mass within hepatic segment 5/6 spans approximately 8.6 cm (series 11, image 33). Representative heterogeneously enhancing mass spanning hepatic segments 6/7 with associated capsular retraction spans at least 11.8 cm (series 14, image 70). Gallbladder is unremarkable. Portal vein is patent. Pancreas: Unremarkable. No pancreatic ductal dilatation or surrounding  inflammatory changes. Spleen: Splenomegaly. Adrenals/Urinary Tract: Adrenal glands are unremarkable. Kidneys are normal, without renal calculi, focal lesion, or hydronephrosis. Bladder is unremarkable. Stomach/Bowel: Stomach is within normal limits. Appendix appears normal. No evidence of bowel wall thickening, distention, or inflammatory changes. Vascular/Lymphatic: Abdominal aorta is normal in course and caliber. Mildly prominent porta hepatic lymph node measures 15 mm in the short axis (series 11, image 28). Prominent LEFT periaortic lymph node measures 9 mm in the short axis (series 11, image  38). Reproductive: Coarse calcifications of the prostate. Other: No free air or free fluid. Musculoskeletal: No aggressive osseous lesions. Limbus vertebra of L5. Review of the MIP images confirms the above findings. IMPRESSION: 1. No acute pulmonary embolism. 2. There are multiple heterogeneously enhancing masses throughout the liver, several which demonstrate capsular retraction. No definitive extrahepatic primary is identified. Findings could reflect multifocal hepatocellular carcinoma/cholangiocarcinoma, lymphoma or abscess. Recommend correlation with blood chemistry values. Liver MRI with and without contrast could be of assistance in further characterizing these masses. 3. Splenomegaly. 4. Multiple borderline enlarged lymph nodes above and below the diaphragm. 5. Homogeneously enlarged thyroid gland, nonspecific. Consider further dedicated evaluation with nonemergent thyroid ultrasound. 6. Mild bronchial wall thickening with bibasilar atelectasis. This could reflect a nonspecific bronchitis. These results were called by telephone at the time of interpretation on 03/22/2022 at 4:39 pm to provider Dr. Archie Balboa, who verbally acknowledged these results. Electronically Signed   By: Valentino Saxon M.D.   On: 03/22/2022 16:47   CT Head Wo Contrast  Result Date: 03/22/2022 CLINICAL DATA:  Dizziness,  persistent/recurrent, cardiac or vascular cause suspected EXAM: CT HEAD WITHOUT CONTRAST TECHNIQUE: Contiguous axial images were obtained from the base of the skull through the vertex without intravenous contrast. RADIATION DOSE REDUCTION: This exam was performed according to the departmental dose-optimization program which includes automated exposure control, adjustment of the mA and/or kV according to patient size and/or use of iterative reconstruction technique. COMPARISON:  Nov 17, 2004 FINDINGS: Brain: No evidence of acute infarction, hemorrhage, hydrocephalus, extra-axial collection or mass lesion/mass effect. Periventricular white matter hypodensities consistent with sequela of chronic microvascular ischemic disease. Vascular: No hyperdense vessel or unexpected calcification. Skull: No acute fracture. 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 Sinuses/Orbits: No acute finding. Other: None. IMPRESSION: No acute intracranial abnormality. Electronically Signed   By: Valentino Saxon M.D.   On: 03/22/2022 16:14   DG Chest 1 View  Result Date: 03/22/2022 CLINICAL DATA:  Dizziness and right-sided pain at night. Intermittent fevers and unexpected weight loss. EXAM: CHEST  1 VIEW COMPARISON:  Chest radiograph 12/29/2010, 12/13/2007 FINDINGS: Borderline enlargement of the cardiac silhouette, which could exaggerated secondary to portable technique. Subtle interstitial thickening in the lower lungs. No pleural effusion or pneumothorax. Visualized osseous structures are unremarkable. IMPRESSION: Subtle interstitial thickening in the lower lungs may represent subsegmental atelectasis or possibly mild pulmonary edema. No lobar consolidation or pleural effusion. Electronically Signed   By: Ileana Roup M.D.   On: 03/22/2022 13:51

## 2022-04-28 NOTE — Assessment & Plan Note (Signed)
Due to lymphoma. He has gained weight.  Encourage nutritional supplementation.

## 2022-04-28 NOTE — Assessment & Plan Note (Signed)
Malignancy related, encourge oral hydration.

## 2022-04-28 NOTE — Assessment & Plan Note (Signed)
oxycodone PRN as directed

## 2022-04-28 NOTE — Progress Notes (Signed)
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  Past medical history of MI, DM, GERD, thyroid disease.  Patient receiving R-POLA CHP.    Met with patient and brother after MD visit.  Patient reports that appetite has increased recently. Brother and his wife are helping patient with meals and trying to monitor sugary foods due to DM.  Patient denies nausea, some constipation.  Has been eating 4-5 small frequent meals during the day.      Medications: colace, zofran, compazine, predinisone, metformin   Labs: Na 134, glucose 157, BUN 27, creatinine 0.57   Anthropometrics:   Height: 66 inches Weight: 169 lb 14.4 oz today 20-30 lb weight loss in the last 6 months BMI: 26  11-15% weight loss in the last 6 months   Estimated Energy Needs  Kcals: 4268-3419 Protein: 95-114 g Fluid: 1900-2280 ml   NUTRITION DIAGNOSIS: Unintentional weight loss related to cancer as evidenced by 11-15% weight loss over the last 6 months, weight increasing at this time   INTERVENTION:  Discussed importance of good nutrition and weight maintenance during treatment Encouraged good sources of protein at every meal/snack.  Examples provided Family to continue to assist with meals Contact information provided   MONITORING, EVALUATION, GOAL: weight trends, intake   Next Visit: Tuesday, Nov 28  Adanya Sosinski B. Zenia Resides, [place], [place] Registered Dietitian [phone]

## 2022-04-28 NOTE — Progress Notes (Signed)
[place] CSW Progress Note  Clinical Education officer, museum contacted patient by phone to discuss financial assistance needs. Confirmed with patient he has applied for available assistance and has appointment with disability office on 11/28. CSW update patient's care team.    Adelene Amas, LCSW

## 2022-04-29 ENCOUNTER — Emergency Department: Payer: 59

## 2022-04-29 ENCOUNTER — Other Ambulatory Visit: Payer: Self-pay

## 2022-04-29 ENCOUNTER — Telehealth: Payer: Self-pay | Admitting: *Deleted

## 2022-04-29 ENCOUNTER — Emergency Department
Admission: EM | Admit: 2022-04-29 | Discharge: 2022-04-29 | Disposition: A | Discharge status: Home / Self Care | Payer: 59 | Attending: Emergency Medicine | Admitting: Emergency Medicine

## 2022-04-29 ENCOUNTER — Encounter: Payer: Self-pay | Admitting: Emergency Medicine

## 2022-04-29 DIAGNOSIS — C787 Secondary malignant neoplasm of liver and intrahepatic bile duct: Secondary | ICD-10-CM | POA: Diagnosis not present

## 2022-04-29 DIAGNOSIS — E1165 Type 2 diabetes mellitus with hyperglycemia: Secondary | ICD-10-CM | POA: Insufficient documentation

## 2022-04-29 DIAGNOSIS — C8580 Other specified types of non-Hodgkin lymphoma, unspecified site: Secondary | ICD-10-CM | POA: Diagnosis not present

## 2022-04-29 DIAGNOSIS — M549 Dorsalgia, unspecified: Secondary | ICD-10-CM | POA: Insufficient documentation

## 2022-04-29 DIAGNOSIS — C7889 Secondary malignant neoplasm of other digestive organs: Secondary | ICD-10-CM | POA: Insufficient documentation

## 2022-04-29 DIAGNOSIS — M79606 Pain in leg, unspecified: Secondary | ICD-10-CM | POA: Diagnosis present

## 2022-04-29 LAB — COMPREHENSIVE METABOLIC PANEL
ALT: 137 U/L — ABNORMAL HIGH (ref 0–44)
AST: 79 U/L — ABNORMAL HIGH (ref 15–41)
Albumin: 3.7 g/dL (ref 3.5–5.0)
Alkaline Phosphatase: 238 U/L — ABNORMAL HIGH (ref 38–126)
Anion gap: 10 (ref 5–15)
BUN: 25 mg/dL — ABNORMAL HIGH (ref 8–23)
CO2: 24 mmol/L (ref 22–32)
Calcium: 9.6 mg/dL (ref 8.9–10.3)
Chloride: 96 mmol/L — ABNORMAL LOW (ref 98–111)
Creatinine, Ser: 0.74 mg/dL (ref 0.61–1.24)
GFR, Estimated: >60 mL/min (ref >60)
Glucose, Bld: 231 mg/dL — ABNORMAL HIGH (ref 70–99)
Potassium: 4.4 mmol/L (ref 3.5–5.1)
Sodium: 130 mmol/L — ABNORMAL LOW (ref 135–145)
Total Bilirubin: 1 mg/dL (ref 0.3–1.2)
Total Protein: 6.8 g/dL (ref 6.5–8.1)

## 2022-04-29 LAB — CBC WITH DIFFERENTIAL/PLATELET
Abs Immature Granulocytes: 0.06 10*3/uL (ref 0.00–0.07)
Basophils Absolute: 0 10*3/uL (ref 0.0–0.1)
Basophils Relative: 1 %
Eosinophils Absolute: 0 10*3/uL (ref 0.0–0.5)
Eosinophils Relative: 3 %
HCT: 35.3 % — ABNORMAL LOW (ref 39.0–52.0)
Hemoglobin: 11.6 g/dL — ABNORMAL LOW (ref 13.0–17.0)
Immature Granulocytes: 7 %
Lymphocytes Relative: 56 %
Lymphs Abs: 0.5 10*3/uL — ABNORMAL LOW (ref 0.7–4.0)
MCH: 27.2 pg (ref 26.0–34.0)
MCHC: 32.9 g/dL (ref 30.0–36.0)
MCV: 82.7 fL (ref 80.0–100.0)
Monocytes Absolute: 0.2 10*3/uL (ref 0.1–1.0)
Monocytes Relative: 17 %
Neutro Abs: 0.2 10*3/uL — CL (ref 1.7–7.7)
Neutrophils Relative %: 16 %
Platelets: 153 10*3/uL (ref 150–400)
RBC: 4.27 MIL/uL (ref 4.22–5.81)
RDW: 14.8 % (ref 11.5–15.5)
Smear Review: NORMAL
WBC: 0.9 10*3/uL — CL (ref 4.0–10.5)
nRBC: 0 % (ref 0.0–0.2)

## 2022-04-29 LAB — URINALYSIS, ROUTINE W REFLEX MICROSCOPIC
Bilirubin Urine: NEGATIVE
Glucose, UA: 50 mg/dL — AB
Hgb urine dipstick: NEGATIVE
Ketones, ur: NEGATIVE mg/dL
Leukocytes,Ua: NEGATIVE
Nitrite: NEGATIVE
Protein, ur: NEGATIVE mg/dL
Specific Gravity, Urine: 1.004 — ABNORMAL LOW (ref 1.005–1.030)
pH: 7 (ref 5.0–8.0)

## 2022-04-29 LAB — CBG MONITORING, ED
Glucose-Capillary: 221 mg/dL — ABNORMAL HIGH (ref 70–99)
Glucose-Capillary: 128 mg/dL — ABNORMAL HIGH (ref 70–99)

## 2022-04-29 LAB — LACTIC ACID, PLASMA
Lactic Acid, Venous: 4 mmol/L (ref 0.5–1.9)
Lactic Acid, Venous: 2.9 mmol/L (ref 0.5–1.9)
Lactic Acid, Venous: 2.3 mmol/L (ref 0.5–1.9)

## 2022-04-29 LAB — URIC ACID: Uric Acid, Serum: 6 mg/dL (ref 3.7–8.6)

## 2022-04-29 MED ORDER — LACTATED RINGERS IV BOLUS
1000.0000 mL | Freq: Once | INTRAVENOUS | Status: AC
  Administered 2022-04-29: 1000 mL via INTRAVENOUS

## 2022-04-29 MED ORDER — SODIUM CHLORIDE 0.9 % IV BOLUS
1000.0000 mL | Freq: Once | INTRAVENOUS | Status: AC
  Administered 2022-04-29: 1000 mL via INTRAVENOUS

## 2022-04-29 MED ORDER — ONDANSETRON HCL 4 MG/2ML IJ SOLN
4.0000 mg | Freq: Once | INTRAMUSCULAR | Status: AC
  Administered 2022-04-29: 4 mg via INTRAVENOUS
  Filled 2022-04-29: qty 2

## 2022-04-29 MED ORDER — MORPHINE SULFATE (PF) 4 MG/ML IV SOLN
4.0000 mg | Freq: Once | INTRAVENOUS | Status: AC
  Administered 2022-04-29: 4 mg via INTRAVENOUS
  Filled 2022-04-29: qty 1

## 2022-04-29 MED ORDER — HYDROCODONE-ACETAMINOPHEN 5-325 MG PO TABS: 1.0000 | ORAL_TABLET | Freq: Four times a day (QID) | ORAL | 0 refills | Status: DC | PRN

## 2022-04-29 MED ORDER — IOHEXOL 300 MG/ML  SOLN
100.0000 mL | Freq: Once | INTRAMUSCULAR | Status: AC | PRN
  Administered 2022-04-29: 100 mL via INTRAVENOUS

## 2022-04-29 NOTE — ED Provider Notes (Signed)
[facility] Provider Note    Event Date/Time   First MD Initiated Contact with Patient 04/29/22 1503     (approximate)   History   Hyperglycemia and Leg Pain   HPI   Joe Adams is a 61 y.o. 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  He was seen yesterday in the office and a recent PET scan but last night developed severe bilateral CVA area pain radiating to down towards his legs.  He does not have any numbness or weakness.  No vomiting or fever.  His sugar went up last night to 300.  Now it is back to 221.  Pain is not pleuritic.  She is   Past medical history: Thyroid disease [E07.9]    History of kidney stones [Z87.442]    GERD (gastroesophageal reflux disease) [K21.9]    Headache [R51.9]    Acute MI ([place]) [I21.9] 9323   Hernia, umbilical [F57.3]    Diabetes mellitus without complication ([place]) [U20.2]    Large cell lymphoma ([place]) [C85.80]      Physical Exam   Triage Vital Signs: ED Triage Vitals [04/29/22 1441]  Enc Vitals Group     BP 111/69     Pulse Rate 96     Resp 18     Temp (!) 97.5 F (36.4 C)     Temp Source Oral     SpO2 99 %     Weight      Height      Head Circumference      Peak Flow      Pain Score 10     Pain Loc      Pain Edu?      Excl. in [place]?     Most recent vital signs: Vitals:   04/29/22 2254 04/29/22 2300  BP: (!) 128/94 134/87  Pulse: 100 87  Resp: 18   Temp: 98.4 F (36.9 C) 98.2 F (36.8 C)  SpO2:  95%     General: Awake, alert looks somewhat uncomfortable CV:  Good peripheral perfusion.  Heart regular rate and rhythm no audible murmurs Resp:  Normal effort.  Lungs are clear Abd:  No distention.  Soft and nontender Extremities: No edema.  Leg raise is negative there is no weakness no numbness Back: There is no spinal tenderness on palpation or percussion of the spine there is no tenderness in his back percussion of his back however he says the back hurts very badly  both sides of the spine in the CVA bilaterally.  ED Results / Procedures / Treatments   Labs (all labs ordered are listed, but only abnormal results are displayed) Labs Reviewed  URINALYSIS, ROUTINE W REFLEX MICROSCOPIC - Abnormal; Notable for the following components:      Result Value   Color, Urine STRAW (*)    APPearance CLEAR (*)    Specific Gravity, Urine 1.004 (*)    Glucose, UA 50 (*)    All other components within normal limits  COMPREHENSIVE METABOLIC PANEL - Abnormal; Notable for the following components:   Sodium 130 (*)    Chloride 96 (*)    Glucose, Bld 231 (*)    BUN 25 (*)    AST 79 (*)    ALT 137 (*)    Alkaline Phosphatase 238 (*)    All other components within normal limits  LACTIC ACID, PLASMA - Abnormal; Notable for the following components:   Lactic Acid, Venous 4.0 (*)  All other components within normal limits  LACTIC ACID, PLASMA - Abnormal; Notable for the following components:   Lactic Acid, Venous 2.9 (*)    All other components within normal limits  CBC WITH DIFFERENTIAL/PLATELET - Abnormal; Notable for the following components:   WBC 0.9 (*)    Hemoglobin 11.6 (*)    HCT 35.3 (*)    Neutro Abs 0.2 (*)    Lymphs Abs 0.5 (*)    All other components within normal limits  LACTIC ACID, PLASMA - Abnormal; Notable for the following components:   Lactic Acid, Venous 2.3 (*)    All other components within normal limits  CBG MONITORING, ED - Abnormal; Notable for the following components:   Glucose-Capillary 221 (*)    All other components within normal limits  CBG MONITORING, ED - Abnormal; Notable for the following components:   Glucose-Capillary 128 (*)    All other components within normal limits  PATHOLOGIST SMEAR REVIEW     EKG     RADIOLOGY CT read by radiologist as interpreted by me shows some decrease in size of metastasis.  There is no obvious new pathology going on.  PROCEDURES:  Critical Care performed:    Procedures   MEDICATIONS ORDERED IN ED: Medications  morphine (PF) 4 MG/ML injection 4 mg (4 mg Intravenous Given 04/29/22 1542)  ondansetron (ZOFRAN) injection 4 mg (4 mg Intravenous Given 04/29/22 1541)  iohexol (OMNIPAQUE) 300 MG/ML solution 100 mL (100 mLs Intravenous Contrast Given 04/29/22 1621)  sodium chloride 0.9 % bolus 1,000 mL (0 mLs Intravenous Stopped 04/29/22 1750)  lactated ringers bolus 1,000 mL (0 mLs Intravenous Stopped 04/29/22 2113)     IMPRESSION / MDM / ASSESSMENT AND PLAN / ED COURSE  I reviewed the triage vital signs and the nursing notes.  ----------------------------------------- 11:03 PM on 04/29/2022 ----------------------------------------- Patient doing quite well.  His lactic acid is coming down slowly with fluids.  There is no fever.  His labs are okay.  CT is negative.  I am not sure what the pain is related to clinic is actually resolved.  I will let him go.  He will return for any further problems. Differential diagnosis includes, but is not limited to, infection, renal colic or hemorrhage from the cancers none of which have been present.  Patient's presentation is most consistent with acute presentation with potential threat to life or bodily function.  The patient is on the cardiac monitor to evaluate for evidence of arrhythmia and/or significant heart rate changes.  Nothing has been seen. Patient has remained stable the whole time.  I will let him go.   FINAL CLINICAL IMPRESSION(S) / ED DIAGNOSES   Final diagnoses:  Other acute back pain     Rx / DC Orders   ED Discharge Orders          Ordered    HYDROcodone-acetaminophen (NORCO/VICODIN) 5-325 MG tablet  Every 6 hours PRN        04/29/22 2302             Note:  This document was prepared using Dragon voice recognition software and may include unintentional dictation errors.   Nena Polio, MD 04/29/22 [phone]

## 2022-04-29 NOTE — Telephone Encounter (Signed)
CAll returned to [facility] to advise per Dr Tasia Catchings that patient is to take the pain medicinehe has on hand and if no better tomorrow, he should call back and we will see him in [facility]. She informed me that his brother has since come over and has taken patient to the ER.

## 2022-04-29 NOTE — Discharge Instructions (Signed)
Please take the Vicodin 1 pill 4 times a day as needed for pain.  If you need more you can take 2 pills 4 times a day.  Please return for pain uncontrolled by her back or if you have fever or vomiting or any other symptoms.  Please drink a lot of water this evening.  Follow-up with the cancer center as planned.

## 2022-04-29 NOTE — ED Triage Notes (Signed)
Patient to ED via POV for hyperglycemia and bilateral leg/back pain. Patient currently receiving chemo for liver cancer- last treatment 1 week ago.

## 2022-04-29 NOTE — ED Notes (Signed)
DC instructions given verbally and in writing.. understanding voiced, signature obtained.  Pt left in stable condition to POV with family.

## 2022-04-29 NOTE — Telephone Encounter (Signed)
Call from [facility] and patient in background reporting that patient awoke early this morning with severe pain in his legs and kidney area. He is having difficulty walking from the pain and his legs shaking, he is also having pain at his kidney area as well denies any blood in urine or difficulty urinating. He denies any fever. The pain is constant and sharp. Please advise

## 2022-04-29 NOTE — ED Notes (Signed)
Dr. Cinda Quest notified face to face of lactic, orders received.

## 2022-04-30 ENCOUNTER — Other Ambulatory Visit: Payer: Self-pay | Admitting: Oncology

## 2022-04-30 LAB — PATHOLOGIST SMEAR REVIEW

## 2022-04-30 MED ORDER — OXYCODONE-ACETAMINOPHEN 5-325 MG PO TABS: 1.0000 | ORAL_TABLET | Freq: Four times a day (QID) | ORAL | 0 refills | Status: AC | PRN

## 2022-04-30 MED ORDER — OXYCODONE-ACETAMINOPHEN 5-325 MG PO TABS: 1.0000 | ORAL_TABLET | Freq: Four times a day (QID) | ORAL | 0 refills | Status: DC | PRN

## 2022-04-30 MED ORDER — CIPROFLOXACIN HCL 500 MG PO TABS: 500.0000 mg | ORAL_TABLET | Freq: Two times a day (BID) | ORAL | 0 refills | Status: DC

## 2022-04-30 NOTE — Telephone Encounter (Signed)
Patient called back to report that he was told in ED that WBC counts was low.   Dr. Tasia Catchings has sent in prescription for Cipro to [facility].   Dr. Tasia Catchings has discontinued Norco and sent Percocet [facility]. Called [facility] and spoke to [place] to cancel Norco Rx.   When call was returned again to pt to inform him of MD recommendations, pt stated that his mom already picked up [place], so he requested that Percocet be cancelled. [facility] and had to leave message to cancel medication.

## 2022-04-30 NOTE — Telephone Encounter (Signed)
Follow up call made to pt regarding pain and ED visit. Pt reports that he is better and pain improved after he got IVF in ED. 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 Pt verbalized understanding. Also informed him that per medication list, Dr. Cinda Quest (ED MD) had sent Norco to [facility]. Pt states that he does not use [facility], but will call to see if this can be sent to [facility].

## 2022-05-01 NOTE — Progress Notes (Signed)
Received patients signed paperwork for the [facility], still waiting on letter of support. Called and spoke with patient Brother, Zenia Resides. They will bring it in as soon as possible.

## 2022-05-01 NOTE — Telephone Encounter (Signed)
CBC will be rechecked next week when pt has bm bx done.

## 2022-05-01 NOTE — Telephone Encounter (Signed)
Received call from [facility] at [facility] confirming that Percocet rx was cancelled and pt was informed.

## 2022-05-04 ENCOUNTER — Other Ambulatory Visit: Payer: Self-pay | Admitting: Radiology

## 2022-05-04 DIAGNOSIS — C8338 Diffuse large B-cell lymphoma, lymph nodes of multiple sites: Secondary | ICD-10-CM

## 2022-05-04 NOTE — H&P (Signed)
Chief Complaint: Patient was seen in consultation today for large B-cell lymphoma at the request of Yu,Zhou  Referring Physician(s): Yu,Zhou  Supervising Physician: Pernell Dupre  Patient Status: [facility] - Out-pt  History of Present Illness: Joe Adams is a 61 y.o. male with PMH of MI, DM type II and large cell lymphoma.  Patient presented to ED 03/22/2022 complaining of right-sided lower chest, upper abdominal/flank pain, fever and chills, night sweats and unintentional weight loss of 20 to 30 pounds over the past 6 months.  CT AP at that time showed multiple heterogeneously enhancing masses throughout the liver.  He underwent liver lesion biopsy with IR 03/31/2022 that resulted positive for malignancy with findings compatible with large B-cell lymphoma. IR placed port 04/20/22. Patient is followed by oncology and has started chemotherapy.  Patient has been referred to IR once again for bone marrow biopsy and aspiration for additional testing.  Pt denies fever, chills, CP, SOB, abd pain, N/V. He endorses fatigue.  He is NPO per order.   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   Thyroid disease     Past Surgical History:  Procedure Laterality Date   COLONOSCOPY N/A 11/18/2016   Procedure: COLONOSCOPY IN O.R;  Surgeon: Kieth Brightly, MD;  Location: [facility] ORS;  Service: General;  Laterality: N/A;   COLONOSCOPY WITH PROPOFOL N/A 11/18/2016   Procedure: COLONOSCOPY WITH PROPOFOL;  Surgeon: Kieth Brightly, MD;  Location: [facility] ENDOSCOPY;  Service: Endoscopy;  Laterality: N/A;   HEMORRHOID SURGERY N/A 11/18/2016   Procedure: HEMORRHOIDECTOMY;  Surgeon: Kieth Brightly, MD;  Location: [facility] ORS;  Service: General;  Laterality: N/A;   IR IMAGING GUIDED  PORT INSERTION  04/20/2022   TONSILLECTOMY     as a child    Allergies: Patient has no known allergies.  Medications: Prior to Admission medications   Medication Sig Start Date End Date Taking? Authorizing Provider  acyclovir (ZOVIRAX) 400 MG tablet Take 1 tablet (400 mg total) by mouth daily. 04/21/22   Rickard Patience, MD  allopurinol (ZYLOPRIM) 100 MG tablet Take 1 tablet (100 mg total) by mouth daily. 04/08/22   Rickard Patience, MD  ciprofloxacin (CIPRO) 500 MG tablet Take 1 tablet (500 mg total) by mouth 2 (two) times daily. 04/30/22   Rickard Patience, MD  Docusate Calcium (STOOL SOFTENER PO) Take by mouth.    [provider]  lactulose (CHRONULAC) 10 GM/15ML solution Take 15 mLs (10 g total) by mouth daily as needed for mild constipation. 03/24/22   Rickard Patience, MD  levothyroxine (SYNTHROID) 150 MCG tablet Take 150 mcg by mouth daily. 03/09/22   [provider]  lidocaine-prilocaine (EMLA) cream Apply to affected area once 04/20/22   Rickard Patience, MD  metFORMIN (GLUCOPHAGE) 1000 MG tablet 2 (two) times daily. 08/06/21   [provider]  ondansetron (ZOFRAN) 8 MG tablet Take 1 tablet (8 mg total) by mouth every 8 (eight) hours as needed for nausea or vomiting. Start on the third day after cyclophosphamide chemotherapy. Patient not taking: Reported on 04/28/2022 04/21/22   Rickard Patience, MD  oxyCODONE-acetaminophen (PERCOCET) 5-325 MG tablet Take 1 tablet by mouth every 6 (six) hours as needed for severe pain. 04/30/22 04/30/23  Rickard Patience, MD  predniSONE (DELTASONE)  20 MG tablet Take 5 tablets (100 mg total) by mouth daily. Take with food on days 2-5 of chemotherapy. 04/21/22   Rickard Patience, MD  prochlorperazine (COMPAZINE) 10 MG tablet Take 1 tablet (10 mg total) by mouth every 6 (six) hours as needed for nausea or vomiting. Patient not taking: Reported on 04/28/2022 04/21/22   Rickard Patience, MD     Family History  Problem Relation Age of Onset   Cancer Other    Cancer Paternal Uncle     Social History    Socioeconomic History   Marital status: Legally Separated    Spouse name: Not on file   Number of children: Not on file   Years of education: Not on file   Highest education level: Not on file  Occupational History   Not on file  Tobacco Use   Smoking status: Never   Smokeless tobacco: Never  Vaping Use   Vaping Use: Never used  Substance and Sexual Activity   Alcohol use: Not Currently    Alcohol/week: 1.0 standard drink of alcohol    Types: 1 Shots of liquor per week    Comment: occ   Drug use: No   Sexual activity: Not on file  Other Topics Concern   Not on file  Social History Narrative   Not on file   Social Determinants of Health   Financial Resource Strain: Low Risk  (04/15/2022)   Overall Financial Resource Strain (CARDIA)    Difficulty of Paying Living Expenses: Not very hard  Recent Concern: Financial Resource Strain - Medium Risk (04/15/2022)   Overall Financial Resource Strain (CARDIA)    Difficulty of Paying Living Expenses: Somewhat hard  Food Insecurity: No Food Insecurity (04/15/2022)   Hunger Vital Sign    Worried About Running Out of Food in the Last Year: Never true    Ran Out of Food in the Last Year: Never true  Transportation Needs: No Transportation Needs (04/15/2022)   PRAPARE - Administrator, Civil Service (Medical): No    Lack of Transportation (Non-Medical): No  Physical Activity: Inactive (04/15/2022)   Exercise Vital Sign    Days of Exercise per Week: 0 days    Minutes of Exercise per Session: 0 min  Stress: Stress Concern Present (04/15/2022)   [employer] of Occupational Health - Occupational Stress Questionnaire    Feeling of Stress : To some extent  Social Connections: Moderately Integrated (04/15/2022)   Social Connection and Isolation Panel [NHANES]    Frequency of Communication with Friends and Family: Three times a week    Frequency of Social Gatherings with Friends and Family: Twice a week    Attends  Religious Services: 1 to 4 times per year    Active Member of [employer] or Organizations: No    Attends Banker Meetings: Never    Marital Status: Living with partner     Review of Systems: A 12 point ROS discussed and pertinent positives are indicated in the HPI above.  All other systems are negative.  Review of Systems  Constitutional:  Positive for fatigue. Negative for chills and fever.  Respiratory:  Negative for shortness of breath.   Cardiovascular:  Negative for chest pain and leg swelling.  Gastrointestinal:  Negative for abdominal pain, nausea and vomiting.  Neurological:  Negative for dizziness, weakness and headaches.    Vital Signs: BP 129/88   Pulse (!) 106   Resp 16   Ht 5\' 6"  (1.676 m)  Wt 165 lb (74.8 kg)   SpO2 96%   BMI 26.63 kg/m     Physical Exam Vitals reviewed.  Constitutional:      General: He is not in acute distress.    Appearance: Normal appearance. He is not ill-appearing.  HENT:     Head: Normocephalic and atraumatic.     Mouth/Throat:     Mouth: Mucous membranes are dry.     Pharynx: Oropharynx is clear.  Eyes:     Extraocular Movements: Extraocular movements intact.     Pupils: Pupils are equal, round, and reactive to light.  Cardiovascular:     Rate and Rhythm: Regular rhythm. Tachycardia present.     Pulses: Normal pulses.     Heart sounds: Normal heart sounds. No murmur heard. Pulmonary:     Effort: Pulmonary effort is normal. No respiratory distress.     Breath sounds: Normal breath sounds.  Abdominal:     General: Bowel sounds are normal. There is no distension.     Palpations: Abdomen is soft.     Tenderness: There is no abdominal tenderness. There is no guarding.  Musculoskeletal:     Right lower leg: No edema.     Left lower leg: No edema.  Skin:    General: Skin is warm and dry.     Comments: Right chest port  Neurological:     Mental Status: He is alert and oriented to person, place, and time.   Psychiatric:        Mood and Affect: Mood normal.        Behavior: Behavior normal.        Thought Content: Thought content normal.        Judgment: Judgment normal.     Imaging: CT ABDOMEN PELVIS W CONTRAST  Result Date: 04/29/2022 CLINICAL DATA:  BILATERAL leg and back pain, flank pain, question kidney stone versus metastatic disease, history diabetes mellitus, large B-cell lymphoma, MI, GERD EXAM: CT ABDOMEN AND PELVIS WITH CONTRAST TECHNIQUE: Multidetector CT imaging of the abdomen and pelvis was performed using the standard protocol following bolus administration of intravenous contrast. RADIATION DOSE REDUCTION: This exam was performed according to the departmental dose-optimization program which includes automated exposure control, adjustment of the mA and/or kV according to patient size and/or use of iterative reconstruction technique. CONTRAST:  OMNIPAQUE IOHEXOL 300 MG/ML SOLN IV. No oral contrast. COMPARISON:  03/22/2022 FINDINGS: Lower chest: Dependent bibasilar atelectasis Hepatobiliary: Multiple ill-defined hepatic masses consistent with metastatic disease. RIGHT lobe mass 7.0 x 5.2 cm, previously 8.7 x 6.3 cm. Remaining masses also appear decreased in sizes since previous study. Gallbladder unremarkable. No biliary dilatation. Pancreas: Normal appearance Spleen: Normal appearance Adrenals/Urinary Tract: Adrenal glands, kidneys, ureters, and bladder normal appearance. No urinary tract calcification or dilatation. Stomach/Bowel: Normal appendix. Stomach and bowel loops normal appearance. Vascular/Lymphatic: Vascular structures patent.  No adenopathy. Reproductive: Minimal prostatic enlargement with central prostate gland calcifications. Other: Small RIGHT inguinal hernia containing fat. No free air or free fluid. Musculoskeletal: Limbus vertebra L5.  No acute osseous findings. IMPRESSION: Multiple ill-defined hepatic masses consistent with metastatic disease, decreased in sizes since  previous exam. Decreased size of a portacaval lymph node since prior study. Small RIGHT inguinal hernia containing fat. Minimal prostatic enlargement. No new intra-abdominal or intrapelvic abnormalities. Electronically Signed   By: Ulyses Southward M.D.   On: 04/29/2022 16:39   IR IMAGING GUIDED PORT INSERTION  Result Date: 04/20/2022 CLINICAL DATA:  Diffuse large B-cell lymphoma and need for porta  cath to begin chemotherapy. EXAM: IMPLANTED PORT A CATH PLACEMENT WITH ULTRASOUND AND FLUOROSCOPIC GUIDANCE ANESTHESIA/SEDATION: Moderate (conscious) sedation was employed during this procedure. A total of Versed 2.0 mg and Fentanyl 100 mcg was administered intravenously by radiology nursing. Moderate Sedation Time: 26 minutes. The patient's level of consciousness and vital signs were monitored continuously by radiology nursing throughout the procedure under my direct supervision. FLUOROSCOPY: 24 seconds.  5.8 mGy. PROCEDURE: The procedure, risks, benefits, and alternatives were explained to the patient. Questions regarding the procedure were encouraged and answered. The patient understands and consents to the procedure. A time-out was performed prior to initiating the procedure. Ultrasound was utilized to confirm patency of the right internal jugular vein. A permanent ultrasound image was recorded and saved. The right neck and chest were prepped with chlorhexidine in a sterile fashion, and a sterile drape was applied covering the operative field. Maximum barrier sterile technique with sterile gowns and gloves were used for the procedure. Local anesthesia was provided with 1% lidocaine. After creating a small venotomy incision, a 21 gauge needle was advanced into the right internal jugular vein under direct, real-time ultrasound guidance. Ultrasound image documentation was performed. After securing guidewire access, an 8 Fr dilator was placed. A J-wire was kinked to measure appropriate catheter length. A subcutaneous port  pocket was then created along the upper chest wall utilizing sharp and blunt dissection. Portable cautery was utilized. The pocket was irrigated with sterile saline. A single lumen power injectable port was chosen for placement. The 8 Fr catheter was tunneled from the port pocket site to the venotomy incision. The port was placed in the pocket. External catheter was trimmed to appropriate length based on guidewire measurement. At the venotomy, an 8 Fr peel-away sheath was placed over a guidewire. The catheter was then placed through the sheath and the sheath removed. Final catheter positioning was confirmed and documented with a fluoroscopic spot image. The port was accessed with a needle and aspirated and flushed with heparinized saline. The access needle was removed. The venotomy and port pocket incisions were closed with subcutaneous 3-0 Monocryl and subcuticular 4-0 Vicryl. Dermabond was applied to both incisions. COMPLICATIONS: COMPLICATIONS None FINDINGS: After catheter placement, the tip lies at the cavo-atrial junction. The catheter aspirates normally and is ready for immediate use. IMPRESSION: Placement of single lumen port a cath via right internal jugular vein. The catheter tip lies at the cavo-atrial junction. A power injectable port a cath was placed and is ready for immediate use. Electronically Signed   By: Irish Lack M.D.   On: 04/20/2022 16:03   NM PET Image Initial (PI) Skull Base To Thigh  Result Date: 04/19/2022 CLINICAL DATA:  Initial treatment strategy for large B-cell lymphoma. EXAM: NUCLEAR MEDICINE PET SKULL BASE TO THIGH TECHNIQUE: 9.0 mCi F-18 FDG was injected intravenously. Full-ring PET imaging was performed from the skull base to thigh after the radiotracer. CT data was obtained and used for attenuation correction and anatomic localization. Fasting blood glucose: 110 mg/dl COMPARISON:  09/81/1914 chest CT angiogram and CT abdomen/pelvis. 03/22/2022 MRI abdomen. FINDINGS:  Mediastinal blood pool activity: SUV max 1.0 Liver activity: SUV max 1.2 NECK: No hypermetabolic lymph nodes in the neck. Mildly enlarged multinodular goiter with hypermetabolic thyroid nodules, largest 3.0 cm on the right with max SUV 25.1 (series 2/image 61). Incidental CT findings: None. CHEST: No enlarged or hypermetabolic axillary, mediastinal or hilar lymph nodes. No hypermetabolic pulmonary findings. Incidental CT findings: No significant pulmonary nodules. Atherosclerotic nonaneurysmal thoracic aorta.  ABDOMEN/PELVIS: Multiple intensely hypermetabolic hypodense liver masses scattered throughout both liver lobes. Representative centrally necrotic 11.2 x 9.2 cm right liver mass with max SUV 26.3 (series 2/image 146). Representative segment 2 left liver 4.7 x 4.2 cm mass with max SUV 21.9 (series 2/image 124). Mild splenomegaly with craniocaudal splenic length 15.9 cm. Splenic parenchymal metabolism (max SUV 1.6) is slightly above liver parenchymal metabolism. No discrete splenic masses. No abnormal hypermetabolic activity within the pancreas, adrenal glands, or spleen. No hypermetabolic lymph nodes in the abdomen or pelvis. Incidental CT findings: Mild sigmoid diverticulosis. Mildly atherosclerotic nonaneurysmal abdominal aorta. SKELETON: No focal hypermetabolic activity to suggest skeletal metastasis. Incidental CT findings: None. IMPRESSION: 1. Multiple bulky intensely hypermetabolic liver masses throughout both liver lobes, compatible with reported lymphoma. Deauville score 5. 2. Mild splenomegaly with mild splenic hypermetabolism, compatible with lymphoma. No enlarged or hypermetabolic lymph nodes. No hypermetabolic skeletal activity. 3. 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 4. Chronic findings include: Aortic Atherosclerosis (ICD10-I70.0). Mild sigmoid diverticulosis. Electronically Signed   By: Delbert Phenix M.D.   On: 04/19/2022 13:28    ECHOCARDIOGRAM COMPLETE  Result Date: 04/06/2022    ECHOCARDIOGRAM REPORT   Patient Name:   Joe Adams [facility] Date of Exam: 04/06/2022 Medical Rec #:  [id number]      Height:       66.0 in Accession #:    [id number]     Weight:       165.2 lb Date of Birth:  February 23, 1961       BSA:          1.844 m Patient Age:    61 years       BP:           113/90 mmHg Patient Gender: M              HR:           100 bpm. Exam Location:  [facility] Procedure: 2D Echo, Cardiac Doppler and Color Doppler Indications:     Z09 Chemo  History:         Patient has no prior history of Echocardiogram examinations.                  Previous Myocardial Infarction, Lymphoma; Risk                  Factors:Diabetes.  Sonographer:     Ceasar Mons Referring Phys:  [id number] ZHOU YU Diagnosing Phys: Arnoldo Hooker MD  Sonographer Comments: Image acquisition challenging due to respiratory motion. IMPRESSIONS  1. Left ventricular ejection fraction, by estimation, is 65 to 70%. The left ventricle has normal function. The left ventricle has no regional wall motion abnormalities. Left ventricular diastolic parameters were normal.  2. Right ventricular systolic function is normal. The right ventricular size is normal.  3. The mitral valve is normal in structure. Trivial mitral valve regurgitation.  4. The aortic valve is normal in structure. Aortic valve regurgitation is not visualized. FINDINGS  Left Ventricle: Left ventricular ejection fraction, by estimation, is 65 to 70%. The left ventricle has normal function. The left ventricle has no regional wall motion abnormalities. The left ventricular internal cavity size was normal in size. There is  no left ventricular hypertrophy. Left ventricular diastolic parameters were normal. Right Ventricle: The right ventricular size is normal. No increase in right ventricular wall thickness. Right ventricular systolic function is normal. Left Atrium: Left atrial size was normal in size. Right Atrium: Right  atrial size  was normal in size. Pericardium: There is no evidence of pericardial effusion. Mitral Valve: The mitral valve is normal in structure. Trivial mitral valve regurgitation. Tricuspid Valve: The tricuspid valve is normal in structure. Tricuspid valve regurgitation is trivial. Aortic Valve: The aortic valve is normal in structure. Aortic valve regurgitation is not visualized. Aortic valve mean gradient measures 4.0 mmHg. Aortic valve peak gradient measures 7.0 mmHg. Aortic valve area, by VTI measures 2.10 cm. Pulmonic Valve: The pulmonic valve was normal in structure. Pulmonic valve regurgitation is trivial. Aorta: The aortic root and ascending aorta are structurally normal, with no evidence of dilitation. IAS/Shunts: No atrial level shunt detected by color flow Doppler.  LEFT VENTRICLE PLAX 2D LVIDd:         3.60 cm   Diastology LVIDs:         2.10 cm   LV e' medial:    7.29 cm/s LV PW:         1.10 cm   LV E/e' medial:  6.5 LV IVS:        1.00 cm   LV e' lateral:   9.36 cm/s LVOT diam:     1.70 cm   LV E/e' lateral: 5.0 LV SV:         46 LV SV Index:   25 LVOT Area:     2.27 cm                           3D Volume EF:                          3D EF:        55 %                          LV EDV:       174 ml                          LV ESV:       78 ml                          LV SV:        96 ml RIGHT VENTRICLE RV Basal diam:  2.40 cm RV Mid diam:    1.80 cm RV S prime:     13.90 cm/s TAPSE (M-mode): 1.9 cm LEFT ATRIUM             Index        RIGHT ATRIUM           Index LA diam:        3.90 cm 2.12 cm/m   RA Area:     14.30 cm LA Vol (A2C):   31.0 ml 16.81 ml/m  RA Volume:   29.90 ml  16.22 ml/m LA Vol (A4C):   27.4 ml 14.86 ml/m LA Biplane Vol: 30.5 ml 16.54 ml/m  AORTIC VALVE AV Area (Vmax):    2.12 cm AV Area (Vmean):   2.05 cm AV Area (VTI):     2.10 cm AV Vmax:           132.00 cm/s AV Vmean:          89.800 cm/s AV VTI:            0.217 m AV Peak  Grad:      7.0 mmHg AV Mean Grad:      4.0 mmHg LVOT  Vmax:         123.00 cm/s LVOT Vmean:        81.000 cm/s LVOT VTI:          0.201 m LVOT/AV VTI ratio: 0.93  AORTA Ao Root diam: 3.10 cm MITRAL VALVE MV Area (PHT): 3.68 cm    SHUNTS MV Decel Time: 206 msec    Systemic VTI:  0.20 m MV E velocity: 47.20 cm/s  Systemic Diam: 1.70 cm MV A velocity: 72.30 cm/s MV E/A ratio:  0.65 Arnoldo Hooker MD Electronically signed by Arnoldo Hooker MD Signature Date/Time: 04/06/2022/12:56:20 PM    Final     Labs:  CBC: Recent Labs    04/21/22 0848 04/28/22 0833 04/29/22 1524 05/05/22 0800  WBC 8.0 5.3 0.9* 22.1*  HGB 13.1 11.9* 11.6* 11.6*  HCT 39.9 36.0* 35.3* 35.1*  PLT 308 200 153 109*    COAGS: Recent Labs    03/24/22 1019 03/31/22 0757  INR 1.2 1.2  APTT 38*  --     BMP: Recent Labs    03/22/22 1152 04/21/22 0848 04/28/22 0833 04/29/22 1524  NA 138 135 134* 130*  K 3.9 4.5 4.1 4.4  CL 100 102 96* 96*  CO2 23 20* 27 24  GLUCOSE 180* 133* 157* 231*  BUN 37* 28* 27* 25*  CALCIUM 9.3 10.5* 9.4 9.6  CREATININE 1.63* 0.78 0.57* 0.74  GFRNONAA 48* >60 >60 >60    LIVER FUNCTION TESTS: Recent Labs    03/24/22 1019 04/21/22 0848 04/28/22 0833 04/29/22 1524  BILITOT 0.8 0.5 0.8 1.0  AST 24 46* 69* 79*  ALT 18 25 127* 137*  ALKPHOS 218* 260* 195* 238*  PROT 7.9 7.8 6.7 6.8  ALBUMIN 3.4* 3.7 3.6 3.7    TUMOR MARKERS: No results for input(s): "AFPTM", "CEA", "CA199", "CHROMGRNA" in the last 8760 hours.  Assessment and Plan: 60 year old male with recent diagnosis of large B-cell lymphoma with recent diagnosis of large B-cell lymphoma who is well-known to IR service presents for bone marrow biopsy.  Pt resting on stretcher.  He is A&O, calm and pleasant.  He is in no distress.   Risks and benefits of bone marrow biopsy and aspiration with moderate sedation was discussed with the patient and/or patient's family including, but not limited to bleeding, infection, damage to adjacent structures or low yield requiring additional  tests.  All of the questions were answered and there is agreement to proceed.  Consent signed and in chart.  Thank you for this interesting consult.  I greatly enjoyed meeting JATAVIAN KONOPKA and look forward to participating in their care.  A copy of this report was sent to the requesting provider on this date.  Electronically Signed: Shon Hough, NP 05/05/2022, 8:26 AM   I spent a total of 20 minutes  in face to face in clinical consultation, greater than 50% of which was counseling/coordinating care for large B-cell lymphoma.

## 2022-05-04 NOTE — Progress Notes (Signed)
Patient for Bone Marrow Biopsy on Tues 05/05/2022, I called and spoke with the patient on the phone and gave pre-procedure instructions. Pt was made aware to be here at 7:30a at the new entrance, NPO after MN prior to procedure as well as driver post procedure/recovery/discharge. Pt stated understanding.  Called 05/04/2022

## 2022-05-05 ENCOUNTER — Ambulatory Visit
Admission: RE | Admit: 2022-05-05 | Discharge: 2022-05-05 | Disposition: A | Discharge status: Home / Self Care | Payer: 59 | Source: Ambulatory Visit | Attending: Oncology | Admitting: Oncology

## 2022-05-05 ENCOUNTER — Other Ambulatory Visit: Payer: Self-pay

## 2022-05-05 DIAGNOSIS — D649 Anemia, unspecified: Secondary | ICD-10-CM | POA: Diagnosis not present

## 2022-05-05 DIAGNOSIS — C8580 Other specified types of non-Hodgkin lymphoma, unspecified site: Secondary | ICD-10-CM

## 2022-05-05 DIAGNOSIS — D72829 Elevated white blood cell count, unspecified: Secondary | ICD-10-CM | POA: Insufficient documentation

## 2022-05-05 DIAGNOSIS — I252 Old myocardial infarction: Secondary | ICD-10-CM | POA: Diagnosis not present

## 2022-05-05 DIAGNOSIS — E119 Type 2 diabetes mellitus without complications: Secondary | ICD-10-CM | POA: Diagnosis not present

## 2022-05-05 DIAGNOSIS — C8510 Unspecified B-cell lymphoma, unspecified site: Secondary | ICD-10-CM | POA: Insufficient documentation

## 2022-05-05 DIAGNOSIS — C8338 Diffuse large B-cell lymphoma, lymph nodes of multiple sites: Secondary | ICD-10-CM

## 2022-05-05 LAB — CBC WITH DIFFERENTIAL/PLATELET
Abs Immature Granulocytes: 0 10*3/uL (ref 0.00–0.07)
Basophils Absolute: 0 10*3/uL (ref 0.0–0.1)
Basophils Relative: 0 %
Eosinophils Absolute: 0 10*3/uL (ref 0.0–0.5)
Eosinophils Relative: 0 %
HCT: 35.1 % — ABNORMAL LOW (ref 39.0–52.0)
Hemoglobin: 11.6 g/dL — ABNORMAL LOW (ref 13.0–17.0)
Lymphocytes Relative: 22 %
Lymphs Abs: 4.9 10*3/uL — ABNORMAL HIGH (ref 0.7–4.0)
MCH: 27.2 pg (ref 26.0–34.0)
MCHC: 33 g/dL (ref 30.0–36.0)
MCV: 82.2 fL (ref 80.0–100.0)
Monocytes Absolute: 2.2 10*3/uL — ABNORMAL HIGH (ref 0.1–1.0)
Monocytes Relative: 10 %
Neutro Abs: 15 10*3/uL — ABNORMAL HIGH (ref 1.7–7.7)
Neutrophils Relative %: 68 %
Platelets: 109 10*3/uL — ABNORMAL LOW (ref 150–400)
RBC: 4.27 MIL/uL (ref 4.22–5.81)
RDW: 15.4 % (ref 11.5–15.5)
Smear Review: NORMAL
WBC: 22.1 10*3/uL — ABNORMAL HIGH (ref 4.0–10.5)
nRBC: 0.3 % — ABNORMAL HIGH (ref 0.0–0.2)

## 2022-05-05 LAB — GLUCOSE, CAPILLARY
Glucose-Capillary: 116 mg/dL — ABNORMAL HIGH (ref 70–99)
Glucose-Capillary: 212 mg/dL — ABNORMAL HIGH (ref 70–99)

## 2022-05-05 MED ORDER — HEPARIN SOD (PORK) LOCK FLUSH 100 UNIT/ML IV SOLN
INTRAVENOUS | Status: AC
  Filled 2022-05-05: qty 5

## 2022-05-05 MED ORDER — SODIUM CHLORIDE 0.9 % IV SOLN: INTRAVENOUS | Status: DC

## 2022-05-05 MED ORDER — LIDOCAINE HCL (PF) 1 % IJ SOLN
10.0000 mL | Freq: Once | INTRAMUSCULAR | Status: AC
  Administered 2022-05-05: 10 mL via INTRADERMAL
  Filled 2022-05-05: qty 10

## 2022-05-05 MED ORDER — MIDAZOLAM HCL 2 MG/2ML IJ SOLN
INTRAMUSCULAR | Status: AC
  Filled 2022-05-05: qty 4

## 2022-05-05 MED ORDER — FENTANYL CITRATE (PF) 100 MCG/2ML IJ SOLN
INTRAMUSCULAR | Status: AC | PRN
  Administered 2022-05-05: 25 ug via INTRAVENOUS
  Administered 2022-05-05: 50 ug via INTRAVENOUS

## 2022-05-05 MED ORDER — MIDAZOLAM HCL 2 MG/2ML IJ SOLN
INTRAMUSCULAR | Status: AC | PRN
  Administered 2022-05-05 (×2): .5 mg via INTRAVENOUS

## 2022-05-05 MED ORDER — FENTANYL CITRATE (PF) 100 MCG/2ML IJ SOLN
INTRAMUSCULAR | Status: AC
  Filled 2022-05-05: qty 2

## 2022-05-05 NOTE — Procedures (Signed)
Interventional Radiology Procedure Note  Date of Procedure: 05/05/2022  Procedure: BMBx  Findings:  1. BMBx right ilium    Complications: No immediate complications noted.   Estimated Blood Loss: minimal  Follow-up and Recommendations: 1. Bedrest 1 hour    Albin Felling, MD  Vascular & Interventional Radiology  05/05/2022 9:38 AM

## 2022-05-06 LAB — SURGICAL PATHOLOGY

## 2022-05-13 ENCOUNTER — Encounter (HOSPITAL_COMMUNITY): Payer: Self-pay | Admitting: Oncology

## 2022-05-18 ENCOUNTER — Inpatient Hospital Stay (HOSPITAL_BASED_OUTPATIENT_CLINIC_OR_DEPARTMENT_OTHER): Payer: 59 | Admitting: Oncology

## 2022-05-18 ENCOUNTER — Inpatient Hospital Stay: Payer: 59

## 2022-05-18 ENCOUNTER — Encounter: Payer: Self-pay | Admitting: Oncology

## 2022-05-18 VITALS — BP 112/88 | HR 67 | Resp 18

## 2022-05-18 VITALS — BP 126/79 | HR 90 | Temp 97.0°F | Wt 175.7 lb

## 2022-05-18 DIAGNOSIS — C8338 Diffuse large B-cell lymphoma, lymph nodes of multiple sites: Secondary | ICD-10-CM

## 2022-05-18 DIAGNOSIS — R634 Abnormal weight loss: Secondary | ICD-10-CM | POA: Diagnosis not present

## 2022-05-18 DIAGNOSIS — G893 Neoplasm related pain (acute) (chronic): Secondary | ICD-10-CM | POA: Diagnosis not present

## 2022-05-18 DIAGNOSIS — Z5111 Encounter for antineoplastic chemotherapy: Secondary | ICD-10-CM

## 2022-05-18 LAB — CBC WITH DIFFERENTIAL/PLATELET
Abs Immature Granulocytes: 0.32 10*3/uL — ABNORMAL HIGH (ref 0.00–0.07)
Basophils Absolute: 0.2 10*3/uL — ABNORMAL HIGH (ref 0.0–0.1)
Basophils Relative: 2 %
Eosinophils Absolute: 0.5 10*3/uL (ref 0.0–0.5)
Eosinophils Relative: 6 %
HCT: 37.4 % — ABNORMAL LOW (ref 39.0–52.0)
Hemoglobin: 12.8 g/dL — ABNORMAL LOW (ref 13.0–17.0)
Immature Granulocytes: 4 %
Lymphocytes Relative: 13 %
Lymphs Abs: 1.2 10*3/uL (ref 0.7–4.0)
MCH: 28.5 pg (ref 26.0–34.0)
MCHC: 34.2 g/dL (ref 30.0–36.0)
MCV: 83.3 fL (ref 80.0–100.0)
Monocytes Absolute: 1.4 10*3/uL — ABNORMAL HIGH (ref 0.1–1.0)
Monocytes Relative: 15 %
Neutro Abs: 5.4 10*3/uL (ref 1.7–7.7)
Neutrophils Relative %: 60 %
Platelets: 267 10*3/uL (ref 150–400)
RBC: 4.49 MIL/uL (ref 4.22–5.81)
RDW: 18 % — ABNORMAL HIGH (ref 11.5–15.5)
WBC: 9 10*3/uL (ref 4.0–10.5)
nRBC: 0 % (ref 0.0–0.2)

## 2022-05-18 LAB — COMPREHENSIVE METABOLIC PANEL
ALT: 23 U/L (ref 0–44)
AST: 25 U/L (ref 15–41)
Albumin: 4.1 g/dL (ref 3.5–5.0)
Alkaline Phosphatase: 138 U/L — ABNORMAL HIGH (ref 38–126)
Anion gap: 6 (ref 5–15)
BUN: 26 mg/dL — ABNORMAL HIGH (ref 8–23)
CO2: 21 mmol/L — ABNORMAL LOW (ref 22–32)
Calcium: 9.1 mg/dL (ref 8.9–10.3)
Chloride: 106 mmol/L (ref 98–111)
Creatinine, Ser: 0.75 mg/dL (ref 0.61–1.24)
GFR, Estimated: >60 mL/min (ref >60)
Glucose, Bld: 136 mg/dL — ABNORMAL HIGH (ref 70–99)
Potassium: 4.4 mmol/L (ref 3.5–5.1)
Sodium: 133 mmol/L — ABNORMAL LOW (ref 135–145)
Total Bilirubin: 0.4 mg/dL (ref 0.3–1.2)
Total Protein: 7.2 g/dL (ref 6.5–8.1)

## 2022-05-18 LAB — LACTATE DEHYDROGENASE: LDH: 137 U/L (ref 98–192)

## 2022-05-18 MED ORDER — ACETAMINOPHEN 325 MG PO TABS
650.0000 mg | ORAL_TABLET | Freq: Once | ORAL | Status: AC
  Administered 2022-05-18: 650 mg via ORAL
  Filled 2022-05-18: qty 2

## 2022-05-18 MED ORDER — HEPARIN SOD (PORK) LOCK FLUSH 100 UNIT/ML IV SOLN
500.0000 [IU] | Freq: Once | INTRAVENOUS | Status: AC | PRN
  Administered 2022-05-18: 500 [IU]
  Filled 2022-05-18: qty 5

## 2022-05-18 MED ORDER — DIPHENHYDRAMINE HCL 25 MG PO CAPS
50.0000 mg | ORAL_CAPSULE | Freq: Once | ORAL | Status: AC
  Administered 2022-05-18: 50 mg via ORAL
  Filled 2022-05-18: qty 2

## 2022-05-18 MED ORDER — SODIUM CHLORIDE 0.9 % IV SOLN
Freq: Once | INTRAVENOUS | Status: AC
  Filled 2022-05-18: qty 250

## 2022-05-18 MED ORDER — SODIUM CHLORIDE 0.9 % IV SOLN
375.0000 mg/m2 | Freq: Once | INTRAVENOUS | Status: AC
  Administered 2022-05-18: 700 mg via INTRAVENOUS
  Filled 2022-05-18: qty 20

## 2022-05-18 MED ORDER — FAMOTIDINE IN NACL 20-0.9 MG/50ML-% IV SOLN
20.0000 mg | Freq: Once | INTRAVENOUS | Status: AC
  Administered 2022-05-18: 20 mg via INTRAVENOUS
  Filled 2022-05-18: qty 50

## 2022-05-18 MED FILL — Fosaprepitant Dimeglumine For IV Infusion 150 MG (Base Eq): INTRAVENOUS | Qty: 5 | Status: AC

## 2022-05-18 NOTE — Assessment & Plan Note (Signed)
Due to lymphoma. He has gained weight.  Encourage nutritional supplementation.

## 2022-05-18 NOTE — Patient Instructions (Signed)
[facility] AT [place]  Discharge Instructions: Thank you for choosing [facility] to provide your oncology and hematology care.  If you have a lab appointment with the [facility], please go directly to the [facility] and check in at the registration area.  Wear comfortable clothing and clothing appropriate for easy access to any Portacath or PICC line.   We strive to give you quality time with your provider. You may need to reschedule your appointment if you arrive late (15 or more minutes).  Arriving late affects you and other patients whose appointments are after yours.  Also, if you miss three or more appointments without notifying the office, you may be dismissed from the clinic at the provider's discretion.      For prescription refill requests, have your pharmacy contact our office and allow 72 hours for refills to be completed.    Today you received the following chemotherapy and/or immunotherapy agents Rutuximab      To help prevent nausea and vomiting after your treatment, we encourage you to take your nausea medication as directed.  BELOW ARE SYMPTOMS THAT SHOULD BE REPORTED IMMEDIATELY: *FEVER GREATER THAN 100.4 F (38 C) OR HIGHER *CHILLS OR SWEATING *NAUSEA AND VOMITING THAT IS NOT CONTROLLED WITH YOUR NAUSEA MEDICATION *UNUSUAL SHORTNESS OF BREATH *UNUSUAL BRUISING OR BLEEDING *URINARY PROBLEMS (pain or burning when urinating, or frequent urination) *BOWEL PROBLEMS (unusual diarrhea, constipation, pain near the anus) TENDERNESS IN MOUTH AND THROAT WITH OR WITHOUT PRESENCE OF ULCERS (sore throat, sores in mouth, or a toothache) UNUSUAL RASH, SWELLING OR PAIN  UNUSUAL VAGINAL DISCHARGE OR ITCHING   Items with * indicate a potential emergency and should be followed up as soon as possible or go to the Emergency Department if any problems should occur.  Please show the CHEMOTHERAPY ALERT CARD or IMMUNOTHERAPY ALERT CARD at check-in to  the Emergency Department and triage nurse.  Should you have questions after your visit or need to cancel or reschedule your appointment, please contact [facility] [place] [facility] [place]  [phone] and follow the prompts.  Office hours are 8:00 a.m. to 4:30 p.m. Monday - Friday. Please note that voicemails left after 4:00 p.m. may not be returned until the following business day.  We are closed weekends and major holidays. You have access to a nurse at all times for urgent questions. Please call the main number to the clinic [phone] and follow the prompts.  For any non-urgent questions, you may also contact your provider using MyChart. We now offer e-Visits for anyone 82 and older to request care online for non-urgent symptoms. For details visit [email or web address].   Also download the MyChart app! Go to the app store, search "MyChart", open the app, select [facility], and log in with your MyChart username and password.  Masks are optional in the cancer centers. If you would like for your care team to wear a mask while they are taking care of you, please let them know. For doctor visits, patients may have with them one support person who is at least 61 years old. At this time, visitors are not allowed in the infusion area.

## 2022-05-18 NOTE — Assessment & Plan Note (Addendum)
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  PET scan was reviewed and discussed with patient.- liver, spleen, possible  thyroid,  involvements.  Marrow is negative on PET.  bone marrow biopsy is negative.   IPI score is 4. CNS IPI - 4 Labs are reviewed and discussed with patient. Proceed with cycle 2 R-Pola -CHP with GCSF - add IT MTX/hydrocortisone. Recommend patient to take D2-5 prednisone continue allopurinol and acyclovir.

## 2022-05-18 NOTE — Progress Notes (Signed)
Hematology/Oncology Progress note [phone] [phone]       Patient Care Team: Ranae Plumber, [place] as PCP - General (Family Medicine) Ranae Plumber, [place] (Family Medicine) Christene Lye, MD (General Surgery) Clent Jacks, RN as Oncology Nurse Navigator  ASSESSMENT & PLAN:   Cancer Staging  Diffuse large B cell lymphoma [facility]) Staging form: Hodgkin and Non-Hodgkin Lymphoma, AJCC 8th Edition - Clinical stage from 03/31/2022: Stage IV (Diffuse large B-cell lymphoma) - Signed by [name], MD on 04/19/2022   Diffuse large B cell lymphoma ([place]) BCL6 cmyc IHC positive, FISH negative for BCL2, BCL6, and CMYC rearrangement.  PET scan was reviewed and discussed with patient.- liver, spleen, possible  thyroid,  involvements.  Marrow is negative on PET.  bone marrow biopsy is negative.   IPI score is 4. CNS IPI - 4 Labs are reviewed and discussed with patient. Proceed with cycle 2 R-[place]ola -CHP with GCSF - add IT MTX/hydrocortisone. Recommend patient to take D2-5 prednisone continue allopurinol and acyclovir.   Unintentional weight loss Due to lymphoma. He has gained weight.  Encourage nutritional supplementation.   Encounter for antineoplastic chemotherapy Treatment plan as listed above  Neoplasm related pain oxycodone PRN as directed. Pain has improved.   He declines influenza vaccination today. He will get vaccination with his PCP or local pharmacy  Orders Placed This Encounter  Procedures   NM PET Image Restage (PS) Skull Base to Thigh (F-18 FDG)    Standing Status:   Future    Standing Expiration Date:   05/19/2023    Order Specific Question:   If indicated for the ordered procedure, I authorize the administration of a radiopharmaceutical per Radiology protocol    Answer:   Yes    Order Specific Question:   Preferred imaging location?    Answer:   [facility]    Order Specific Question:   Radiology Contrast Protocol - do NOT remove  file path    Answer:   [email or web address]   DG FLUORO GUIDED LOC OF NEEDLE/CATH TIP FOR SPINAL INJECT LT    Standing Status:   Future    Standing Expiration Date:   05/19/2023    Order Specific Question:   Lab orders requested (DO NOT place separate lab orders, these will be automatically ordered during procedure specimen collection):    Answer:   None    Order Specific Question:   Reason for Exam (SYMPTOM  OR DIAGNOSIS REQUIRED)    Answer:   large B cell lymphoma; Intrathecal Methotrexate and hydrocortisone    Order Specific Question:   Preferred Imaging Location?    Answer:   [facility]    Order Specific Question:   Radiology Contrast Protocol - do NOT remove file path    Answer:   [email or web address]   Lactate dehydrogenase    Standing Status:   Future    Standing Expiration Date:   06/09/2023   CBC with Differential    Standing Status:   Future    Standing Expiration Date:   06/09/2023   Comprehensive metabolic panel    Standing Status:   Future    Standing Expiration Date:   06/09/2023   Follow up  2 weeks lab MD R- pola CHP IT MTX  D3 udenyca  All questions were answered. The patient knows to call the clinic with any problems, questions or concerns.  [name], MD, PhD [facility] Hematology Oncology 05/18/2022    CHIEF COMPLAINTS/REASON FOR VISIT:  Stage IV DLBCL  HISTORY  OF PRESENTING ILLNESS:   Joe Adams is a  61 y.o.  male with PMH listed below was seen in consultation at the request of  [name], MD  for evaluation of liver mass.  03/22/2022, patient presented to emergency room for evaluation of right side lower chest/upper abdomen/flank pain, intermittent low-grade fever and chills, night sweats and unintentional weight loss of 20 to 30 pounds over the past 6 months.  He finished a course of antibiotics a few months ago for unknown infection and symptom has not  improved.  Oncology History  Diffuse large B cell lymphoma ([place])  03/22/2022 Imaging   CT chest angiogram, CT abdomen pelvis w contrast  1. No acute pulmonary embolism. 2. There are multiple heterogeneously enhancing masses throughout the liver, several which demonstrate capsular retraction. No definitive extrahepatic primary is identified. Findings could reflect multifocal hepatocellular carcinoma/cholangiocarcinoma, lymphoma or abscess. Recommend correlation with blood chemistry values. Liver MRI with and without contrast could be of assistance in further characterizing these masses. 3. Splenomegaly.4. Multiple borderline enlarged lymph nodes above and below the diaphragm. 5. Homogeneously enlarged thyroid gland, nonspecific. Consider further dedicated evaluation with nonemergent thyroid ultrasound. 6. Mild bronchial wall thickening with bibasilar atelectasis. This could reflect a nonspecific bronchitis.   03/22/2022 Imaging   MRI liver w wo contrast 1. Bilobar hepatic lesions demonstrate irregular contour with minimally increased T2 signal, hypointense intrinsic T1 signal, postcontrast enhancement that is slowly increases and is most prominent in intensities 5 minutes postcontrast administration as well as a peripheral rim of reduced diffusivity.  2. Enlarged upper abdominal lymph nodes, likely reflect metastatic nodal disease involvement. 3. Enhancing lesions in the right tenth rib and L2 vertebral body,concerning for osseous metastatic disease. 4. Prominent subcentimeter pericardiophrenic and internal mammary lymph nodes also suspicious for nodal metastatic disease.    03/31/2022 Cancer Staging   Staging form: Hodgkin and Non-Hodgkin Lymphoma, AJCC 8th Edition - Clinical stage from 03/31/2022: Stage IV (Diffuse large B-cell lymphoma) - Signed by [name], MD on 04/19/2022 Stage prefix: Initial diagnosis International Prognostic Index (IPI) score: Score 4 International Prognostic Index  risk group: High risk DLBCL cell of origin (COO): Activated B-cell   04/03/2022 Initial Diagnosis   Large cell lymphoma ([place])  04/03/2022, ultrasound-guided liver biopsy showed large B-cell lymphoma.  Additional IHC is pending for subclassification as well as ancillary FISH testing for rearrangementds   04/16/2022 Imaging   PET initial staging showed 1. Multiple bulky intensely hypermetabolic liver masses throughout both liver lobes, compatible with reported lymphoma. Deauville score 5. 2. Mild splenomegaly with mild splenic hypermetabolism, compatible with lymphoma. No enlarged or hypermetabolic lymph nodes. No hypermetabolic skeletal activity. 3. 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 4. Chronic findings include: Aortic Atherosclerosis.Mild sigmoid diverticulosis.     04/20/2022 Procedure   Medi port placed by IR   04/22/2022 -  Chemotherapy   Patient is on Treatment Plan : [place] CHP q21d       INTERVAL HISTORY Joe Adams is a 61 y.o. male who has above history reviewed by me today presents for follow up visit for large B-cell lymphoma.   Status post cycle 1 R- Pola CHP treatments.  Overall he tolerates well.   Pain is better, he has gained weight.   MEDICAL HISTORY:  Past Medical History:  Diagnosis Date   Acute MI ([place]) 2005   Diabetes mellitus without complication (HCC)    GERD (gastroesophageal reflux disease)    Headache  Hernia, umbilical    History of kidney stones    Large cell lymphoma ([place]) 04/03/2022   Thyroid disease     SURGICAL HISTORY: Past Surgical History:  Procedure Laterality Date   COLONOSCOPY N/A 11/18/2016   Procedure: COLONOSCOPY IN O.R;  Surgeon: Christene Lye, MD;  Location: [facility] ORS;  Service: General;  Laterality: N/A;   COLONOSCOPY WITH PROPOFOL N/A 11/18/2016   Procedure: COLONOSCOPY WITH PROPOFOL;  Surgeon: Christene Lye, MD;   Location: [facility] ENDOSCOPY;  Service: Endoscopy;  Laterality: N/A;   HEMORRHOID SURGERY N/A 11/18/2016   Procedure: HEMORRHOIDECTOMY;  Surgeon: Christene Lye, MD;  Location: [facility] ORS;  Service: General;  Laterality: N/A;   IR IMAGING GUIDED PORT INSERTION  04/20/2022   TONSILLECTOMY     as a child    SOCIAL HISTORY: Social History   Socioeconomic History   Marital status: Legally Separated    Spouse name: Not on file   Number of children: Not on file   Years of education: Not on file   Highest education level: Not on file  Occupational History   Not on file  Tobacco Use   Smoking status: Never   Smokeless tobacco: Never  Vaping Use   Vaping Use: Never used  Substance and Sexual Activity   Alcohol use: Not Currently    Alcohol/week: 1.0 standard drink of alcohol    Types: 1 Shots of liquor per week    Comment: occ   Drug use: No   Sexual activity: Not on file  Other Topics Concern   Not on file  Social History Narrative   Not on file   Social Determinants of Health   Financial Resource Strain: Low Risk  (04/15/2022)   Overall Financial Resource Strain (CARDIA)    Difficulty of Paying Living Expenses: Not very hard  Recent Concern: Financial Resource Strain - Medium Risk (04/15/2022)   Overall Financial Resource Strain (CARDIA)    Difficulty of Paying Living Expenses: Somewhat hard  Food Insecurity: No Food Insecurity (04/15/2022)   Hunger Vital Sign    Worried About [place] in the Last Year: Never true    Ran Out of Food in the Last Year: Never true  Transportation Needs: No Transportation Needs (04/15/2022)   PRAPARE - Hydrologist (Medical): No    Lack of Transportation (Non-Medical): No  Physical Activity: Inactive (04/15/2022)   Exercise Vital Sign    Days of Exercise per Week: 0 days    Minutes of Exercise per Session: 0 min  Stress: Stress Concern Present (04/15/2022)   [place] [facility] [place]    Feeling of Stress : To some extent  Social Connections: Moderately Integrated (04/15/2022)   Social Connection and Isolation Panel [NHANES]    Frequency of Communication with Friends and Family: Three times a week    Frequency of Social Gatherings with Friends and Family: Twice a week    Attends Religious Services: 1 to 4 times per year    Active Member of [employer] or Organizations: No    Attends Archivist Meetings: Never    Marital Status: Living with partner  Intimate Partner Violence: Not At Risk (04/15/2022)   Humiliation, Afraid, Rape, and Kick questionnaire    Fear of Current or Ex-Partner: No    Emotionally Abused: No    Physically Abused: No    Sexually Abused: No    FAMILY HISTORY: Family History  Problem Relation  Age of Onset   Cancer Other    Cancer Paternal Uncle     ALLERGIES:  has No Known Allergies.  MEDICATIONS:  Current Outpatient Medications  Medication Sig Dispense Refill   acyclovir (ZOVIRAX) 400 MG tablet Take 1 tablet (400 mg total) by mouth daily. 30 tablet 3   allopurinol (ZYLOPRIM) 100 MG tablet Take 1 tablet (100 mg total) by mouth daily. 30 tablet 1   Docusate Calcium (STOOL SOFTENER PO) Take by mouth.     HYDROcodone-acetaminophen (NORCO/VICODIN) 5-325 MG tablet Take 1 tablet by mouth every 6 (six) hours as needed.     lactulose (CHRONULAC) 10 GM/15ML solution Take 15 mLs (10 g total) by mouth daily as needed for mild constipation. 236 mL 0   levothyroxine (SYNTHROID) 150 MCG tablet Take 150 mcg by mouth daily.     lidocaine-prilocaine (EMLA) cream Apply to affected area once 30 g 3   loratadine (CLARITIN) 10 MG tablet Take 10 mg by mouth daily.     metFORMIN (GLUCOPHAGE) 1000 MG tablet 2 (two) times daily.     oxyCODONE-acetaminophen (PERCOCET) 5-325 MG tablet Take 1 tablet by mouth every 6 (six) hours as needed for severe pain. 60 tablet 0   predniSONE (DELTASONE) 20 MG tablet Take 5 tablets (100 mg  total) by mouth daily. Take with food on days 2-5 of chemotherapy. 20 tablet 5   ciprofloxacin (CIPRO) 500 MG tablet Take 1 tablet (500 mg total) by mouth 2 (two) times daily. (Patient not taking: Reported on 05/18/2022) 10 tablet 0   ondansetron (ZOFRAN) 8 MG tablet Take 1 tablet (8 mg total) by mouth every 8 (eight) hours as needed for nausea or vomiting. Start on the third day after cyclophosphamide chemotherapy. (Patient not taking: Reported on 04/28/2022) 30 tablet 1   prochlorperazine (COMPAZINE) 10 MG tablet Take 1 tablet (10 mg total) by mouth every 6 (six) hours as needed for nausea or vomiting. (Patient not taking: Reported on 04/28/2022) 30 tablet 6   No current facility-administered medications for this visit.   Facility-Administered Medications Ordered in Other Visits  Medication Dose Route Frequency Provider Last Rate Last Admin   heparin lock flush 100 unit/mL  500 Units Intracatheter Once PRN [name], MD        Review of Systems  Constitutional:  Positive for fatigue. Negative for appetite change, chills, fever and unexpected weight change.  HENT:   Negative for voice change.        Hair loss  Eyes:  Negative for eye problems and icterus.  Respiratory:  Negative for chest tightness, cough and shortness of breath.   Cardiovascular:  Negative for chest pain and leg swelling.  Gastrointestinal:  Negative for abdominal distention and abdominal pain.  Endocrine: Negative for hot flashes.  Genitourinary:  Negative for difficulty urinating, dysuria and frequency.   Musculoskeletal:  Negative for arthralgias.  Skin:  Negative for itching and rash.  Neurological:  Negative for light-headedness and numbness.  Hematological:  Negative for adenopathy. Does not bruise/bleed easily.  Psychiatric/Behavioral:  Negative for confusion.    PHYSICAL EXAMINATION: ECOG PERFORMANCE STATUS: 1 - Symptomatic but completely ambulatory Vitals:   05/18/22 0917  BP: 126/79  Pulse: 90  Temp: (!) 97  F (36.1 C)  SpO2: 96%   Filed Weights   05/18/22 0917  Weight: 175 lb 11.2 oz (79.7 kg)    Physical Exam Constitutional:      General: He is not in acute distress. HENT:     Head: Normocephalic  and atraumatic.  Eyes:     General: No scleral icterus. Cardiovascular:     Rate and Rhythm: Normal rate.  Pulmonary:     Effort: Pulmonary effort is normal. No respiratory distress.     Breath sounds: No wheezing.  Abdominal:     General: There is no distension.  Musculoskeletal:        General: No deformity. Normal range of motion.     Cervical back: Normal range of motion and neck supple.  Skin:    General: Skin is warm.     Findings: No erythema or rash.  Neurological:     Mental Status: He is alert and oriented to person, place, and time. Mental status is at baseline.     Cranial Nerves: No cranial nerve deficit.     Coordination: Coordination normal.  Psychiatric:        Mood and Affect: Mood normal.     LABORATORY DATA:  I have reviewed the data as listed    Latest Ref Rng & Units 05/18/2022    8:50 AM 05/05/2022    8:00 AM 04/29/2022    3:24 PM  CBC  WBC 4.0 - 10.5 K/uL 9.0  22.1  0.9   Hemoglobin 13.0 - 17.0 g/dL 12.8  11.6  11.6   Hematocrit 39.0 - 52.0 % 37.4  35.1  35.3   Platelets 150 - 400 K/uL 267  109  153       Latest Ref Rng & Units 05/18/2022    8:50 AM 04/29/2022    3:24 PM 04/28/2022    8:33 AM  CMP  Glucose 70 - 99 mg/dL 136  231  157   BUN 8 - 23 mg/dL _0 Creatinine 0.61 - 1.24 mg/dL 0.75  0.74  0.57   Sodium 135 - 145 mmol/L 133  130  134   Potassium 3.5 - 5.1 mmol/L 4.4  4.4  4.1   Chloride 98 - 111 mmol/L 106  96  96   CO2 22 - 32 mmol/L _1 Calcium 8.9 - 10.3 mg/dL 9.1  9.6  9.4   Total Protein 6.5 - 8.1 g/dL 7.2  6.8  6.7   Total Bilirubin 0.3 - 1.2 mg/dL 0.4  1.0  0.8   Alkaline Phos 38 - 126 U/L 138  238  195   AST 15 - 41 U/L 25  79  69   ALT 0 - 44 U/L 23  137  127       RADIOGRAPHIC STUDIES: I have  personally reviewed the radiological images as listed and agreed with the findings in the report. US BIOPSY (LIVER)  Result Date: 03/31/2022 INDICATION: 61 year old male with multifocal liver masses of indeterminate etiology. EXAM: ULTRASOUND GUIDED LIVER LESION BIOPSY COMPARISON:  None Available. MEDICATIONS: None ANESTHESIA/SEDATION: Fentanyl 50 mcg IV; Versed 1 mg IV Total Moderate Sedation time:  10 minutes. The patient's level of consciousness and vital signs were monitored continuously by radiology nursing throughout the procedure under my direct supervision. COMPLICATIONS: None immediate. PROCEDURE: Informed written consent was obtained from the patient after a discussion of the risks, benefits and alternatives to treatment. The patient understands and consents the procedure. A timeout was performed prior to the initiation of the procedure. Ultrasound scanning was performed of the right upper abdominal quadrant demonstrates multifocal heterogeneously solid masses throughout the liver. A subcapsular right lobe mass was selected for biopsy and the procedure was planned. The  right upper abdominal quadrant was prepped and draped in the usual sterile fashion. The overlying soft tissues were anesthetized with 1% lidocaine with epinephrine. A 17 gauge, 6.8 cm co-axial needle was advanced into a peripheral aspect of the lesion. This was followed by 4 core biopsies with an 18 gauge core device under direct ultrasound guidance. The coaxial needle tract was embolized with a small amount of Gel-Foam slurry and superficial hemostasis was obtained with manual compression. Post procedural scanning was negative for definitive area of hemorrhage or additional complication. A dressing was placed. The patient tolerated the procedure well without immediate post procedural complication. IMPRESSION: Technically successful ultrasound guided core needle biopsy of right lobe liver mass. Ruthann Cancer, MD [facility] Rad[facility] Electronically Signed   By: Ruthann Cancer M.D.   On: 03/31/2022 10:49   MR LIVER W WO CONTRAST  Result Date: 03/22/2022 CLINICAL DATA:  Further evaluation of hepatic lesions seen on prior CT. EXAM: MRI ABDOMEN WITHOUT AND WITH CONTRAST TECHNIQUE: Multiplanar multisequence MR imaging of the abdomen was performed both before and after the administration of intravenous contrast. CONTRAST:  7cc of Vue[place] COMPARISON:  Multiple priors including most recent CT chest abdomen pelvis dated March 22, 2022. FINDINGS: Lower chest: No acute abnormality. Hepatobiliary: Bilobar hepatic lesions demonstrate irregular contour with minimally increased T2 signal and hypointense intrinsic T1 signal demonstrating postcontrast enhancement which slowly increases over progressive postcontrast pulse sequences most dominant on the 5 minute delayed and with a peripheral rim of reduced diffusivity. For reference: -lesion in the central right lobe of the liver measures 9.2 x 7.2 cm on image 49/13 -segment IVa/VIII hepatic lesion measures 7.9 x 5.4 cm on image 31/13 -segment II hepatic lesion measures 3.4 x 2.7 cm on image 21/13. No evidence of portal venous involvement. Gallbladder is unremarkable.  No biliary ductal dilation. Pancreas: No pancreatic ductal dilation or evidence of acute inflammation. No suspicious pancreatic mass identified. Spleen:  No splenomegaly or focal splenic lesion. Adrenals/Urinary Tract: Bilateral adrenal glands are within normal limits. No hydronephrosis. No solid enhancing renal mass. Stomach/Bowel: Visualized portions within the abdomen are unremarkable. Vascular/Lymphatic: Normal caliber abdominal aorta. The portal, splenic and superior mesenteric veins are patent. Enlarged retroperitoneal, portacaval, periportal, and gastrohepatic ligament lymph nodes. For reference: -portacaval lymph node measures 13 mm in short axis on image 49/16. -periportal lymph  node measures 10 mm in short axis on image 44/16. Prominent subcentimeter pericardiophrenic and internal mammary lymph nodes. For reference: -A lymph node along the inferior aspect of the internal mammary artery on the right below the xiphoid process measures 8 mm in short axis on image 18/22 -A pericardiophrenic lymph node measures 7 mm in short axis on image 88/22. Other:  No significant abdominopelvic free fluid. Musculoskeletal: Enhancing lesion in the right tenth rib on image 38/22. Enhancing lesion in the L2 vertebral body on image 56/22. IMPRESSION: 1. Bilobar hepatic lesions demonstrate irregular contour with minimally increased T2 signal, hypointense intrinsic T1 signal, postcontrast enhancement that is slowly increases and is most prominent in intensities 5 minutes postcontrast administration as well as a peripheral rim of reduced diffusivity. 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 With alternate differential consideration of atypical hepatocellular carcinoma. Recommend oncology referral with multidisciplinary tumor board consultation and definitive diagnosis with direct tissue sampling. 2. Enlarged upper abdominal lymph nodes, likely reflect metastatic nodal disease involvement. 3. Enhancing lesions in the right tenth rib and L2 vertebral body, concerning for osseous metastatic disease. 4. Prominent  subcentimeter pericardiophrenic and internal mammary lymph nodes also suspicious for nodal metastatic disease. Electronically Signed   By: Dahlia Bailiff M.D.   On: 03/22/2022 19:52   CT Angio Chest PE W/Cm &/Or Wo Cm  Result Date: 03/22/2022 CLINICAL DATA:  Abdominal pain, acute, nonlocalized; Pulmonary embolism (PE) suspected, high prob EXAM: CT ANGIOGRAPHY CHEST CT ABDOMEN AND PELVIS WITH CONTRAST TECHNIQUE: Multidetector CT imaging of the chest was performed using the standard protocol during bolus administration of intravenous contrast. Multiplanar CT image  reconstructions and MIPs were obtained to evaluate the vascular anatomy. Multidetector CT imaging of the abdomen and pelvis was performed using the standard protocol during bolus administration of intravenous contrast. body onc RADIATION DOSE REDUCTION: This exam was performed according to the departmental dose-optimization program which includes automated exposure control, adjustment of the mA and/or kV according to patient size and/or use of iterative reconstruction technique. CONTRAST:  3m OMNIPAQUE IOHEXOL 350 MG/ML SOLN COMPARISON:  Oct 31, 2014 FINDINGS: CTA CHEST FINDINGS Cardiovascular: Satisfactory opacification of the pulmonary arteries to the segmental level. No evidence of pulmonary embolism. Mildly enlarged heart size. No pericardial effusion. Mediastinum/Nodes: Homogeneously enlarged thyroid gland. There are multiple borderline enlarged mediastinal lymph nodes. RIGHT paratracheal lymph node measures 11 mm in the short axis (series 507, image 55). No axillary adenopathy or hilar adenopathy. Lungs/Pleura: No pleural effusion or pneumothorax. Mild bronchial wall thickening with scattered areas of endobronchial debris. Bibasilar atelectasis. Musculoskeletal: No aggressive osseous abnormality. Review of the MIP images confirms the above findings. CT ABDOMEN and PELVIS FINDINGS Hepatobiliary: There are multiple heterogeneously enhancing masses throughout the liver. Representative mass within hepatic segment 5/6 spans approximately 8.6 cm (series 11, image 33). Representative heterogeneously enhancing mass spanning hepatic segments 6/7 with associated capsular retraction spans at least 11.8 cm (series 14, image 70). Gallbladder is unremarkable. Portal vein is patent. Pancreas: Unremarkable. No pancreatic ductal dilatation or surrounding inflammatory changes. Spleen: Splenomegaly. Adrenals/Urinary Tract: Adrenal glands are unremarkable. Kidneys are normal, without renal calculi, focal lesion, or  hydronephrosis. Bladder is unremarkable. Stomach/Bowel: Stomach is within normal limits. Appendix appears normal. No evidence of bowel wall thickening, distention, or inflammatory changes. Vascular/Lymphatic: Abdominal aorta is normal in course and caliber. Mildly prominent porta hepatic lymph node measures 15 mm in the short axis (series 11, image 28). Prominent LEFT periaortic lymph node measures 9 mm in the short axis (series 11, image 38). Reproductive: Coarse calcifications of the prostate. Other: No free air or free fluid. Musculoskeletal: No aggressive osseous lesions. Limbus vertebra of L5. Review of the MIP images confirms the above findings. IMPRESSION: 1. No acute pulmonary embolism. 2. There are multiple heterogeneously enhancing masses throughout the liver, several which demonstrate capsular retraction. No definitive extrahepatic primary is identified. Findings could reflect multifocal hepatocellular carcinoma/cholangiocarcinoma, lymphoma or abscess. Recommend correlation with blood chemistry values. Liver MRI with and without contrast could be of assistance in further characterizing these masses. 3. Splenomegaly. 4. Multiple borderline enlarged lymph nodes above and below the diaphragm. 5. Homogeneously enlarged thyroid gland, nonspecific. Consider further dedicated evaluation with nonemergent thyroid ultrasound. 6. Mild bronchial wall thickening with bibasilar atelectasis. This could reflect a nonspecific bronchitis. These results were called by telephone at the time of interpretation on 03/22/2022 at 4:39 pm to provider Dr. GArchie Balboa who verbally acknowledged these results. Electronically Signed   By: SValentino SaxonM.D.   On: 03/22/2022 16:47   CT Abdomen Pelvis W Contrast  Result Date: 03/22/2022 CLINICAL DATA:  Abdominal pain, acute, nonlocalized; Pulmonary embolism (PE) suspected, high  prob EXAM: CT ANGIOGRAPHY CHEST CT ABDOMEN AND PELVIS WITH CONTRAST TECHNIQUE: Multidetector CT imaging of  the chest was performed using the standard protocol during bolus administration of intravenous contrast. Multiplanar CT image reconstructions and MIPs were obtained to evaluate the vascular anatomy. Multidetector CT imaging of the abdomen and pelvis was performed using the standard protocol during bolus administration of intravenous contrast. body onc RADIATION DOSE REDUCTION: This exam was performed according to the departmental dose-optimization program which includes automated exposure control, adjustment of the mA and/or kV according to patient size and/or use of iterative reconstruction technique. CONTRAST:  64m OMNIPAQUE IOHEXOL 350 MG/ML SOLN COMPARISON:  Oct 31, 2014 FINDINGS: CTA CHEST FINDINGS Cardiovascular: Satisfactory opacification of the pulmonary arteries to the segmental level. No evidence of pulmonary embolism. Mildly enlarged heart size. No pericardial effusion. Mediastinum/Nodes: Homogeneously enlarged thyroid gland. There are multiple borderline enlarged mediastinal lymph nodes. RIGHT paratracheal lymph node measures 11 mm in the short axis (series 507, image 55). No axillary adenopathy or hilar adenopathy. Lungs/Pleura: No pleural effusion or pneumothorax. Mild bronchial wall thickening with scattered areas of endobronchial debris. Bibasilar atelectasis. Musculoskeletal: No aggressive osseous abnormality. Review of the MIP images confirms the above findings. CT ABDOMEN and PELVIS FINDINGS Hepatobiliary: There are multiple heterogeneously enhancing masses throughout the liver. Representative mass within hepatic segment 5/6 spans approximately 8.6 cm (series 11, image 33). Representative heterogeneously enhancing mass spanning hepatic segments 6/7 with associated capsular retraction spans at least 11.8 cm (series 14, image 70). Gallbladder is unremarkable. Portal vein is patent. Pancreas: Unremarkable. No pancreatic ductal dilatation or surrounding inflammatory changes. Spleen: Splenomegaly.  Adrenals/Urinary Tract: Adrenal glands are unremarkable. Kidneys are normal, without renal calculi, focal lesion, or hydronephrosis. Bladder is unremarkable. Stomach/Bowel: Stomach is within normal limits. Appendix appears normal. No evidence of bowel wall thickening, distention, or inflammatory changes. Vascular/Lymphatic: Abdominal aorta is normal in course and caliber. Mildly prominent porta hepatic lymph node measures 15 mm in the short axis (series 11, image 28). Prominent LEFT periaortic lymph node measures 9 mm in the short axis (series 11, image 38). Reproductive: Coarse calcifications of the prostate. Other: No free air or free fluid. Musculoskeletal: No aggressive osseous lesions. Limbus vertebra of L5. Review of the MIP images confirms the above findings. IMPRESSION: 1. No acute pulmonary embolism. 2. There are multiple heterogeneously enhancing masses throughout the liver, several which demonstrate capsular retraction. No definitive extrahepatic primary is identified. Findings could reflect multifocal hepatocellular carcinoma/cholangiocarcinoma, lymphoma or abscess. Recommend correlation with blood chemistry values. Liver MRI with and without contrast could be of assistance in further characterizing these masses. 3. Splenomegaly. 4. Multiple borderline enlarged lymph nodes above and below the diaphragm. 5. Homogeneously enlarged thyroid gland, nonspecific. Consider further dedicated evaluation with nonemergent thyroid ultrasound. 6. Mild bronchial wall thickening with bibasilar atelectasis. This could reflect a nonspecific bronchitis. These results were called by telephone at the time of interpretation on 03/22/2022 at 4:39 pm to provider Dr. GArchie Balboa who verbally acknowledged these results. Electronically Signed   By: SValentino SaxonM.D.   On: 03/22/2022 16:47   CT Head Wo Contrast  Result Date: 03/22/2022 CLINICAL DATA:  Dizziness, persistent/recurrent, cardiac or vascular cause suspected EXAM:  CT HEAD WITHOUT CONTRAST TECHNIQUE: Contiguous axial images were obtained from the base of the skull through the vertex without intravenous contrast. RADIATION DOSE REDUCTION: This exam was performed according to the departmental dose-optimization program which includes automated exposure control, adjustment of the mA and/or kV according to patient size and/or use of iterative  reconstruction technique. COMPARISON:  Nov 17, 2004 FINDINGS: Brain: No evidence of acute infarction, hemorrhage, hydrocephalus, extra-axial collection or mass lesion/mass effect. Periventricular white matter hypodensities consistent with sequela of chronic microvascular ischemic disease. Vascular: No hyperdense vessel or unexpected calcification. Skull: No acute fracture. 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 Sinuses/Orbits: No acute finding. Other: None. IMPRESSION: No acute intracranial abnormality. Electronically Signed   By: Valentino Saxon M.D.   On: 03/22/2022 16:14   DG Chest 1 View  Result Date: 03/22/2022 CLINICAL DATA:  Dizziness and right-sided pain at night. Intermittent fevers and unexpected weight loss. EXAM: CHEST  1 VIEW COMPARISON:  Chest radiograph 12/29/2010, 12/13/2007 FINDINGS: Borderline enlargement of the cardiac silhouette, which could exaggerated secondary to portable technique. Subtle interstitial thickening in the lower lungs. No pleural effusion or pneumothorax. Visualized osseous structures are unremarkable. IMPRESSION: Subtle interstitial thickening in the lower lungs may represent subsegmental atelectasis or possibly mild pulmonary edema. No lobar consolidation or pleural effusion. Electronically Signed   By: Ileana Roup M.D.   On: 03/22/2022 13:51

## 2022-05-18 NOTE — Progress Notes (Addendum)
Rapid Infusion Rituximab Pharmacist Evaluation  Joe Adams is a 61 y.o. 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 This patient may not be considered for RIR.   A pharmacist has verified the patient tolerated rituximab infusions per the [facility] standard infusion protocol without grade 3-4 infusion reactions. The treatment plan will be updated to reflect RIR if the patient qualifies per the checklist below:   Age > 73 years old Yes   Clinically significant cardiovascular disease No   Circulating lymphocyte count < 5000/uL prior to cycle two Yes  Lab Results  Component Value Date   LYMPHSABS 1.2 05/18/2022    Prior documented grade 3-4 infusion reaction to rituximab MD wants to run as long infusion   Prior documented grade 1-2 infusion reaction to rituximab (If YES, Pharmacist will confirm with Physician if patient is still a candidate for RIR) No   Previous rituximab infusion within the past 6 months Yes   Treatment Plan updated orders to reflect RIR No    Joe Adams does not meet the criteria for Rapid Infusion Rituximab. This patient is not going to be switched to rapid infusion rituximab.   Joe Adams 05/18/22 10:19 AM

## 2022-05-18 NOTE — Assessment & Plan Note (Signed)
oxycodone PRN as directed. Pain has improved.

## 2022-05-18 NOTE — Assessment & Plan Note (Signed)
Treatment plan as listed above. 

## 2022-05-19 ENCOUNTER — Inpatient Hospital Stay: Payer: 59

## 2022-05-19 VITALS — BP 115/85 | HR 73 | Temp 97.7°F | Resp 16

## 2022-05-19 DIAGNOSIS — Z5111 Encounter for antineoplastic chemotherapy: Secondary | ICD-10-CM | POA: Diagnosis not present

## 2022-05-19 DIAGNOSIS — C8338 Diffuse large B-cell lymphoma, lymph nodes of multiple sites: Secondary | ICD-10-CM

## 2022-05-19 MED ORDER — SODIUM CHLORIDE 0.9 % IV SOLN
Freq: Once | INTRAVENOUS | Status: AC
  Filled 2022-05-19: qty 250

## 2022-05-19 MED ORDER — SODIUM CHLORIDE 0.9 % IV SOLN
150.0000 mg | Freq: Once | INTRAVENOUS | Status: AC
  Administered 2022-05-19: 150 mg via INTRAVENOUS
  Filled 2022-05-19: qty 150

## 2022-05-19 MED ORDER — DOXORUBICIN HCL CHEMO IV INJECTION 2 MG/ML
50.0000 mg/m2 | Freq: Once | INTRAVENOUS | Status: AC
  Administered 2022-05-19: 94 mg via INTRAVENOUS
  Filled 2022-05-19: qty 47

## 2022-05-19 MED ORDER — ACETAMINOPHEN 325 MG PO TABS
650.0000 mg | ORAL_TABLET | Freq: Once | ORAL | Status: AC
  Administered 2022-05-19: 650 mg via ORAL
  Filled 2022-05-19: qty 2

## 2022-05-19 MED ORDER — HEPARIN SOD (PORK) LOCK FLUSH 100 UNIT/ML IV SOLN
500.0000 [IU] | Freq: Once | INTRAVENOUS | Status: AC | PRN
  Administered 2022-05-19: 500 [IU]
  Filled 2022-05-19: qty 5

## 2022-05-19 MED ORDER — SODIUM CHLORIDE 0.9 % IV SOLN
15.0000 mg | Freq: Once | INTRAVENOUS | Status: AC
  Administered 2022-05-19: 15 mg via INTRAVENOUS
  Filled 2022-05-19: qty 1.5

## 2022-05-19 MED ORDER — SODIUM CHLORIDE 0.9% FLUSH
10.0000 mL | INTRAVENOUS | Status: DC | PRN
  Filled 2022-05-19: qty 10

## 2022-05-19 MED ORDER — DIPHENHYDRAMINE HCL 25 MG PO CAPS
50.0000 mg | ORAL_CAPSULE | Freq: Once | ORAL | Status: AC
  Administered 2022-05-19: 50 mg via ORAL
  Filled 2022-05-19: qty 2

## 2022-05-19 MED ORDER — SODIUM CHLORIDE 0.9 % IV SOLN
750.0000 mg/m2 | Freq: Once | INTRAVENOUS | Status: AC
  Administered 2022-05-19: 1400 mg via INTRAVENOUS
  Filled 2022-05-19: qty 70

## 2022-05-19 MED ORDER — PALONOSETRON HCL INJECTION 0.25 MG/5ML
0.2500 mg | Freq: Once | INTRAVENOUS | Status: AC
  Administered 2022-05-19: 0.25 mg via INTRAVENOUS
  Filled 2022-05-19: qty 5

## 2022-05-19 MED ORDER — SODIUM CHLORIDE 0.9 % IV SOLN
1.8000 mg/kg | Freq: Once | INTRAVENOUS | Status: AC
  Administered 2022-05-19: 140 mg via INTRAVENOUS
  Filled 2022-05-19: qty 7

## 2022-05-19 NOTE — Progress Notes (Signed)
Nutrition Follow-up:   Patient with large b cell lymphoma.  Patient receiving R-POLA CHP.    Met with patient and brother during infusion.  Patient reports good appetite.  After last treatment had slight metallic taste but did not effect intake.  Using plastic utensils and helping.  No other taste alterations.  Eating 3 + meals a day.  Patient and family is preparing meals.  Breakfast is eggs, sausage, liver pudding.  Lunch is pork chops, beans.  Supper is similar to lunch.  "I eat all the time."  Denies any nutrition impact symptoms    Medications: reviewed  Labs: reviewed  Anthropometrics:   Weight 175 lb 11.2 oz  169 lb 14.4 oz on 11/7   NUTRITION DIAGNOSIS: Unintentional weight loss improved    INTERVENTION:  Continue well balanced diet including good sources of protein to prevent weight loss.  Patient has RD contact information    MONITORING, EVALUATION, GOAL: weight trends, intake   NEXT VISIT: as needed  Emanuell Morina B. Zenia Resides, [place], [place] Registered Dietitian [phone]

## 2022-05-19 NOTE — Patient Instructions (Signed)
[facility] AT [place]  Discharge Instructions: Thank you for choosing [facility] to provide your oncology and hematology care.  If you have a lab appointment with the [facility], please go directly to the [facility] and check in at the registration area.  Wear comfortable clothing and clothing appropriate for easy access to any Portacath or PICC line.   We strive to give you quality time with your provider. You may need to reschedule your appointment if you arrive late (15 or more minutes).  Arriving late affects you and other patients whose appointments are after yours.  Also, if you miss three or more appointments without notifying the office, you may be dismissed from the clinic at the provider's discretion.      For prescription refill requests, have your pharmacy contact our office and allow 72 hours for refills to be completed.    Today you received the following chemotherapy and/or immunotherapy agent- polivi, adriamycin, cytoxan      To help prevent nausea and vomiting after your treatment, we encourage you to take your nausea medication as directed.  BELOW ARE SYMPTOMS THAT SHOULD BE REPORTED IMMEDIATELY: *FEVER GREATER THAN 100.4 F (38 C) OR HIGHER *CHILLS OR SWEATING *NAUSEA AND VOMITING THAT IS NOT CONTROLLED WITH YOUR NAUSEA MEDICATION *UNUSUAL SHORTNESS OF BREATH *UNUSUAL BRUISING OR BLEEDING *URINARY PROBLEMS (pain or burning when urinating, or frequent urination) *BOWEL PROBLEMS (unusual diarrhea, constipation, pain near the anus) TENDERNESS IN MOUTH AND THROAT WITH OR WITHOUT PRESENCE OF ULCERS (sore throat, sores in mouth, or a toothache) UNUSUAL RASH, SWELLING OR PAIN  UNUSUAL VAGINAL DISCHARGE OR ITCHING   Items with * indicate a potential emergency and should be followed up as soon as possible or go to the Emergency Department if any problems should occur.  Please show the CHEMOTHERAPY ALERT CARD or IMMUNOTHERAPY ALERT  CARD at check-in to the Emergency Department and triage nurse.  Should you have questions after your visit or need to cancel or reschedule your appointment, please contact [facility] CANCER [place] AT [place]  [phone] and follow the prompts.  Office hours are 8:00 a.m. to 4:30 p.m. Monday - Friday. Please note that voicemails left after 4:00 p.m. may not be returned until the following business day.  We are closed weekends and major holidays. You have access to a nurse at all times for urgent questions. Please call the main number to the clinic [phone] and follow the prompts.  For any non-urgent questions, you may also contact your provider using MyChart. We now offer e-Visits for anyone 23 and older to request care online for non-urgent symptoms. For details visit [email or web address].   Also download the MyChart app! Go to the app store, search "MyChart", open the app, select [facility], and log in with your MyChart username and password.  Masks are optional in the cancer centers. If you would like for your care team to wear a mask while they are taking care of you, please let them know. For doctor visits, patients may have with them one support person who is at least 61 years old. At this time, visitors are not allowed in the infusion area.

## 2022-05-21 ENCOUNTER — Ambulatory Visit
Admission: RE | Admit: 2022-05-21 | Discharge: 2022-05-21 | Disposition: A | Discharge status: Home / Self Care | Payer: 59 | Source: Ambulatory Visit | Attending: Oncology | Admitting: Oncology

## 2022-05-21 ENCOUNTER — Inpatient Hospital Stay: Payer: 59

## 2022-05-21 ENCOUNTER — Other Ambulatory Visit: Payer: Self-pay

## 2022-05-21 VITALS — BP 122/86 | HR 88

## 2022-05-21 DIAGNOSIS — C8338 Diffuse large B-cell lymphoma, lymph nodes of multiple sites: Secondary | ICD-10-CM

## 2022-05-21 DIAGNOSIS — Z5111 Encounter for antineoplastic chemotherapy: Secondary | ICD-10-CM | POA: Diagnosis not present

## 2022-05-21 LAB — CSF CELL COUNT WITH DIFFERENTIAL
RBC Count, CSF: 6 /mm3 — ABNORMAL HIGH (ref 0–3)
Tube #: 3
WBC, CSF: 0 /mm3 (ref 0–5)

## 2022-05-21 MED ORDER — LIDOCAINE HCL (PF) 1 % IJ SOLN
10.0000 mL | Freq: Once | INTRAMUSCULAR | Status: AC
  Administered 2022-05-21: 9 mL
  Filled 2022-05-21: qty 10

## 2022-05-21 MED ORDER — PEGFILGRASTIM INJECTION 6 MG/0.6ML ~~LOC~~
6.0000 mg | PREFILLED_SYRINGE | Freq: Once | SUBCUTANEOUS | Status: AC
  Administered 2022-05-21: 6 mg via SUBCUTANEOUS
  Filled 2022-05-21: qty 0.6

## 2022-05-21 MED ORDER — SODIUM CHLORIDE (PF) 0.9 % IJ SOLN
Freq: Once | INTRAMUSCULAR | Status: AC
  Filled 2022-05-21: qty 0.48

## 2022-05-21 MED ORDER — ALLOPURINOL 100 MG PO TABS: 100.0000 mg | ORAL_TABLET | Freq: Every day | ORAL | 1 refills | Status: DC

## 2022-05-21 MED ORDER — ACETAMINOPHEN 325 MG PO TABS: 650.0000 mg | ORAL_TABLET | ORAL | Status: DC | PRN

## 2022-05-21 NOTE — Progress Notes (Signed)
1300 - Patient discharged from 3B (post LP) and taken via wheelchair to cancer center for injection.

## 2022-05-21 NOTE — Discharge Instructions (Signed)
Lumbar Puncture, Care After Refer to this sheet in the next few weeks. These instructions provide you with information on caring for yourself after your procedure. Your health care provider may also give you more specific instructions. Your treatment has been planned according to current medical practices, but problems sometimes occur. Call your health care provider if you have any problems or questions after your procedure. What can I expect after the procedure? After your procedure, it is typical to have the following sensations: Mild discomfort or pain at the insertion site. Mild headache that is relieved with pain medicines.  Follow these instructions at home:  Avoid lifting anything heavier than 10 lb (4.5 kg) for at least 12 hours after the procedure. Drink enough fluids to keep your urine clear or pale yellow. Lay flat or as flat as possible for the remainder of the day. Contact a health care provider if: You have fever or chills. You have nausea or vomiting. You have a headache that lasts for more than 2 days. Get help right away if: You have any numbness or tingling in your legs. You are unable to control your bowel or bladder. You have bleeding or swelling in your back at the insertion site. You are dizzy or faint. This information is not intended to replace advice given to you by your health care provider. Make sure you discuss any questions you have with your health care provider. Document Released: 06/13/2013 Document Revised: 11/14/2015 Document Reviewed: 02/14/2013 Elsevier Interactive Patient Education  2017 [employer] 

## 2022-05-25 ENCOUNTER — Encounter: Payer: Self-pay | Admitting: Nurse Practitioner

## 2022-05-25 ENCOUNTER — Inpatient Hospital Stay: Payer: 59

## 2022-05-25 ENCOUNTER — Inpatient Hospital Stay (HOSPITAL_BASED_OUTPATIENT_CLINIC_OR_DEPARTMENT_OTHER): Payer: 59 | Admitting: Nurse Practitioner

## 2022-05-25 ENCOUNTER — Other Ambulatory Visit: Payer: Self-pay

## 2022-05-25 ENCOUNTER — Inpatient Hospital Stay: Payer: 59 | Attending: Oncology

## 2022-05-25 VITALS — BP 132/87 | HR 81 | Temp 99.1°F | Wt 178.0 lb

## 2022-05-25 DIAGNOSIS — C8338 Diffuse large B-cell lymphoma, lymph nodes of multiple sites: Secondary | ICD-10-CM | POA: Diagnosis not present

## 2022-05-25 DIAGNOSIS — Z5111 Encounter for antineoplastic chemotherapy: Secondary | ICD-10-CM | POA: Insufficient documentation

## 2022-05-25 DIAGNOSIS — C8339 Diffuse large B-cell lymphoma, extranodal and solid organ sites: Secondary | ICD-10-CM | POA: Diagnosis present

## 2022-05-25 DIAGNOSIS — R634 Abnormal weight loss: Secondary | ICD-10-CM | POA: Insufficient documentation

## 2022-05-25 DIAGNOSIS — Z5189 Encounter for other specified aftercare: Secondary | ICD-10-CM | POA: Diagnosis not present

## 2022-05-25 DIAGNOSIS — Z5112 Encounter for antineoplastic immunotherapy: Secondary | ICD-10-CM | POA: Diagnosis present

## 2022-05-25 LAB — COMP PANEL: LEUKEMIA/LYMPHOMA

## 2022-05-25 LAB — CBC WITH DIFFERENTIAL/PLATELET
Abs Immature Granulocytes: 0.1 10*3/uL — ABNORMAL HIGH (ref 0.00–0.07)
Basophils Absolute: 0 10*3/uL (ref 0.0–0.1)
Basophils Relative: 0 %
Eosinophils Absolute: 1.9 10*3/uL — ABNORMAL HIGH (ref 0.0–0.5)
Eosinophils Relative: 13 %
HCT: 34.3 % — ABNORMAL LOW (ref 39.0–52.0)
Hemoglobin: 11.7 g/dL — ABNORMAL LOW (ref 13.0–17.0)
Lymphocytes Relative: 7 %
Lymphs Abs: 1 10*3/uL (ref 0.7–4.0)
MCH: 29 pg (ref 26.0–34.0)
MCHC: 34.1 g/dL (ref 30.0–36.0)
MCV: 84.9 fL (ref 80.0–100.0)
Monocytes Absolute: 0.6 10*3/uL (ref 0.1–1.0)
Monocytes Relative: 4 %
Myelocytes: 1 %
Neutro Abs: 11.2 10*3/uL — ABNORMAL HIGH (ref 1.7–7.7)
Neutrophils Relative %: 75 %
Platelets: 123 10*3/uL — ABNORMAL LOW (ref 150–400)
RBC: 4.04 MIL/uL — ABNORMAL LOW (ref 4.22–5.81)
RDW: 18.2 % — ABNORMAL HIGH (ref 11.5–15.5)
Smear Review: NORMAL
WBC: 14.9 10*3/uL — ABNORMAL HIGH (ref 4.0–10.5)
nRBC: 0 % (ref 0.0–0.2)

## 2022-05-25 LAB — COMPREHENSIVE METABOLIC PANEL
ALT: 29 U/L (ref 0–44)
AST: 24 U/L (ref 15–41)
Albumin: 3.7 g/dL (ref 3.5–5.0)
Alkaline Phosphatase: 92 U/L (ref 38–126)
Anion gap: 10 (ref 5–15)
BUN: 34 mg/dL — ABNORMAL HIGH (ref 8–23)
CO2: 22 mmol/L (ref 22–32)
Calcium: 8.8 mg/dL — ABNORMAL LOW (ref 8.9–10.3)
Chloride: 104 mmol/L (ref 98–111)
Creatinine, Ser: 0.7 mg/dL (ref 0.61–1.24)
GFR, Estimated: >60 mL/min (ref >60)
Glucose, Bld: 139 mg/dL — ABNORMAL HIGH (ref 70–99)
Potassium: 4 mmol/L (ref 3.5–5.1)
Sodium: 136 mmol/L (ref 135–145)
Total Bilirubin: 0.4 mg/dL (ref 0.3–1.2)
Total Protein: 6 g/dL — ABNORMAL LOW (ref 6.5–8.1)

## 2022-05-25 MED ORDER — SODIUM CHLORIDE 0.9% FLUSH
10.0000 mL | Freq: Once | INTRAVENOUS | Status: AC
  Administered 2022-05-25: 10 mL via INTRAVENOUS
  Filled 2022-05-25: qty 10

## 2022-05-25 MED ORDER — SODIUM CHLORIDE 0.9 % IV SOLN
Freq: Once | INTRAVENOUS | Status: AC
  Filled 2022-05-25: qty 250

## 2022-05-25 MED ORDER — HEPARIN SOD (PORK) LOCK FLUSH 100 UNIT/ML IV SOLN
500.0000 [IU] | Freq: Once | INTRAVENOUS | Status: AC
  Administered 2022-05-25: 500 [IU] via INTRAVENOUS
  Filled 2022-05-25: qty 5

## 2022-05-25 NOTE — Progress Notes (Signed)
Hematology/Oncology Progress Note [phone] [phone]   Patient Care Team: Ranae Plumber, [place] as PCP - General (Family Medicine) Ranae Plumber, [place] (Family Medicine) Christene Lye, MD (General Surgery) Clent Jacks, RN as Oncology Nurse Navigator  CHIEF COMPLAINTS/REASON FOR VISIT:  Stage IV DLBCL  HISTORY OF PRESENTING ILLNESS: Joe Adams is a 61 y.o.  male with PMH listed below was seen in consultation at the request of  Ranae Plumber, [place] for evaluation of liver mass.  03/22/2022, patient presented to emergency room for evaluation of right side lower chest/upper abdomen/flank pain, intermittent low-grade fever and chills, night sweats and unintentional weight loss of 20 to 30 pounds over the past 6 months.  He finished a course of antibiotics a few months ago for unknown infection and symptom has not improved.  Oncology History  Diffuse large B cell lymphoma ([place])  03/22/2022 Imaging   CT chest angiogram, CT abdomen pelvis w contrast  1. No acute pulmonary embolism. 2. There are multiple heterogeneously enhancing masses throughout the liver, several which demonstrate capsular retraction. No definitive extrahepatic primary is identified. Findings could reflect multifocal hepatocellular carcinoma/cholangiocarcinoma, lymphoma or abscess. Recommend correlation with blood chemistry values. Liver MRI with and without contrast could be of assistance in further characterizing these masses. 3. Splenomegaly.4. Multiple borderline enlarged lymph nodes above and below the diaphragm. 5. Homogeneously enlarged thyroid gland, nonspecific. Consider further dedicated evaluation with nonemergent thyroid ultrasound. 6. Mild bronchial wall thickening with bibasilar atelectasis. This could reflect a nonspecific bronchitis.   03/22/2022 Imaging   MRI liver w wo contrast 1. Bilobar hepatic lesions demonstrate irregular contour with minimally increased T2 signal,  hypointense intrinsic T1 signal, postcontrast enhancement that is slowly increases and is most prominent in intensities 5 minutes postcontrast administration as well as a peripheral rim of reduced diffusivity.  2. Enlarged upper abdominal lymph nodes, likely reflect metastatic nodal disease involvement. 3. Enhancing lesions in the right tenth rib and L2 vertebral body,concerning for osseous metastatic disease. 4. Prominent subcentimeter pericardiophrenic and internal mammary lymph nodes also suspicious for nodal metastatic disease.    03/31/2022 Cancer Staging   Staging form: Hodgkin and Non-Hodgkin Lymphoma, AJCC 8th Edition - Clinical stage from 03/31/2022: Stage IV (Diffuse large B-cell lymphoma) - Signed by [name], MD on 04/19/2022 Stage prefix: Initial diagnosis International Prognostic Index (IPI) score: Score 4 International Prognostic Index risk group: High risk DLBCL cell of origin (COO): Activated B-cell   04/03/2022 Initial Diagnosis   Large cell lymphoma ([place])  04/03/2022, ultrasound-guided liver biopsy showed large B-cell lymphoma.  Additional IHC is pending for subclassification as well as ancillary FISH testing for rearrangementds   04/16/2022 Imaging   PET initial staging showed 1. Multiple bulky intensely hypermetabolic liver masses throughout both liver lobes, compatible with reported lymphoma. Deauville score 5. 2. Mild splenomegaly with mild splenic hypermetabolism, compatible with lymphoma. No enlarged or hypermetabolic lymph nodes. No hypermetabolic skeletal activity. 3. 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 4. Chronic findings include: Aortic Atherosclerosis.Mild sigmoid diverticulosis.     04/20/2022 Procedure   Medi port placed by IR   04/22/2022 -  Chemotherapy   Patient is on Treatment Plan : [place] CHP q21d       INTERVAL HISTORY Joe Adams is a 61 y.o. male,  diagnosed with DLBCL, currently s/p cycle 2 of R-Pola CHP treatments who presents to clinic for evaluation, possible fluids. Says that he is tolerating treatments well. Is out of  some medications qand requests refills. Denies nausea, vomiting, diarrhea, weakness. Fatigue has improved. Takes miralax daily for constipation. Eating and drinking well. Pain improved. Gaining weight. Denies complaints.   MEDICAL HISTORY:  Past Medical History:  Diagnosis Date   Acute MI ([place]) 2005   Diabetes mellitus without complication (HCC)    GERD (gastroesophageal reflux disease)    Headache    Hernia, umbilical    History of kidney stones    Large cell lymphoma ([place]) 04/03/2022   Thyroid disease     SURGICAL HISTORY: Past Surgical History:  Procedure Laterality Date   COLONOSCOPY N/A 11/18/2016   Procedure: COLONOSCOPY IN O.R;  Surgeon: Christene Lye, MD;  Location: [facility] ORS;  Service: General;  Laterality: N/A;   COLONOSCOPY WITH PROPOFOL N/A 11/18/2016   Procedure: COLONOSCOPY WITH PROPOFOL;  Surgeon: Christene Lye, MD;  Location: [facility] ENDOSCOPY;  Service: Endoscopy;  Laterality: N/A;   HEMORRHOID SURGERY N/A 11/18/2016   Procedure: HEMORRHOIDECTOMY;  Surgeon: Christene Lye, MD;  Location: [facility] ORS;  Service: General;  Laterality: N/A;   IR IMAGING GUIDED PORT INSERTION  04/20/2022   TONSILLECTOMY     as a child    SOCIAL HISTORY: Social History   Socioeconomic History   Marital status: Legally Separated    Spouse name: Not on file   Number of children: Not on file   Years of education: Not on file   Highest education level: Not on file  Occupational History   Not on file  Tobacco Use   Smoking status: Never   Smokeless tobacco: Never  Vaping Use   Vaping Use: Never used  Substance and Sexual Activity   Alcohol use: Not Currently    Alcohol/week: 1.0 standard drink of alcohol    Types: 1 Shots of liquor per week    Comment: occ   Drug use: No   Sexual  activity: Not on file  Other Topics Concern   Not on file  Social History Narrative   Not on file   Social Determinants of Health   Financial Resource Strain: Low Risk  (04/15/2022)   Overall Financial Resource Strain (CARDIA)    Difficulty of Paying Living Expenses: Not very hard  Recent Concern: Financial Resource Strain - Medium Risk (04/15/2022)   Overall Financial Resource Strain (CARDIA)    Difficulty of Paying Living Expenses: Somewhat hard  Food Insecurity: No Food Insecurity (04/15/2022)   Hunger Vital Sign    Worried About [place] in the Last Year: Never true    Ran Out of Food in the Last Year: Never true  Transportation Needs: No Transportation Needs (04/15/2022)   PRAPARE - Hydrologist (Medical): No    Lack of Transportation (Non-Medical): No  Physical Activity: Inactive (04/15/2022)   Exercise Vital Sign    Days of Exercise per Week: 0 days    Minutes of Exercise per Session: 0 min  Stress: Stress Concern Present (04/15/2022)   [place]    Feeling of Stress : To some extent  Social Connections: Moderately Integrated (04/15/2022)   Social Connection and Isolation Panel [NHANES]    Frequency of Communication with Friends and Family: Three times a week    Frequency of Social Gatherings with Friends and Family: Twice a week    Attends Religious Services: 1 to 4 times per year    Active Member of [employer] or Organizations: No    Attends [employer]  or Organization Meetings: Never    Marital Status: Living with partner  Intimate Partner Violence: Not At Risk (04/15/2022)   Humiliation, Afraid, Rape, and Kick questionnaire    Fear of Current or Ex-Partner: No    Emotionally Abused: No    Physically Abused: No    Sexually Abused: No    FAMILY HISTORY: Family History  Problem Relation Age of Onset   Cancer Other    Cancer Paternal Uncle     ALLERGIES:  has No  Known Allergies.  MEDICATIONS:  Current Outpatient Medications  Medication Sig Dispense Refill   acyclovir (ZOVIRAX) 400 MG tablet Take 1 tablet (400 mg total) by mouth daily. 30 tablet 3   allopurinol (ZYLOPRIM) 100 MG tablet Take 1 tablet (100 mg total) by mouth daily. 30 tablet 1   Docusate Calcium (STOOL SOFTENER PO) Take by mouth.     HYDROcodone-acetaminophen (NORCO/VICODIN) 5-325 MG tablet Take 1 tablet by mouth every 6 (six) hours as needed.     lactulose (CHRONULAC) 10 GM/15ML solution Take 15 mLs (10 g total) by mouth daily as needed for mild constipation. 236 mL 0   levothyroxine (SYNTHROID) 150 MCG tablet Take 150 mcg by mouth daily.     lidocaine-prilocaine (EMLA) cream Apply to affected area once 30 g 3   loratadine (CLARITIN) 10 MG tablet Take 10 mg by mouth daily.     metFORMIN (GLUCOPHAGE) 1000 MG tablet 2 (two) times daily.     oxyCODONE-acetaminophen (PERCOCET) 5-325 MG tablet Take 1 tablet by mouth every 6 (six) hours as needed for severe pain. 60 tablet 0   predniSONE (DELTASONE) 20 MG tablet Take 5 tablets (100 mg total) by mouth daily. Take with food on days 2-5 of chemotherapy. 20 tablet 5   prochlorperazine (COMPAZINE) 10 MG tablet Take 1 tablet (10 mg total) by mouth every 6 (six) hours as needed for nausea or vomiting. 30 tablet 6   ciprofloxacin (CIPRO) 500 MG tablet Take 1 tablet (500 mg total) by mouth 2 (two) times daily. (Patient not taking: Reported on 05/18/2022) 10 tablet 0   ondansetron (ZOFRAN) 8 MG tablet Take 1 tablet (8 mg total) by mouth every 8 (eight) hours as needed for nausea or vomiting. Start on the third day after cyclophosphamide chemotherapy. (Patient not taking: Reported on 04/28/2022) 30 tablet 1   No current facility-administered medications for this visit.    Review of Systems  Constitutional:  Negative for appetite change, chills and unexpected weight change.  HENT:   Negative for lump/mass, mouth sores, nosebleeds, sore throat and  trouble swallowing.   Respiratory:  Negative for cough, hemoptysis and shortness of breath.   Cardiovascular:  Negative for chest pain and palpitations.  Gastrointestinal:  Negative for abdominal distention, abdominal pain, blood in stool, constipation, diarrhea, nausea and vomiting.  Endocrine: Negative for hot flashes.  Genitourinary:  Negative for bladder incontinence, difficulty urinating, dysuria and hematuria.   Musculoskeletal:  Negative for arthralgias and myalgias.  Skin:  Negative for rash and wound.  Neurological:  Negative for dizziness, extremity weakness, headaches and light-headedness.  Hematological:  Negative for adenopathy. Does not bruise/bleed easily.  Psychiatric/Behavioral:  Negative for depression and sleep disturbance.   All other systems reviewed and are negative.  PHYSICAL EXAMINATION: ECOG PERFORMANCE STATUS: 1 - Symptomatic but completely ambulatory Vitals:   05/25/22 0900  BP: 132/87  Pulse: 81  Temp: 99.1 F (37.3 C)  SpO2: 99%   Filed Weights   05/25/22 0900  Weight: 178 lb (80.7  kg)    Physical Exam Vitals reviewed.  Constitutional:      General: He is not in acute distress.    Appearance: He is well-developed. He is not ill-appearing.  Eyes:     General: No scleral icterus. Cardiovascular:     Rate and Rhythm: Normal rate and regular rhythm.  Pulmonary:     Effort: Pulmonary effort is normal.     Breath sounds: Normal breath sounds. No wheezing.  Abdominal:     General: There is no distension.     Palpations: Abdomen is soft.     Tenderness: There is no abdominal tenderness.  Musculoskeletal:     Right lower leg: No edema.     Left lower leg: No edema.  Skin:    General: Skin is warm and dry.  Neurological:     Mental Status: He is alert and oriented to person, place, and time. Mental status is at baseline.  Psychiatric:        Mood and Affect: Mood normal.        Behavior: Behavior normal.     LABORATORY DATA:  I have  reviewed the data as listed    Latest Ref Rng & Units 05/25/2022    8:51 AM 05/18/2022    8:50 AM 05/05/2022    8:00 AM  CBC  WBC 4.0 - 10.5 K/uL 14.9  9.0  22.1   Hemoglobin 13.0 - 17.0 g/dL 11.7  12.8  11.6   Hematocrit 39.0 - 52.0 % 34.3  37.4  35.1   Platelets 150 - 400 K/uL 123  267  109       Latest Ref Rng & Units 05/25/2022    8:51 AM 05/18/2022    8:50 AM 04/29/2022    3:24 PM  CMP  Glucose 70 - 99 mg/dL 139  136  231   BUN 8 - 23 mg/dL 34  26  25   Creatinine 0.61 - 1.24 mg/dL 0.70  0.75  0.74   Sodium 135 - 145 mmol/L 136  133  130   Potassium 3.5 - 5.1 mmol/L 4.0  4.4  4.4   Chloride 98 - 111 mmol/L 104  106  96   CO2 22 - 32 mmol/L '22  21  24   '$ Calcium 8.9 - 10.3 mg/dL 8.8  9.1  9.6   Total Protein 6.5 - 8.1 g/dL 6.0  7.2  6.8   Total Bilirubin 0.3 - 1.2 mg/dL 0.4  0.4  1.0   Alkaline Phos 38 - 126 U/L 92  138  238   AST 15 - 41 U/L 24  25  79   ALT 0 - 44 U/L 29  23  137     RADIOGRAPHIC STUDIES: No imaging on site today  ASSESSMENT & PLAN:   Cancer Staging  Diffuse large B cell lymphoma (HCC) Staging form: Hodgkin and Non-Hodgkin Lymphoma, AJCC 8th Edition - Clinical stage from 03/31/2022: Stage IV (Diffuse large B-cell lymphoma) - Signed by [name], MD on 04/19/2022  Diffuse large B cell lymphoma ([place])- s/p cycle 2 R-Pola-CHP with GCSF with IT MTX/hydrocortisone. Takes D2-D5 prednisone. On TLS prophylaxis with allopurinol and acyclovir. REviewed that he has refills at pharmacy to pick up. Symptomatically doing well. Labs reviewed and reassuring. No evidence of TLS. Proceed with IV fluids today.   Leukocytosis- likely secondary to GCSF administration on 05/21/22. High risk of infection. Monitor symptoms.    Unintentional weight loss- secondary to lymphoma. Weight has increased. Continue follow  up with dietician   Neoplasm related pain- pain improving. Continue oxycodone as prescribed.   Disposition: Follow up as scheduled  All questions were answered.  The patient knows to call the clinic with any problems, questions or concerns.  Beckey Rutter, DNP, AGNP-C [place] [facility] [phone] (clinic)

## 2022-05-26 ENCOUNTER — Other Ambulatory Visit: Payer: Self-pay | Admitting: *Deleted

## 2022-05-26 NOTE — Telephone Encounter (Signed)
Patient called and said he was told yesterday that he needs to stay on Cipro and he needs refill of his Allopurinol too

## 2022-05-26 NOTE — Telephone Encounter (Signed)
Allopurinol was sent 11/30

## 2022-05-27 ENCOUNTER — Other Ambulatory Visit: Payer: Self-pay | Admitting: Oncology

## 2022-05-29 ENCOUNTER — Telehealth: Payer: Self-pay | Admitting: Oncology

## 2022-05-29 NOTE — Telephone Encounter (Signed)
Pet has been cancelled, pt has been notified

## 2022-06-02 ENCOUNTER — Ambulatory Visit: Payer: 59

## 2022-06-03 NOTE — Telephone Encounter (Signed)
PET has been approved. Message has been sent to scheduling to r/s appt.

## 2022-06-04 NOTE — Progress Notes (Signed)
Enrolled patient into the [employer] 

## 2022-06-05 ENCOUNTER — Encounter
Admission: RE | Admit: 2022-06-05 | Discharge: 2022-06-05 | Disposition: A | Discharge status: Home / Self Care | Payer: 59 | Source: Ambulatory Visit | Attending: Oncology | Admitting: Oncology

## 2022-06-05 DIAGNOSIS — C8338 Diffuse large B-cell lymphoma, lymph nodes of multiple sites: Secondary | ICD-10-CM | POA: Insufficient documentation

## 2022-06-05 LAB — GLUCOSE, CAPILLARY: Glucose-Capillary: 246 mg/dL — ABNORMAL HIGH (ref 70–99)

## 2022-06-05 MED ORDER — FLUDEOXYGLUCOSE F - 18 (FDG) INJECTION
9.5600 | Freq: Once | INTRAVENOUS | Status: AC | PRN
  Administered 2022-06-05: 9.56 via INTRAVENOUS

## 2022-06-08 ENCOUNTER — Inpatient Hospital Stay: Payer: 59

## 2022-06-08 ENCOUNTER — Inpatient Hospital Stay (HOSPITAL_BASED_OUTPATIENT_CLINIC_OR_DEPARTMENT_OTHER): Payer: 59 | Admitting: Oncology

## 2022-06-08 ENCOUNTER — Encounter: Payer: Self-pay | Admitting: Oncology

## 2022-06-08 ENCOUNTER — Telehealth: Payer: Self-pay

## 2022-06-08 VITALS — BP 120/82 | HR 84 | Temp 97.6°F | Wt 179.9 lb

## 2022-06-08 VITALS — BP 115/79 | HR 75 | Resp 18

## 2022-06-08 DIAGNOSIS — C8338 Diffuse large B-cell lymphoma, lymph nodes of multiple sites: Secondary | ICD-10-CM | POA: Diagnosis not present

## 2022-06-08 DIAGNOSIS — Z5111 Encounter for antineoplastic chemotherapy: Secondary | ICD-10-CM

## 2022-06-08 DIAGNOSIS — G893 Neoplasm related pain (acute) (chronic): Secondary | ICD-10-CM

## 2022-06-08 DIAGNOSIS — R634 Abnormal weight loss: Secondary | ICD-10-CM | POA: Diagnosis not present

## 2022-06-08 LAB — COMPREHENSIVE METABOLIC PANEL
ALT: 29 U/L (ref 0–44)
AST: 22 U/L (ref 15–41)
Albumin: 4 g/dL (ref 3.5–5.0)
Alkaline Phosphatase: 148 U/L — ABNORMAL HIGH (ref 38–126)
Anion gap: 10 (ref 5–15)
BUN: 31 mg/dL — ABNORMAL HIGH (ref 8–23)
CO2: 21 mmol/L — ABNORMAL LOW (ref 22–32)
Calcium: 9.2 mg/dL (ref 8.9–10.3)
Chloride: 102 mmol/L (ref 98–111)
Creatinine, Ser: 1.14 mg/dL (ref 0.61–1.24)
GFR, Estimated: >60 mL/min (ref >60)
Glucose, Bld: 291 mg/dL — ABNORMAL HIGH (ref 70–99)
Potassium: 4.5 mmol/L (ref 3.5–5.1)
Sodium: 133 mmol/L — ABNORMAL LOW (ref 135–145)
Total Bilirubin: 0.5 mg/dL (ref 0.3–1.2)
Total Protein: 6.8 g/dL (ref 6.5–8.1)

## 2022-06-08 LAB — CBC WITH DIFFERENTIAL/PLATELET
Abs Immature Granulocytes: 0.27 10*3/uL — ABNORMAL HIGH (ref 0.00–0.07)
Basophils Absolute: 0.1 10*3/uL (ref 0.0–0.1)
Basophils Relative: 1 %
Eosinophils Absolute: 0.1 10*3/uL (ref 0.0–0.5)
Eosinophils Relative: 2 %
HCT: 36 % — ABNORMAL LOW (ref 39.0–52.0)
Hemoglobin: 12.6 g/dL — ABNORMAL LOW (ref 13.0–17.0)
Immature Granulocytes: 3 %
Lymphocytes Relative: 12 %
Lymphs Abs: 1 10*3/uL (ref 0.7–4.0)
MCH: 29.4 pg (ref 26.0–34.0)
MCHC: 35 g/dL (ref 30.0–36.0)
MCV: 84.1 fL (ref 80.0–100.0)
Monocytes Absolute: 1.2 10*3/uL — ABNORMAL HIGH (ref 0.1–1.0)
Monocytes Relative: 14 %
Neutro Abs: 6 10*3/uL (ref 1.7–7.7)
Neutrophils Relative %: 68 %
Platelets: 229 10*3/uL (ref 150–400)
RBC: 4.28 MIL/uL (ref 4.22–5.81)
RDW: 18.2 % — ABNORMAL HIGH (ref 11.5–15.5)
WBC: 8.7 10*3/uL (ref 4.0–10.5)
nRBC: 0 % (ref 0.0–0.2)

## 2022-06-08 LAB — LACTATE DEHYDROGENASE: LDH: 137 U/L (ref 98–192)

## 2022-06-08 MED ORDER — DIPHENHYDRAMINE HCL 25 MG PO CAPS
50.0000 mg | ORAL_CAPSULE | Freq: Once | ORAL | Status: AC
  Administered 2022-06-08: 50 mg via ORAL
  Filled 2022-06-08: qty 2

## 2022-06-08 MED ORDER — FAMOTIDINE IN NACL 20-0.9 MG/50ML-% IV SOLN
20.0000 mg | Freq: Once | INTRAVENOUS | Status: AC
  Administered 2022-06-08: 20 mg via INTRAVENOUS
  Filled 2022-06-08: qty 50

## 2022-06-08 MED ORDER — HEPARIN SOD (PORK) LOCK FLUSH 100 UNIT/ML IV SOLN
500.0000 [IU] | Freq: Once | INTRAVENOUS | Status: AC
  Administered 2022-06-08: 500 [IU] via INTRAVENOUS
  Filled 2022-06-08: qty 5

## 2022-06-08 MED ORDER — SODIUM CHLORIDE 0.9 % IV SOLN
375.0000 mg/m2 | Freq: Once | INTRAVENOUS | Status: AC
  Administered 2022-06-08: 700 mg via INTRAVENOUS
  Filled 2022-06-08: qty 50

## 2022-06-08 MED ORDER — SODIUM CHLORIDE 0.9 % IV SOLN
Freq: Once | INTRAVENOUS | Status: AC
  Filled 2022-06-08: qty 250

## 2022-06-08 MED ORDER — ACETAMINOPHEN 325 MG PO TABS
650.0000 mg | ORAL_TABLET | Freq: Once | ORAL | Status: AC
  Administered 2022-06-08: 650 mg via ORAL
  Filled 2022-06-08: qty 2

## 2022-06-08 MED FILL — Dexamethasone Sodium Phosphate Inj 100 MG/10ML: INTRAMUSCULAR | Qty: 1 | Status: AC

## 2022-06-08 NOTE — Assessment & Plan Note (Signed)
oxycodone PRN as directed. Pain has improved.

## 2022-06-08 NOTE — Progress Notes (Signed)
Hematology/Oncology Progress note [phone] [phone]       Patient Care Team: Ranae Plumber, [place] as PCP - General (Family Medicine) Ranae Plumber, [place] (Family Medicine) Christene Lye, MD (General Surgery) Clent Jacks, RN as Oncology Nurse Navigator  ASSESSMENT & PLAN:   Cancer Staging  Diffuse large B cell lymphoma [facility]) Staging form: Hodgkin and Non-Hodgkin Lymphoma, AJCC 8th Edition - Clinical stage from 03/31/2022: Stage IV (Diffuse large B-cell lymphoma) - Signed by [name], MD on 04/19/2022   Diffuse large B cell lymphoma ([place]) BCL6 cmyc IHC positive, FISH negative for BCL2, BCL6, and CMYC rearrangement.  PET scan was reviewed and discussed with patient.- liver, spleen, possible  thyroid,  involvements.  Marrow is negative on PET.  bone marrow biopsy is negative.   IPI score is 4. CNS IPI - 4 Interim PET showed CR.  Labs are reviewed and discussed with patient. Proceed with cycle 3 R-Pola -CHP with GCSF - IT MTX/hydrocortisone.  Recommend patient to take D2-5 prednisone continue allopurinol and acyclovir.   Encounter for antineoplastic chemotherapy Treatment plan as listed above  Neoplasm related pain oxycodone PRN as directed. Pain has improved.   Unintentional weight loss Due to lymphoma. He has gained weight.  Encourage nutritional supplementation.   Orders Placed This Encounter  Procedures   Lactate dehydrogenase    Standing Status:   Future    Standing Expiration Date:   06/30/2023   CBC with Differential    Standing Status:   Future    Standing Expiration Date:   06/30/2023   Comprehensive metabolic panel    Standing Status:   Future    Standing Expiration Date:   06/30/2023   Lactate dehydrogenase    Standing Status:   Future    Standing Expiration Date:   07/21/2023   CBC with Differential    Standing Status:   Future    Standing Expiration Date:   07/21/2023   Comprehensive metabolic panel    Standing Status:    Future    Standing Expiration Date:   07/21/2023   CBC with Differential/Platelet    Standing Status:   Future    Standing Expiration Date:   06/09/2023   Comprehensive metabolic panel    Standing Status:   Future    Standing Expiration Date:   06/08/2023   Follow up  3 weeks lab MD R- pola CHP IT MTX  D3 udenyca  All questions were answered. The patient knows to call the clinic with any problems, questions or concerns.  [name], MD, PhD [facility] Hematology Oncology 06/08/2022    CHIEF COMPLAINTS/REASON FOR VISIT:  Stage IV DLBCL  HISTORY OF PRESENTING ILLNESS:   Joe Adams is a  61 y.o.  male with PMH listed below was seen in consultation at the request of  [name], MD  for evaluation of liver mass.  03/22/2022, patient presented to emergency room for evaluation of right side lower chest/upper abdomen/flank pain, intermittent low-grade fever and chills, night sweats and unintentional weight loss of 20 to 30 pounds over the past 6 months.  He finished a course of antibiotics a few months ago for unknown infection and symptom has not improved.  Oncology History  Diffuse large B cell lymphoma ([place])  03/22/2022 Imaging   CT chest angiogram, CT abdomen pelvis w contrast  1. No acute pulmonary embolism. 2. There are multiple heterogeneously enhancing masses throughout the liver, several which demonstrate capsular retraction. No definitive extrahepatic primary is  identified. Findings could reflect multifocal hepatocellular carcinoma/cholangiocarcinoma, lymphoma or abscess. Recommend correlation with blood chemistry values. Liver MRI with and without contrast could be of assistance in further characterizing these masses. 3. Splenomegaly.4. Multiple borderline enlarged lymph nodes above and below the diaphragm. 5. Homogeneously enlarged thyroid gland, nonspecific. Consider further dedicated evaluation with nonemergent thyroid ultrasound. 6. Mild bronchial wall thickening with  bibasilar atelectasis. This could reflect a nonspecific bronchitis.   03/22/2022 Imaging   MRI liver w wo contrast 1. Bilobar hepatic lesions demonstrate irregular contour with minimally increased T2 signal, hypointense intrinsic T1 signal, postcontrast enhancement that is slowly increases and is most prominent in intensities 5 minutes postcontrast administration as well as a peripheral rim of reduced diffusivity.  2. Enlarged upper abdominal lymph nodes, likely reflect metastatic nodal disease involvement. 3. Enhancing lesions in the right tenth rib and L2 vertebral body,concerning for osseous metastatic disease. 4. Prominent subcentimeter pericardiophrenic and internal mammary lymph nodes also suspicious for nodal metastatic disease.    03/31/2022 Cancer Staging   Staging form: Hodgkin and Non-Hodgkin Lymphoma, AJCC 8th Edition - Clinical stage from 03/31/2022: Stage IV (Diffuse large B-cell lymphoma) - Signed by [name], MD on 04/19/2022 Stage prefix: Initial diagnosis International Prognostic Index (IPI) score: Score 4 International Prognostic Index risk group: High risk DLBCL cell of origin (COO): Activated B-cell   04/03/2022 Initial Diagnosis   Large cell lymphoma ([place])  04/03/2022, ultrasound-guided liver biopsy showed large B-cell lymphoma.  Additional IHC is pending for subclassification as well as ancillary FISH testing for rearrangementds   04/16/2022 Imaging   PET initial staging showed 1. Multiple bulky intensely hypermetabolic liver masses throughout both liver lobes, compatible with reported lymphoma. Deauville score 5. 2. Mild splenomegaly with mild splenic hypermetabolism, compatible with lymphoma. No enlarged or hypermetabolic lymph nodes. No hypermetabolic skeletal activity. 3. 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 4. Chronic findings include: Aortic Atherosclerosis.Mild sigmoid diverticulosis.      04/20/2022 Procedure   Medi port placed by IR   04/22/2022 -  Chemotherapy   Patient is on Treatment Plan : NON-HODGKINS LYMPHOMA R-POLA CHP q21d     06/05/2022 Imaging   Restaging PET scan showed -PET-CT findings consistent with a complete metabolic response. No residual hypermetabolism involving the liver (Deauville 1). All measurable liver lesions are much smaller.   Mild residual FDG uptake in the thyroid gland. This could be due to treated thyroiditis. Treated thyroid gland involvement with lymphoma is also possible (Deauville 2).      INTERVAL HISTORY Joe Adams is a 61 y.o. male who has above history reviewed by me today presents for follow up visit for large B-cell lymphoma.   Status post cycle 2 R- Pola CHP treatments.  Overall he tolerates well.   Pain is better, he has gained weight.  Non  MEDICAL HISTORY:  Past Medical History:  Diagnosis Date   Acute MI ([place]) 2005   Diabetes mellitus without complication (HCC)    GERD (gastroesophageal reflux disease)    Headache    Hernia, umbilical    History of kidney stones    Large cell lymphoma ([place]) 04/03/2022   Thyroid disease     SURGICAL HISTORY: Past Surgical History:  Procedure Laterality Date   COLONOSCOPY N/A 11/18/2016   Procedure: COLONOSCOPY IN O.R;  Surgeon: Christene Lye, MD;  Location: [facility] ORS;  Service: General;  Laterality: N/A;   COLONOSCOPY WITH PROPOFOL N/A 11/18/2016   Procedure: COLONOSCOPY WITH PROPOFOL;  Surgeon:  Christene Lye, MD;  Location: [facility] ENDOSCOPY;  Service: Endoscopy;  Laterality: N/A;   HEMORRHOID SURGERY N/A 11/18/2016   Procedure: HEMORRHOIDECTOMY;  Surgeon: Christene Lye, MD;  Location: [facility] ORS;  Service: General;  Laterality: N/A;   IR IMAGING GUIDED PORT INSERTION  04/20/2022   TONSILLECTOMY     as a child    SOCIAL HISTORY: Social History   Socioeconomic History   Marital status: Legally Separated    Spouse name: Not on file   Number of  children: Not on file   Years of education: Not on file   Highest education level: Not on file  Occupational History   Not on file  Tobacco Use   Smoking status: Never   Smokeless tobacco: Never  Vaping Use   Vaping Use: Never used  Substance and Sexual Activity   Alcohol use: Not Currently    Alcohol/week: 1.0 standard drink of alcohol    Types: 1 Shots of liquor per week    Comment: occ   Drug use: No   Sexual activity: Not on file  Other Topics Concern   Not on file  Social History Narrative   Not on file   Social Determinants of Health   Financial Resource Strain: Low Risk  (04/15/2022)   Overall Financial Resource Strain (CARDIA)    Difficulty of Paying Living Expenses: Not very hard  Recent Concern: Financial Resource Strain - Medium Risk (04/15/2022)   Overall Financial Resource Strain (CARDIA)    Difficulty of Paying Living Expenses: Somewhat hard  Food Insecurity: No Food Insecurity (04/15/2022)   Hunger Vital Sign    Worried About [place] in the Last Year: Never true    Ran Out of Food in the Last Year: Never true  Transportation Needs: No Transportation Needs (04/15/2022)   PRAPARE - Hydrologist (Medical): No    Lack of Transportation (Non-Medical): No  Physical Activity: Inactive (04/15/2022)   Exercise Vital Sign    Days of Exercise per Week: 0 days    Minutes of Exercise per Session: 0 min  Stress: Stress Concern Present (04/15/2022)   [place]    Feeling of Stress : To some extent  Social Connections: Moderately Integrated (04/15/2022)   Social Connection and Isolation Panel [NHANES]    Frequency of Communication with Friends and Family: Three times a week    Frequency of Social Gatherings with Friends and Family: Twice a week    Attends Religious Services: 1 to 4 times per year    Active Member of [employer] or Organizations: No    Attends Theatre manager Meetings: Never    Marital Status: Living with partner  Intimate Partner Violence: Not At Risk (04/15/2022)   Humiliation, Afraid, Rape, and Kick questionnaire    Fear of Current or Ex-Partner: No    Emotionally Abused: No    Physically Abused: No    Sexually Abused: No    FAMILY HISTORY: Family History  Problem Relation Age of Onset   Cancer Other    Cancer Paternal Uncle     ALLERGIES:  has No Known Allergies.  MEDICATIONS:  Current Outpatient Medications  Medication Sig Dispense Refill   acyclovir (ZOVIRAX) 400 MG tablet Take 1 tablet (400 mg total) by mouth daily. 30 tablet 3   allopurinol (ZYLOPRIM) 100 MG tablet Take 1 tablet (100 mg total) by mouth daily. 30 tablet 1   Docusate Calcium (  STOOL SOFTENER PO) Take by mouth.     HYDROcodone-acetaminophen (NORCO/VICODIN) 5-325 MG tablet Take 1 tablet by mouth every 6 (six) hours as needed.     lactulose (CHRONULAC) 10 GM/15ML solution Take 15 mLs (10 g total) by mouth daily as needed for mild constipation. 236 mL 0   levothyroxine (SYNTHROID) 150 MCG tablet Take 150 mcg by mouth daily.     lidocaine-prilocaine (EMLA) cream Apply to affected area once 30 g 3   loratadine (CLARITIN) 10 MG tablet Take 10 mg by mouth daily.     metFORMIN (GLUCOPHAGE) 1000 MG tablet 2 (two) times daily.     oxyCODONE-acetaminophen (PERCOCET) 5-325 MG tablet Take 1 tablet by mouth every 6 (six) hours as needed for severe pain. 60 tablet 0   predniSONE (DELTASONE) 20 MG tablet Take 5 tablets (100 mg total) by mouth daily. Take with food on days 2-5 of chemotherapy. 20 tablet 5   prochlorperazine (COMPAZINE) 10 MG tablet Take 1 tablet (10 mg total) by mouth every 6 (six) hours as needed for nausea or vomiting. 30 tablet 6   ondansetron (ZOFRAN) 8 MG tablet Take 1 tablet (8 mg total) by mouth every 8 (eight) hours as needed for nausea or vomiting. Start on the third day after cyclophosphamide chemotherapy. (Patient not taking: Reported on  04/28/2022) 30 tablet 1   No current facility-administered medications for this visit.    Review of Systems  Constitutional:  Positive for fatigue. Negative for appetite change, chills, fever and unexpected weight change.  HENT:   Negative for voice change.        Hair loss  Eyes:  Negative for eye problems and icterus.  Respiratory:  Negative for chest tightness, cough and shortness of breath.   Cardiovascular:  Negative for chest pain and leg swelling.  Gastrointestinal:  Negative for abdominal distention and abdominal pain.  Endocrine: Negative for hot flashes.  Genitourinary:  Negative for difficulty urinating, dysuria and frequency.   Musculoskeletal:  Negative for arthralgias.  Skin:  Negative for itching and rash.  Neurological:  Negative for light-headedness and numbness.  Hematological:  Negative for adenopathy. Does not bruise/bleed easily.  Psychiatric/Behavioral:  Negative for confusion.    PHYSICAL EXAMINATION: ECOG PERFORMANCE STATUS: 1 - Symptomatic but completely ambulatory Vitals:   06/08/22 0838  BP: 120/82  Pulse: 84  Temp: 97.6 F (36.4 C)  SpO2: 98%   Filed Weights   06/08/22 0838  Weight: 179 lb 14.4 oz (81.6 kg)    Physical Exam Constitutional:      General: He is not in acute distress. HENT:     Head: Normocephalic and atraumatic.  Eyes:     General: No scleral icterus. Cardiovascular:     Rate and Rhythm: Normal rate.  Pulmonary:     Effort: Pulmonary effort is normal. No respiratory distress.     Breath sounds: No wheezing.  Abdominal:     General: There is no distension.  Musculoskeletal:        General: No deformity. Normal range of motion.     Cervical back: Normal range of motion and neck supple.  Skin:    General: Skin is warm.     Findings: No erythema or rash.  Neurological:     Mental Status: He is alert and oriented to person, place, and time. Mental status is at baseline.     Cranial Nerves: No cranial nerve deficit.      Coordination: Coordination normal.  Psychiatric:  Mood and Affect: Mood normal.     LABORATORY DATA:  I have reviewed the data as listed    Latest Ref Rng & Units 06/08/2022    8:28 AM 05/25/2022    8:51 AM 05/18/2022    8:50 AM  CBC  WBC 4.0 - 10.5 K/uL 8.7  14.9  9.0   Hemoglobin 13.0 - 17.0 g/dL 12.6  11.7  12.8   Hematocrit 39.0 - 52.0 % 36.0  34.3  37.4   Platelets 150 - 400 K/uL 229  123  267       Latest Ref Rng & Units 06/08/2022    8:28 AM 05/25/2022    8:51 AM 05/18/2022    8:50 AM  CMP  Glucose 70 - 99 mg/dL 291  139  136   BUN 8 - 23 mg/dL 31  34  26   Creatinine 0.61 - 1.24 mg/dL 1.14  0.70  0.75   Sodium 135 - 145 mmol/L 133  136  133   Potassium 3.5 - 5.1 mmol/L 4.5  4.0  4.4   Chloride 98 - 111 mmol/L 102  104  106   CO2 22 - 32 mmol/L _0 Calcium 8.9 - 10.3 mg/dL 9.2  8.8  9.1   Total Protein 6.5 - 8.1 g/dL 6.8  6.0  7.2   Total Bilirubin 0.3 - 1.2 mg/dL 0.5  0.4  0.4   Alkaline Phos 38 - 126 U/L 148  92  138   AST 15 - 41 U/L _1 ALT 0 - 44 U/L _2 RADIOGRAPHIC STUDIES: I have personally reviewed the radiological images as listed and agreed with the findings in the report. US BIOPSY (LIVER)  Result Date: 03/31/2022 INDICATION: 61 year old male with multifocal liver masses of indeterminate etiology. EXAM: ULTRASOUND GUIDED LIVER LESION BIOPSY COMPARISON:  None Available. MEDICATIONS: None ANESTHESIA/SEDATION: Fentanyl 50 mcg IV; Versed 1 mg IV Total Moderate Sedation time:  10 minutes. The patient's level of consciousness and vital signs were monitored continuously by radiology nursing throughout the procedure under my direct supervision. COMPLICATIONS: None immediate. PROCEDURE: Informed written consent was obtained from the patient after a discussion of the risks, benefits and alternatives to treatment. The patient understands and consents the procedure. A timeout was performed prior to the initiation of the  procedure. Ultrasound scanning was performed of the right upper abdominal quadrant demonstrates multifocal heterogeneously solid masses throughout the liver. A subcapsular right lobe mass was selected for biopsy and the procedure was planned. The right upper abdominal quadrant was prepped and draped in the usual sterile fashion. The overlying soft tissues were anesthetized with 1% lidocaine with epinephrine. A 17 gauge, 6.8 cm co-axial needle was advanced into a peripheral aspect of the lesion. This was followed by 4 core biopsies with an 18 gauge core device under direct ultrasound guidance. The coaxial needle tract was embolized with a small amount of Gel-Foam slurry and superficial hemostasis was obtained with manual compression. Post procedural scanning was negative for definitive area of hemorrhage or additional complication. A dressing was placed. The patient tolerated the procedure well without immediate post procedural complication. IMPRESSION: Technically successful ultrasound guided core needle biopsy of right lobe liver mass. Ruthann Cancer, MD [facility] Electronically Signed   By: Ruthann Cancer M.D.   On: 03/31/2022 10:49   MR LIVER W WO CONTRAST  Result Date: 03/22/2022 CLINICAL DATA:  Further evaluation of hepatic lesions seen on prior CT. EXAM: MRI ABDOMEN WITHOUT AND WITH CONTRAST TECHNIQUE: Multiplanar multisequence MR imaging of the abdomen was performed both before and after the administration of intravenous contrast. CONTRAST:  7cc of [place] COMPARISON:  Multiple priors including most recent CT chest abdomen pelvis dated March 22, 2022. FINDINGS: Lower chest: No acute abnormality. Hepatobiliary: Bilobar hepatic lesions demonstrate irregular contour with minimally increased T2 signal and hypointense intrinsic T1 signal demonstrating postcontrast enhancement which slowly increases over progressive postcontrast pulse sequences most  dominant on the 5 minute delayed and with a peripheral rim of reduced diffusivity. For reference: -lesion in the central right lobe of the liver measures 9.2 x 7.2 cm on image 49/13 -segment IVa/VIII hepatic lesion measures 7.9 x 5.4 cm on image 31/13 -segment II hepatic lesion measures 3.4 x 2.7 cm on image 21/13. No evidence of portal venous involvement. Gallbladder is unremarkable.  No biliary ductal dilation. Pancreas: No pancreatic ductal dilation or evidence of acute inflammation. No suspicious pancreatic mass identified. Spleen:  No splenomegaly or focal splenic lesion. Adrenals/Urinary Tract: Bilateral adrenal glands are within normal limits. No hydronephrosis. No solid enhancing renal mass. Stomach/Bowel: Visualized portions within the abdomen are unremarkable. Vascular/Lymphatic: Normal caliber abdominal aorta. The portal, splenic and superior mesenteric veins are patent. Enlarged retroperitoneal, portacaval, periportal, and gastrohepatic ligament lymph nodes. For reference: -portacaval lymph node measures 13 mm in short axis on image 49/16. -periportal lymph node measures 10 mm in short axis on image 44/16. Prominent subcentimeter pericardiophrenic and internal mammary lymph nodes. For reference: -A lymph node along the inferior aspect of the internal mammary artery on the right below the xiphoid process measures 8 mm in short axis on image 18/22 -A pericardiophrenic lymph node measures 7 mm in short axis on image 88/22. Other:  No significant abdominopelvic free fluid. Musculoskeletal: Enhancing lesion in the right tenth rib on image 38/22. Enhancing lesion in the L2 vertebral body on image 56/22. IMPRESSION: 1. Bilobar hepatic lesions demonstrate irregular contour with minimally increased T2 signal, hypointense intrinsic T1 signal, postcontrast enhancement that is slowly increases and is most prominent in intensities 5 minutes postcontrast administration as well as a peripheral rim of reduced  diffusivity. 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 With alternate differential consideration of atypical hepatocellular carcinoma. Recommend oncology referral with multidisciplinary tumor board consultation and definitive diagnosis with direct tissue sampling. 2. Enlarged upper abdominal lymph nodes, likely reflect metastatic nodal disease involvement. 3. Enhancing lesions in the right tenth rib and L2 vertebral body, concerning for osseous metastatic disease. 4. Prominent subcentimeter pericardiophrenic and internal mammary lymph nodes also suspicious for nodal metastatic disease. Electronically Signed   By: Dahlia Bailiff M.D.   On: 03/22/2022 19:52   CT Angio Chest PE W/Cm &/Or Wo Cm  Result Date: 03/22/2022 CLINICAL DATA:  Abdominal pain, acute, nonlocalized; Pulmonary embolism (PE) suspected, high prob EXAM: CT ANGIOGRAPHY CHEST CT ABDOMEN AND PELVIS WITH CONTRAST TECHNIQUE: Multidetector CT imaging of the chest was performed using the standard protocol during bolus administration of intravenous contrast. Multiplanar CT image reconstructions and MIPs were obtained to evaluate the vascular anatomy. Multidetector CT imaging of the abdomen and pelvis was performed using the standard protocol during bolus administration of intravenous contrast. body onc RADIATION DOSE REDUCTION: This exam was performed according to the departmental dose-optimization program which includes automated exposure control, adjustment of the mA and/or kV according to patient size and/or use of iterative reconstruction  technique. CONTRAST:  54m OMNIPAQUE IOHEXOL 350 MG/ML SOLN COMPARISON:  Oct 31, 2014 FINDINGS: CTA CHEST FINDINGS Cardiovascular: Satisfactory opacification of the pulmonary arteries to the segmental level. No evidence of pulmonary embolism. Mildly enlarged heart size. No pericardial effusion. Mediastinum/Nodes: Homogeneously enlarged thyroid gland. There are multiple borderline  enlarged mediastinal lymph nodes. RIGHT paratracheal lymph node measures 11 mm in the short axis (series 507, image 55). No axillary adenopathy or hilar adenopathy. Lungs/Pleura: No pleural effusion or pneumothorax. Mild bronchial wall thickening with scattered areas of endobronchial debris. Bibasilar atelectasis. Musculoskeletal: No aggressive osseous abnormality. Review of the MIP images confirms the above findings. CT ABDOMEN and PELVIS FINDINGS Hepatobiliary: There are multiple heterogeneously enhancing masses throughout the liver. Representative mass within hepatic segment 5/6 spans approximately 8.6 cm (series 11, image 33). Representative heterogeneously enhancing mass spanning hepatic segments 6/7 with associated capsular retraction spans at least 11.8 cm (series 14, image 70). Gallbladder is unremarkable. Portal vein is patent. Pancreas: Unremarkable. No pancreatic ductal dilatation or surrounding inflammatory changes. Spleen: Splenomegaly. Adrenals/Urinary Tract: Adrenal glands are unremarkable. Kidneys are normal, without renal calculi, focal lesion, or hydronephrosis. Bladder is unremarkable. Stomach/Bowel: Stomach is within normal limits. Appendix appears normal. No evidence of bowel wall thickening, distention, or inflammatory changes. Vascular/Lymphatic: Abdominal aorta is normal in course and caliber. Mildly prominent porta hepatic lymph node measures 15 mm in the short axis (series 11, image 28). Prominent LEFT periaortic lymph node measures 9 mm in the short axis (series 11, image 38). Reproductive: Coarse calcifications of the prostate. Other: No free air or free fluid. Musculoskeletal: No aggressive osseous lesions. Limbus vertebra of L5. Review of the MIP images confirms the above findings. IMPRESSION: 1. No acute pulmonary embolism. 2. There are multiple heterogeneously enhancing masses throughout the liver, several which demonstrate capsular retraction. No definitive extrahepatic primary is  identified. Findings could reflect multifocal hepatocellular carcinoma/cholangiocarcinoma, lymphoma or abscess. Recommend correlation with blood chemistry values. Liver MRI with and without contrast could be of assistance in further characterizing these masses. 3. Splenomegaly. 4. Multiple borderline enlarged lymph nodes above and below the diaphragm. 5. Homogeneously enlarged thyroid gland, nonspecific. Consider further dedicated evaluation with nonemergent thyroid ultrasound. 6. Mild bronchial wall thickening with bibasilar atelectasis. This could reflect a nonspecific bronchitis. These results were called by telephone at the time of interpretation on 03/22/2022 at 4:39 pm to provider Dr. GArchie Balboa who verbally acknowledged these results. Electronically Signed   By: SValentino SaxonM.D.   On: 03/22/2022 16:47   CT Abdomen Pelvis W Contrast  Result Date: 03/22/2022 CLINICAL DATA:  Abdominal pain, acute, nonlocalized; Pulmonary embolism (PE) suspected, high prob EXAM: CT ANGIOGRAPHY CHEST CT ABDOMEN AND PELVIS WITH CONTRAST TECHNIQUE: Multidetector CT imaging of the chest was performed using the standard protocol during bolus administration of intravenous contrast. Multiplanar CT image reconstructions and MIPs were obtained to evaluate the vascular anatomy. Multidetector CT imaging of the abdomen and pelvis was performed using the standard protocol during bolus administration of intravenous contrast. body onc RADIATION DOSE REDUCTION: This exam was performed according to the departmental dose-optimization program which includes automated exposure control, adjustment of the mA and/or kV according to patient size and/or use of iterative reconstruction technique. CONTRAST:  740mOMNIPAQUE IOHEXOL 350 MG/ML SOLN COMPARISON:  Oct 31, 2014 FINDINGS: CTA CHEST FINDINGS Cardiovascular: Satisfactory opacification of the pulmonary arteries to the segmental level. No evidence of pulmonary embolism. Mildly enlarged heart  size. No pericardial effusion. Mediastinum/Nodes: Homogeneously enlarged thyroid gland. There are multiple borderline enlarged mediastinal  lymph nodes. RIGHT paratracheal lymph node measures 11 mm in the short axis (series 507, image 55). No axillary adenopathy or hilar adenopathy. Lungs/Pleura: No pleural effusion or pneumothorax. Mild bronchial wall thickening with scattered areas of endobronchial debris. Bibasilar atelectasis. Musculoskeletal: No aggressive osseous abnormality. Review of the MIP images confirms the above findings. CT ABDOMEN and PELVIS FINDINGS Hepatobiliary: There are multiple heterogeneously enhancing masses throughout the liver. Representative mass within hepatic segment 5/6 spans approximately 8.6 cm (series 11, image 33). Representative heterogeneously enhancing mass spanning hepatic segments 6/7 with associated capsular retraction spans at least 11.8 cm (series 14, image 70). Gallbladder is unremarkable. Portal vein is patent. Pancreas: Unremarkable. No pancreatic ductal dilatation or surrounding inflammatory changes. Spleen: Splenomegaly. Adrenals/Urinary Tract: Adrenal glands are unremarkable. Kidneys are normal, without renal calculi, focal lesion, or hydronephrosis. Bladder is unremarkable. Stomach/Bowel: Stomach is within normal limits. Appendix appears normal. No evidence of bowel wall thickening, distention, or inflammatory changes. Vascular/Lymphatic: Abdominal aorta is normal in course and caliber. Mildly prominent porta hepatic lymph node measures 15 mm in the short axis (series 11, image 28). Prominent LEFT periaortic lymph node measures 9 mm in the short axis (series 11, image 38). Reproductive: Coarse calcifications of the prostate. Other: No free air or free fluid. Musculoskeletal: No aggressive osseous lesions. Limbus vertebra of L5. Review of the MIP images confirms the above findings. IMPRESSION: 1. No acute pulmonary embolism. 2. There are multiple heterogeneously  enhancing masses throughout the liver, several which demonstrate capsular retraction. No definitive extrahepatic primary is identified. Findings could reflect multifocal hepatocellular carcinoma/cholangiocarcinoma, lymphoma or abscess. Recommend correlation with blood chemistry values. Liver MRI with and without contrast could be of assistance in further characterizing these masses. 3. Splenomegaly. 4. Multiple borderline enlarged lymph nodes above and below the diaphragm. 5. Homogeneously enlarged thyroid gland, nonspecific. Consider further dedicated evaluation with nonemergent thyroid ultrasound. 6. Mild bronchial wall thickening with bibasilar atelectasis. This could reflect a nonspecific bronchitis. These results were called by telephone at the time of interpretation on 03/22/2022 at 4:39 pm to provider Dr. Archie Balboa, who verbally acknowledged these results. Electronically Signed   By: Valentino Saxon M.D.   On: 03/22/2022 16:47   CT Head Wo Contrast  Result Date: 03/22/2022 CLINICAL DATA:  Dizziness, persistent/recurrent, cardiac or vascular cause suspected EXAM: CT HEAD WITHOUT CONTRAST TECHNIQUE: Contiguous axial images were obtained from the base of the skull through the vertex without intravenous contrast. RADIATION DOSE REDUCTION: This exam was performed according to the departmental dose-optimization program which includes automated exposure control, adjustment of the mA and/or kV according to patient size and/or use of iterative reconstruction technique. COMPARISON:  Nov 17, 2004 FINDINGS: Brain: No evidence of acute infarction, hemorrhage, hydrocephalus, extra-axial collection or mass lesion/mass effect. Periventricular white matter hypodensities consistent with sequela of chronic microvascular ischemic disease. Vascular: No hyperdense vessel or unexpected calcification. Skull: No acute fracture. 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 Sinuses/Orbits: No acute finding.  Other: None. IMPRESSION: No acute intracranial abnormality. Electronically Signed   By: Valentino Saxon M.D.   On: 03/22/2022 16:14   DG Chest 1 View  Result Date: 03/22/2022 CLINICAL DATA:  Dizziness and right-sided pain at night. Intermittent fevers and unexpected weight loss. EXAM: CHEST  1 VIEW COMPARISON:  Chest radiograph 12/29/2010, 12/13/2007 FINDINGS: Borderline enlargement of the cardiac silhouette, which could exaggerated secondary to portable technique. Subtle interstitial thickening in the lower lungs. No pleural effusion or pneumothorax. Visualized osseous structures are unremarkable. IMPRESSION: Subtle interstitial  thickening in the lower lungs may represent subsegmental atelectasis or possibly mild pulmonary edema. No lobar consolidation or pleural effusion. Electronically Signed   By: Ileana Roup M.D.   On: 03/22/2022 13:51

## 2022-06-08 NOTE — Telephone Encounter (Signed)
Request for Intrathecal MTX appt was faxed to IR

## 2022-06-08 NOTE — Assessment & Plan Note (Signed)
Due to lymphoma. He has gained weight.  Encourage nutritional supplementation.

## 2022-06-08 NOTE — Assessment & Plan Note (Addendum)
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  PET scan was reviewed and discussed with patient.- liver, spleen, possible  thyroid,  involvements.  Marrow is negative on PET.  bone marrow biopsy is negative.   IPI score is 4. CNS IPI - 4 Interim PET showed CR.  Labs are reviewed and discussed with patient. Proceed with cycle 3 R-Pola -CHP with GCSF - IT MTX/hydrocortisone.  Recommend patient to take D2-5 prednisone continue allopurinol and acyclovir.

## 2022-06-08 NOTE — Patient Instructions (Signed)
[facility] CANCER CTR AT [place]  Discharge Instructions: Thank you for choosing [facility] to provide your oncology and hematology care.  If you have a lab appointment with the [facility], please go directly to the [facility] and check in at the registration area.  Wear comfortable clothing and clothing appropriate for easy access to any Portacath or PICC line.   We strive to give you quality time with your provider. You may need to reschedule your appointment if you arrive late (15 or more minutes).  Arriving late affects you and other patients whose appointments are after yours.  Also, if you miss three or more appointments without notifying the office, you may be dismissed from the clinic at the provider's discretion.      For prescription refill requests, have your pharmacy contact our office and allow 72 hours for refills to be completed.    Today you received the following chemotherapy and/or immunotherapy agents TRUXIMA      To help prevent nausea and vomiting after your treatment, we encourage you to take your nausea medication as directed.  BELOW ARE SYMPTOMS THAT SHOULD BE REPORTED IMMEDIATELY: *FEVER GREATER THAN 100.4 F (38 C) OR HIGHER *CHILLS OR SWEATING *NAUSEA AND VOMITING THAT IS NOT CONTROLLED WITH YOUR NAUSEA MEDICATION *UNUSUAL SHORTNESS OF BREATH *UNUSUAL BRUISING OR BLEEDING *URINARY PROBLEMS (pain or burning when urinating, or frequent urination) *BOWEL PROBLEMS (unusual diarrhea, constipation, pain near the anus) TENDERNESS IN MOUTH AND THROAT WITH OR WITHOUT PRESENCE OF ULCERS (sore throat, sores in mouth, or a toothache) UNUSUAL RASH, SWELLING OR PAIN  UNUSUAL VAGINAL DISCHARGE OR ITCHING   Items with * indicate a potential emergency and should be followed up as soon as possible or go to the Emergency Department if any problems should occur.  Please show the CHEMOTHERAPY ALERT CARD or IMMUNOTHERAPY ALERT CARD at check-in to the  Emergency Department and triage nurse.  Should you have questions after your visit or need to cancel or reschedule your appointment, please contact [facility] CANCER [place] AT [place]  [phone] and follow the prompts.  Office hours are 8:00 a.m. to 4:30 p.m. Monday - Friday. Please note that voicemails left after 4:00 p.m. may not be returned until the following business day.  We are closed weekends and major holidays. You have access to a nurse at all times for urgent questions. Please call the main number to the clinic [phone] and follow the prompts.  For any non-urgent questions, you may also contact your provider using MyChart. We now offer e-Visits for anyone 58 and older to request care online for non-urgent symptoms. For details visit [email or web address].   Also download the MyChart app! Go to the app store, search "MyChart", open the app, select [facility], and log in with your MyChart username and password.  Masks are optional in the cancer centers. If you would like for your care team to wear a mask while they are taking care of you, please let them know. For doctor visits, patients may have with them one support person who is at least 61 years old. At this time, visitors are not allowed in the infusion area.

## 2022-06-08 NOTE — Assessment & Plan Note (Signed)
Treatment plan as listed above. 

## 2022-06-09 ENCOUNTER — Other Ambulatory Visit: Payer: Self-pay

## 2022-06-09 ENCOUNTER — Inpatient Hospital Stay: Payer: 59

## 2022-06-09 VITALS — BP 122/74 | HR 90 | Temp 96.0°F | Resp 16

## 2022-06-09 DIAGNOSIS — C8338 Diffuse large B-cell lymphoma, lymph nodes of multiple sites: Secondary | ICD-10-CM

## 2022-06-09 DIAGNOSIS — Z5111 Encounter for antineoplastic chemotherapy: Secondary | ICD-10-CM | POA: Diagnosis not present

## 2022-06-09 MED ORDER — PALONOSETRON HCL INJECTION 0.25 MG/5ML
0.2500 mg | Freq: Once | INTRAVENOUS | Status: AC
  Administered 2022-06-09: 0.25 mg via INTRAVENOUS
  Filled 2022-06-09: qty 5

## 2022-06-09 MED ORDER — SODIUM CHLORIDE 0.9 % IV SOLN
1.8000 mg/kg | Freq: Once | INTRAVENOUS | Status: AC
  Administered 2022-06-09: 140 mg via INTRAVENOUS
  Filled 2022-06-09: qty 7

## 2022-06-09 MED ORDER — ACETAMINOPHEN 325 MG PO TABS
650.0000 mg | ORAL_TABLET | Freq: Once | ORAL | Status: AC
  Administered 2022-06-09: 650 mg via ORAL
  Filled 2022-06-09: qty 2

## 2022-06-09 MED ORDER — DIPHENHYDRAMINE HCL 25 MG PO CAPS
50.0000 mg | ORAL_CAPSULE | Freq: Once | ORAL | Status: AC
  Administered 2022-06-09: 50 mg via ORAL
  Filled 2022-06-09: qty 2

## 2022-06-09 MED ORDER — SODIUM CHLORIDE 0.9% FLUSH
10.0000 mL | INTRAVENOUS | Status: DC | PRN
  Administered 2022-06-09: 10 mL
  Filled 2022-06-09: qty 10

## 2022-06-09 MED ORDER — HEPARIN SOD (PORK) LOCK FLUSH 100 UNIT/ML IV SOLN
500.0000 [IU] | Freq: Once | INTRAVENOUS | Status: AC | PRN
  Administered 2022-06-09: 500 [IU]
  Filled 2022-06-09: qty 5

## 2022-06-09 MED ORDER — SODIUM CHLORIDE 0.9 % IV SOLN
750.0000 mg/m2 | Freq: Once | INTRAVENOUS | Status: AC
  Administered 2022-06-09: 1400 mg via INTRAVENOUS
  Filled 2022-06-09: qty 70

## 2022-06-09 MED ORDER — SODIUM CHLORIDE 0.9 % IV SOLN
150.0000 mg | Freq: Once | INTRAVENOUS | Status: AC
  Administered 2022-06-09: 150 mg via INTRAVENOUS
  Filled 2022-06-09: qty 150

## 2022-06-09 MED ORDER — SODIUM CHLORIDE 0.9 % IV SOLN
10.0000 mg | Freq: Once | INTRAVENOUS | Status: AC
  Administered 2022-06-09: 10 mg via INTRAVENOUS
  Filled 2022-06-09: qty 10

## 2022-06-09 MED ORDER — DOXORUBICIN HCL CHEMO IV INJECTION 2 MG/ML
50.0000 mg/m2 | Freq: Once | INTRAVENOUS | Status: AC
  Administered 2022-06-09: 94 mg via INTRAVENOUS
  Filled 2022-06-09: qty 47

## 2022-06-09 MED ORDER — SODIUM CHLORIDE 0.9 % IV SOLN
Freq: Once | INTRAVENOUS | Status: AC
  Filled 2022-06-09: qty 250

## 2022-06-09 NOTE — Patient Instructions (Signed)
[facility] CANCER CTR AT [place]  Discharge Instructions: Thank you for choosing [place] to provide your oncology and hematology care.  If you have a lab appointment with the [facility], please go directly to the [facility] and check in at the registration area.  Wear comfortable clothing and clothing appropriate for easy access to any Portacath or PICC line.   We strive to give you quality time with your provider. You may need to reschedule your appointment if you arrive late (15 or more minutes).  Arriving late affects you and other patients whose appointments are after yours.  Also, if you miss three or more appointments without notifying the office, you may be dismissed from the clinic at the provider's discretion.      For prescription refill requests, have your pharmacy contact our office and allow 72 hours for refills to be completed.    Today you received the following chemotherapy and/or immunotherapy agents polivy/adriamycin/cytoxan    To help prevent nausea and vomiting after your treatment, we encourage you to take your nausea medication as directed.  BELOW ARE SYMPTOMS THAT SHOULD BE REPORTED IMMEDIATELY: *FEVER GREATER THAN 100.4 F (38 C) OR HIGHER *CHILLS OR SWEATING *NAUSEA AND VOMITING THAT IS NOT CONTROLLED WITH YOUR NAUSEA MEDICATION *UNUSUAL SHORTNESS OF BREATH *UNUSUAL BRUISING OR BLEEDING *URINARY PROBLEMS (pain or burning when urinating, or frequent urination) *BOWEL PROBLEMS (unusual diarrhea, constipation, pain near the anus) TENDERNESS IN MOUTH AND THROAT WITH OR WITHOUT PRESENCE OF ULCERS (sore throat, sores in mouth, or a toothache) UNUSUAL RASH, SWELLING OR PAIN  UNUSUAL VAGINAL DISCHARGE OR ITCHING   Items with * indicate a potential emergency and should be followed up as soon as possible or go to the Emergency Department if any problems should occur.  Please show the CHEMOTHERAPY ALERT CARD or IMMUNOTHERAPY ALERT CARD at  check-in to the Emergency Department and triage nurse.  Should you have questions after your visit or need to cancel or reschedule your appointment, please contact [facility] CANCER [place] AT [place]  [phone] and follow the prompts.  Office hours are 8:00 a.m. to 4:30 p.m. Monday - Friday. Please note that voicemails left after 4:00 p.m. may not be returned until the following business day.  We are closed weekends and major holidays. You have access to a nurse at all times for urgent questions. Please call the main number to the clinic [phone] and follow the prompts.  For any non-urgent questions, you may also contact your provider using MyChart. We now offer e-Visits for anyone 70 and older to request care online for non-urgent symptoms. For details visit [email or web address].   Also download the MyChart app! Go to the app store, search "MyChart", open the app, select [facility], and log in with your MyChart username and password.  Masks are optional in the cancer centers. If you would like for your care team to wear a mask while they are taking care of you, please let them know. For doctor visits, patients may have with them one support person who is at least 61 years old. At this time, visitors are not allowed in the infusion area.

## 2022-06-10 ENCOUNTER — Encounter: Payer: Self-pay | Admitting: Oncology

## 2022-06-10 ENCOUNTER — Other Ambulatory Visit: Payer: Self-pay | Admitting: Oncology

## 2022-06-10 ENCOUNTER — Telehealth: Payer: Self-pay | Admitting: *Deleted

## 2022-06-10 DIAGNOSIS — C8338 Diffuse large B-cell lymphoma, lymph nodes of multiple sites: Secondary | ICD-10-CM

## 2022-06-10 NOTE — Telephone Encounter (Signed)
scheduled for Thurs 1/11 at [place] and arrive at 8:30a

## 2022-06-10 NOTE — Telephone Encounter (Signed)
Patient called reporting that after his chemotherapy yesterday, his sugar was 495. He has it down to 200 and something and he is asking if he is ok to come for his treatment tomorrow or not. Please advise

## 2022-06-10 NOTE — Telephone Encounter (Signed)
Call returned to patient informed of doctor response. He states he is drinking 10 bottles of water per day and his last BS was 137

## 2022-06-11 ENCOUNTER — Inpatient Hospital Stay: Payer: 59

## 2022-06-11 ENCOUNTER — Other Ambulatory Visit: Payer: Self-pay

## 2022-06-11 ENCOUNTER — Ambulatory Visit
Admission: RE | Admit: 2022-06-11 | Discharge: 2022-06-11 | Disposition: A | Discharge status: Home / Self Care | Payer: 59 | Source: Ambulatory Visit | Attending: Oncology | Admitting: Oncology

## 2022-06-11 ENCOUNTER — Ambulatory Visit: Payer: 59

## 2022-06-11 DIAGNOSIS — Z5111 Encounter for antineoplastic chemotherapy: Secondary | ICD-10-CM | POA: Diagnosis not present

## 2022-06-11 DIAGNOSIS — C8330 Diffuse large B-cell lymphoma, unspecified site: Secondary | ICD-10-CM | POA: Diagnosis not present

## 2022-06-11 DIAGNOSIS — C8338 Diffuse large B-cell lymphoma, lymph nodes of multiple sites: Secondary | ICD-10-CM | POA: Diagnosis not present

## 2022-06-11 MED ORDER — PEGFILGRASTIM INJECTION 6 MG/0.6ML ~~LOC~~
6.0000 mg | PREFILLED_SYRINGE | Freq: Once | SUBCUTANEOUS | Status: AC
  Administered 2022-06-11: 6 mg via SUBCUTANEOUS
  Filled 2022-06-11: qty 0.6

## 2022-06-11 MED ORDER — SODIUM CHLORIDE (PF) 0.9 % IJ SOLN
Freq: Once | INTRAMUSCULAR | Status: DC
  Filled 2022-06-11 (×2): qty 0.48

## 2022-06-11 MED ORDER — ACETAMINOPHEN 325 MG PO TABS: 650.0000 mg | ORAL_TABLET | ORAL | Status: DC | PRN

## 2022-06-11 NOTE — Discharge Instructions (Signed)
Lumbar Puncture, Care After Refer to this sheet in the next few weeks. These instructions provide you with information on caring for yourself after your procedure. Your health care provider may also give you more specific instructions. Your treatment has been planned according to current medical practices, but problems sometimes occur. Call your health care provider if you have any problems or questions after your procedure. What can I expect after the procedure? After your procedure, it is typical to have the following sensations: Mild discomfort or pain at the insertion site. Mild headache that is relieved with pain medicines.  Follow these instructions at home:  Avoid lifting anything heavier than 10 lb (4.5 kg) for at least 12 hours after the procedure. Drink enough fluids to keep your urine clear or pale yellow. Lay flat or as flat as possible for the remainder of the day. Contact a health care provider if: You have fever or chills. You have nausea or vomiting. You have a headache that lasts for more than 2 days. Get help right away if: You have any numbness or tingling in your legs. You are unable to control your bowel or bladder. You have bleeding or swelling in your back at the insertion site. You are dizzy or faint. This information is not intended to replace advice given to you by your health care provider. Make sure you discuss any questions you have with your health care provider. Document Released: 06/13/2013 Document Revised: 11/14/2015 Document Reviewed: 02/14/2013 Elsevier Interactive Patient Education  2017 [employer] 

## 2022-06-11 NOTE — Progress Notes (Signed)
Patient taken via wheelchair to cancer center for further scheduled treatment.

## 2022-06-16 ENCOUNTER — Other Ambulatory Visit: Payer: 59

## 2022-06-16 ENCOUNTER — Inpatient Hospital Stay (HOSPITAL_BASED_OUTPATIENT_CLINIC_OR_DEPARTMENT_OTHER): Payer: 59 | Admitting: Medical Oncology

## 2022-06-16 ENCOUNTER — Encounter: Payer: Self-pay | Admitting: Medical Oncology

## 2022-06-16 ENCOUNTER — Inpatient Hospital Stay: Payer: 59

## 2022-06-16 ENCOUNTER — Other Ambulatory Visit: Payer: Self-pay

## 2022-06-16 VITALS — BP 119/80 | HR 91 | Temp 97.4°F | Resp 20 | Ht 66.0 in | Wt 174.0 lb

## 2022-06-16 DIAGNOSIS — R739 Hyperglycemia, unspecified: Secondary | ICD-10-CM | POA: Diagnosis not present

## 2022-06-16 DIAGNOSIS — E860 Dehydration: Secondary | ICD-10-CM

## 2022-06-16 DIAGNOSIS — R630 Anorexia: Secondary | ICD-10-CM | POA: Diagnosis not present

## 2022-06-16 DIAGNOSIS — Z5111 Encounter for antineoplastic chemotherapy: Secondary | ICD-10-CM | POA: Diagnosis not present

## 2022-06-16 DIAGNOSIS — C8338 Diffuse large B-cell lymphoma, lymph nodes of multiple sites: Secondary | ICD-10-CM

## 2022-06-16 LAB — CBC WITH DIFFERENTIAL/PLATELET
Abs Immature Granulocytes: 0.08 10*3/uL — ABNORMAL HIGH (ref 0.00–0.07)
Basophils Absolute: 0 10*3/uL (ref 0.0–0.1)
Basophils Relative: 0 %
Eosinophils Absolute: 0.3 10*3/uL (ref 0.0–0.5)
Eosinophils Relative: 3 %
HCT: 33.7 % — ABNORMAL LOW (ref 39.0–52.0)
Hemoglobin: 11.8 g/dL — ABNORMAL LOW (ref 13.0–17.0)
Immature Granulocytes: 1 %
Lymphocytes Relative: 6 %
Lymphs Abs: 0.6 10*3/uL — ABNORMAL LOW (ref 0.7–4.0)
MCH: 29.8 pg (ref 26.0–34.0)
MCHC: 35 g/dL (ref 30.0–36.0)
MCV: 85.1 fL (ref 80.0–100.0)
Monocytes Absolute: 0.6 10*3/uL (ref 0.1–1.0)
Monocytes Relative: 6 %
Neutro Abs: 8.8 10*3/uL — ABNORMAL HIGH (ref 1.7–7.7)
Neutrophils Relative %: 84 %
Platelets: 161 10*3/uL (ref 150–400)
RBC: 3.96 MIL/uL — ABNORMAL LOW (ref 4.22–5.81)
RDW: 17.7 % — ABNORMAL HIGH (ref 11.5–15.5)
Smear Review: NORMAL
WBC: 10.4 10*3/uL (ref 4.0–10.5)
nRBC: 0 % (ref 0.0–0.2)

## 2022-06-16 LAB — COMPREHENSIVE METABOLIC PANEL
ALT: 26 U/L (ref 0–44)
AST: 24 U/L (ref 15–41)
Albumin: 4 g/dL (ref 3.5–5.0)
Alkaline Phosphatase: 110 U/L (ref 38–126)
Anion gap: 6 (ref 5–15)
BUN: 24 mg/dL — ABNORMAL HIGH (ref 8–23)
CO2: 24 mmol/L (ref 22–32)
Calcium: 9.1 mg/dL (ref 8.9–10.3)
Chloride: 105 mmol/L (ref 98–111)
Creatinine, Ser: 0.77 mg/dL (ref 0.61–1.24)
GFR, Estimated: >60 mL/min (ref >60)
Glucose, Bld: 162 mg/dL — ABNORMAL HIGH (ref 70–99)
Potassium: 3.9 mmol/L (ref 3.5–5.1)
Sodium: 135 mmol/L (ref 135–145)
Total Bilirubin: 0.4 mg/dL (ref 0.3–1.2)
Total Protein: 6.4 g/dL — ABNORMAL LOW (ref 6.5–8.1)

## 2022-06-16 MED ORDER — SODIUM CHLORIDE 0.9 % IV SOLN
INTRAVENOUS | Status: DC
  Filled 2022-06-16 (×2): qty 250

## 2022-06-16 MED ORDER — HEPARIN SOD (PORK) LOCK FLUSH 100 UNIT/ML IV SOLN
500.0000 [IU] | Freq: Once | INTRAVENOUS | Status: AC
  Administered 2022-06-16: 500 [IU] via INTRAVENOUS
  Filled 2022-06-16: qty 5

## 2022-06-16 MED ORDER — SODIUM CHLORIDE 0.9% FLUSH
10.0000 mL | Freq: Once | INTRAVENOUS | Status: AC
  Administered 2022-06-16: 10 mL via INTRAVENOUS
  Filled 2022-06-16: qty 10

## 2022-06-16 NOTE — Progress Notes (Signed)
New med: Patient's pcp prescribe lantus-sliding scale due to hyperglycemia

## 2022-06-16 NOTE — Progress Notes (Signed)
Symptom Management [place] at [facility] Telephone:([phone]) [phone] Fax:(336) [phone]  Patient Care Team: Ranae Plumber, [place] as PCP - General (Family Medicine) Ranae Plumber, [place] (Family Medicine) Christene Lye, MD (General Surgery) Clent Jacks, RN as Oncology Nurse Navigator   Name of the patient: Joe Adams  [id number]  01-22-1961   Date of visit: 06/16/22  Reason for Consult: Joe Adams is a 61 y.o. 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 Last treatment was on 06/09/2022. Was diagnosed with worsening hyperglycemia thought to be secondary to his steroids. His PCP started him on lantus which he is taking on a sliding scale at night. He reports that since being started on insulin his glucose values have gone from the 400s to the 100-200s. Other than some fatigue, a bit of a headache without visual changes, and a bit of anxiety from the steroids he is doing well. Eating/drinking ok. No rash, muscle aches, urinary changes. Had a good Christmas.    Denies any neurologic complaints. Denies recent fevers or illnesses. Denies any easy bleeding or bruising. Reports good appetite and denies weight loss. Denies chest pain. Denies any nausea, vomiting, constipation, or diarrhea. Denies urinary complaints. Patient offers no further specific complaints today.  PAST MEDICAL HISTORY: Past Medical History:  Diagnosis Date   Acute MI ([place]) 2005   Diabetes mellitus without complication (HCC)    GERD (gastroesophageal reflux disease)    Headache    Hernia, umbilical    History of kidney stones    Large cell lymphoma ([place]) 04/03/2022   Thyroid disease     PAST SURGICAL HISTORY:  Past Surgical History:  Procedure Laterality Date   COLONOSCOPY N/A 11/18/2016   Procedure: COLONOSCOPY IN O.R;  Surgeon: Christene Lye, MD;   Location: [facility] ORS;  Service: General;  Laterality: N/A;   COLONOSCOPY WITH PROPOFOL N/A 11/18/2016   Procedure: COLONOSCOPY WITH PROPOFOL;  Surgeon: Christene Lye, MD;  Location: [facility] ENDOSCOPY;  Service: Endoscopy;  Laterality: N/A;   HEMORRHOID SURGERY N/A 11/18/2016   Procedure: HEMORRHOIDECTOMY;  Surgeon: Christene Lye, MD;  Location: [facility] ORS;  Service: General;  Laterality: N/A;   IR IMAGING GUIDED PORT INSERTION  04/20/2022   TONSILLECTOMY     as a child    HEMATOLOGY/ONCOLOGY HISTORY:  Oncology History  Diffuse large B cell lymphoma ([place])  03/22/2022 Imaging   CT chest angiogram, CT abdomen pelvis w contrast  1. No acute pulmonary embolism. 2. There are multiple heterogeneously enhancing masses throughout the liver, several which demonstrate capsular retraction. No definitive extrahepatic primary is identified. Findings could reflect multifocal hepatocellular carcinoma/cholangiocarcinoma, lymphoma or abscess. Recommend correlation with blood chemistry values. Liver MRI with and without contrast could be of assistance in further characterizing these masses. 3. Splenomegaly.4. Multiple borderline enlarged lymph nodes above and below the diaphragm. 5. Homogeneously enlarged thyroid gland, nonspecific. Consider further dedicated evaluation with nonemergent thyroid ultrasound. 6. Mild bronchial wall thickening with bibasilar atelectasis. This could reflect a nonspecific bronchitis.   03/22/2022 Imaging   MRI liver w wo contrast 1. Bilobar hepatic lesions demonstrate irregular contour with minimally increased T2 signal, hypointense intrinsic T1 signal, postcontrast enhancement that is slowly increases and is most prominent in intensities 5 minutes postcontrast administration as well as a peripheral rim of reduced diffusivity.  2. Enlarged upper abdominal lymph nodes, likely reflect metastatic nodal disease involvement. 3. Enhancing lesions in  the right tenth rib and L2  vertebral body,concerning for osseous metastatic disease. 4. Prominent subcentimeter pericardiophrenic and internal mammary lymph nodes also suspicious for nodal metastatic disease.    03/31/2022 Cancer Staging   Staging form: Hodgkin and Non-Hodgkin Lymphoma, AJCC 8th Edition - Clinical stage from 03/31/2022: Stage IV (Diffuse large B-cell lymphoma) - Signed by [name], MD on 04/19/2022 Stage prefix: Initial diagnosis International Prognostic Index (IPI) score: Score 4 International Prognostic Index risk group: High risk DLBCL cell of origin (COO): Activated B-cell   04/03/2022 Initial Diagnosis   Large cell lymphoma ([place])  04/03/2022, ultrasound-guided liver biopsy showed large B-cell lymphoma.  Additional IHC is pending for subclassification as well as ancillary FISH testing for rearrangementds   04/16/2022 Imaging   PET initial staging showed 1. Multiple bulky intensely hypermetabolic liver masses throughout both liver lobes, compatible with reported lymphoma. Deauville score 5. 2. Mild splenomegaly with mild splenic hypermetabolism, compatible with lymphoma. No enlarged or hypermetabolic lymph nodes. No hypermetabolic skeletal activity. 3. 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 4. Chronic findings include: Aortic Atherosclerosis.Mild sigmoid diverticulosis.     04/20/2022 Procedure   Medi port placed by IR   04/22/2022 -  Chemotherapy   Patient is on Treatment Plan : NON-HODGKINS LYMPHOMA R-POLA CHP q21d     06/05/2022 Imaging   Restaging PET scan showed -PET-CT findings consistent with a complete metabolic response. No residual hypermetabolism involving the liver (Deauville 1). All measurable liver lesions are much smaller.   Mild residual FDG uptake in the thyroid gland. This could be due to treated thyroiditis. Treated thyroid gland involvement with lymphoma is also possible (Deauville 2).      ALLERGIES:   has No Known Allergies.  MEDICATIONS:  Current Outpatient Medications  Medication Sig Dispense Refill   Acetaminophen (TYLENOL EXTRA STRENGTH PO) Take 1 tablet by mouth every 6 (six) hours as needed (constipation).     acyclovir (ZOVIRAX) 400 MG tablet Take 1 tablet (400 mg total) by mouth daily. 30 tablet 3   allopurinol (ZYLOPRIM) 100 MG tablet Take 1 tablet (100 mg total) by mouth daily. 30 tablet 1   Docusate Calcium (STOOL SOFTENER PO) Take by mouth.     Glucose Blood (TRUE METRIX BLOOD GLUCOSE TEST VI) as directed.     LANTUS SOLOSTAR 100 UNIT/ML Solostar Pen Inject into the skin. Pt is on a sliding scale; 20 units if blood glucose is at 200; takes 25 units if blood glucose on is 225     levothyroxine (SYNTHROID) 150 MCG tablet Take 150 mcg by mouth daily.     lidocaine-prilocaine (EMLA) cream Apply to affected area once 30 g 3   loratadine (CLARITIN) 10 MG tablet Take 10 mg by mouth daily.     metFORMIN (GLUCOPHAGE) 1000 MG tablet 2 (two) times daily.     ondansetron (ZOFRAN) 8 MG tablet Take 1 tablet (8 mg total) by mouth every 8 (eight) hours as needed for nausea or vomiting. Start on the third day after cyclophosphamide chemotherapy. 30 tablet 1   predniSONE (DELTASONE) 20 MG tablet Take 5 tablets (100 mg total) by mouth daily. Take with food on days 2-5 of chemotherapy. 20 tablet 5   TRUEPLUS 5-BEVEL PEN NEEDLES 31G X 6 MM MISC      HYDROcodone-acetaminophen (NORCO/VICODIN) 5-325 MG tablet Take 1 tablet by mouth every 6 (six) hours as needed. (Patient not taking: Reported on 06/16/2022)     lactulose (CHRONULAC) 10 GM/15ML solution Take  15 mLs (10 g total) by mouth daily as needed for mild constipation. (Patient not taking: Reported on 06/16/2022) 236 mL 0   oxyCODONE-acetaminophen (PERCOCET) 5-325 MG tablet Take 1 tablet by mouth every 6 (six) hours as needed for severe pain. (Patient not taking: Reported on 06/16/2022) 60 tablet 0   prochlorperazine (COMPAZINE) 10 MG tablet Take 1  tablet (10 mg total) by mouth every 6 (six) hours as needed for nausea or vomiting. (Patient not taking: Reported on 06/16/2022) 30 tablet 6   No current facility-administered medications for this visit.   Facility-Administered Medications Ordered in Other Visits  Medication Dose Route Frequency Provider Last Rate Last Admin   0.9 %  sodium chloride infusion   Intravenous Continuous Hughie Closs, [place] 999 mL/hr at 06/16/22 0959 New Bag at 06/16/22 0959   heparin lock flush 100 unit/mL  500 Units Intravenous Once [name], MD       sodium chloride flush (NS) 0.9 % injection 10 mL  10 mL Intravenous Once [name], MD        VITAL SIGNS: BP 119/80   Pulse 91   Temp (!) 97.4 F (36.3 C) (Tympanic)   Resp 20   Ht '5\' 6"'$  (1.676 m)   Wt 174 lb (78.9 kg)   BMI 28.08 kg/m  Filed Weights   06/16/22 0936  Weight: 174 lb (78.9 kg)    Estimated body mass index is 28.08 kg/m as calculated from the following:   Height as of this encounter: '5\' 6"'$  (1.676 m).   Weight as of this encounter: 174 lb (78.9 kg).  LABS: CBC:    Component Value Date/Time   WBC 10.4 06/16/2022 0904   HGB 11.8 (L) 06/16/2022 0904   HCT 33.7 (L) 06/16/2022 0904   PLT 161 06/16/2022 0904   MCV 85.1 06/16/2022 0904   NEUTROABS PENDING 06/16/2022 0904   LYMPHSABS PENDING 06/16/2022 0904   MONOABS PENDING 06/16/2022 0904   EOSABS PENDING 06/16/2022 0904   BASOSABS PENDING 06/16/2022 0904   Comprehensive Metabolic Panel:    Component Value Date/Time   NA 135 06/16/2022 0904   K 3.9 06/16/2022 0904   CL 105 06/16/2022 0904   CO2 24 06/16/2022 0904   BUN 24 (H) 06/16/2022 0904   CREATININE 0.77 06/16/2022 0904   GLUCOSE 162 (H) 06/16/2022 0904   CALCIUM 9.1 06/16/2022 0904   AST 24 06/16/2022 0904   ALT 26 06/16/2022 0904   ALKPHOS 110 06/16/2022 0904   BILITOT 0.4 06/16/2022 0904   PROT 6.4 (L) 06/16/2022 0904   ALBUMIN 4.0 06/16/2022 0904    RADIOGRAPHIC STUDIES: DG FLUORO GUIDED LOC OF  NEEDLE/CATH TIP FOR SPINAL INJECT LT  Result Date: 06/11/2022 CLINICAL DATA:  Diffuse large B-cell lymphoma. EXAM: FLUOROSCOPICALLY GUIDED LUMBAR PUNCTURE FOR INTRATHECAL CHEMOTHERAPY. COMPARISON:  Lumbar puncture 05/21/2022. FLUOROSCOPY: Radiation Exposure Index (as provided by the fluoroscopic device): 0.6 mGy mGy Kerma PROCEDURE: Informed consent was obtained from the patient prior to the procedure, including potential complications of headache, allergy, and pain. A 'time out' was performed. With the patient prone, the lower back was prepped with Betadine. 1% Lidocaine was used for local anesthesia. Lumbar puncture was performed at the L3-L4 level using a 20 gauge needle with return of clear CSF. 5 cc clear CSF was withdrawn. 12 mg of methotrexate was then slowly injected into the subarachnoid space. The patient tolerated the procedure well without apparent complication. IMPRESSION: Successful fluoroscopically guided lumbar puncture at L3-L4 with intrathecal injection of chemotherapy  without complication. Electronically Signed   By: Valetta Mole M.D.   On: 06/11/2022 15:12   NM PET Image Restage (PS) Skull Base to Thigh (F-18 FDG)  Result Date: 06/07/2022 CLINICAL DATA:  Subsequent treatment strategy for large B-cell lymphoma. EXAM: NUCLEAR MEDICINE PET SKULL BASE TO THIGH TECHNIQUE: 9.56 mCi F-18 FDG was injected intravenously. Full-ring PET imaging was performed from the skull base to thigh after the radiotracer. CT data was obtained and used for attenuation correction and anatomic localization. Fasting blood glucose: 246 mg/dl COMPARISON:  CT abdomen/pelvis 04/29/2022 and PET-CT 04/16/2022 FINDINGS: Mediastinal blood pool activity: SUV max 1.61 Liver activity: SUV max 2.79 NECK: No hypermetabolic lymph nodes in the neck. Mild residual hypermetabolism in the thyroid gland. SUV max is 2.72 and was previously 25.07. Incidental CT findings: None. CHEST: No hypermetabolic mediastinal or hilar nodes. No  suspicious pulmonary nodules on the CT scan. Incidental CT findings: Scattered atherosclerotic calcifications involving the aorta. Right IJ Port-A-Cath in good position. ABDOMEN/PELVIS: Complete metabolic response involving the liver. 2.5 cm residual low-attenuation lesion in segment 7 previously measured 5.4 cm. No residual hypermetabolism. 6 cm residual low-attenuation lesion in segment 5. This previously measured 9 cm. No residual hypermetabolism. 5.5 cm low-attenuation lesion in segment 6 previously measured 10 cm. No residual hypermetabolism. No enlarged or hypermetabolic mesenteric, retroperitoneal or pelvic lymph nodes. The spleen is upper limits of normal in size. No splenic lesions. Incidental CT findings: Stable scattered aortic calcifications. Scattered colonic diverticulosis. SKELETON: No findings suspicious for osseous lymphoma. Mild diffuse marrow activity likely rebound from chemotherapy or due to marrow stimulating drugs. Incidental CT findings: None. IMPRESSION: PET-CT findings consistent with a complete metabolic response. No residual hypermetabolism involving the liver (Deauville 1). All measurable liver lesions are much smaller. Mild residual FDG uptake in the thyroid gland. This could be due to treated thyroiditis. Treated thyroid gland involvement with lymphoma is also possible (Deauville 2). Electronically Signed   By: Marijo Sanes M.D.   On: 06/07/2022 13:08   DG FLUORO GUIDED LOC OF NEEDLE/CATH TIP FOR SPINAL INJECT LT  Result Date: 05/21/2022 CLINICAL DATA:  Patient with diffuse large B-cell lymphoma of lymph nodes of multiple regions EXAM: FLUOROSCOPICALLY GUIDED LUMBAR PUNCTURE FOR INTRATHECAL CHEMOTHERAPY TECHNIQUE: This exam was performed by Reatha Armour, PA-C and supervised and interpreted by Dr Valetta Mole. Informed consent was obtained from the patient prior to the procedure, including potential complications of headache, allergy, and pain. A 'time out' was performed. With the  patient prone, the lower back was prepped with Betadine. 1% Lidocaine was used for local anesthesia. Lumbar puncture was performed at the L3-L4 level using a 20 gauge needle with return of clear CSF. Opening pressure of 14 cm H2O. 7.5 mL of clear CSF collected for laboratory studies. 12 mg of methotrexate was slowly injected into the subarachnoid space. The patient tolerated the procedure well without apparent complication. FLUOROSCOPY: 9.70 mGy Kerma IMPRESSION: 1.  Technically successful lumbar puncture at L3-L4 level 2. Opening pressure of 14 cm H2O with 7.5 mL of clear CSF collected for laboratory study. 3.  Intrathecal injection of chemotherapy without complication Electronically Signed   By: Valetta Mole M.D.   On: 05/21/2022 11:39    PERFORMANCE STATUS (ECOG) : 1 - Symptomatic but completely ambulatory  Review of Systems Unless otherwise noted, a complete review of systems is negative.  Physical Exam General: NAD Cardiovascular: regular rate and rhythm Pulmonary: clear ant fields Abdomen: soft, nontender, + bowel sounds GU: no suprapubic tenderness Extremities:  no edema, no joint deformities Skin: no rashes Neurological: Weakness but otherwise nonfocal  Assessment and Plan- Patient is a 61 y.o. male    Encounter Diagnoses  Name Primary?   Diffuse large B-cell lymphoma of lymph nodes of multiple regions (HCC) Yes   Dehydration    Hyperglycemia    Poor appetite    Lymphoma: Stage IV. On treatment. Follow up as planned on 06/29/2022 with Dr. Tasia Catchings.   Dehydration: After his last treatment his labs and vitals looked good so additional IVF were not given. He reports that he had more significant hyperglycemia later that day and asks for IVF following each cycle of treatment to help prevent this. IVF planned for today  Hyperglycemia: Chronic and improved now that he is on latus. Continue PCP management and daily monitoring. See above  Poor Appetite: Chronic. Appetite is improving- weight  keeps dropping. Nutrition consult submitted.  Wt Readings from Last 3 Encounters:  06/16/22 174 lb (78.9 kg)  06/08/22 179 lb 14.4 oz (81.6 kg)  05/25/22 178 lb (80.7 kg)    Patient expressed understanding and was in agreement with this plan. He also understands that He can call clinic at any time with any questions, concerns, or complaints.   Thank you for allowing me to participate in the care of this very pleasant patient.   Time Total: 25  Visit consisted of counseling and education dealing with the complex and emotionally intense issues of symptom management in the setting of serious illness.Greater than 50%  of this time was spent counseling and coordinating care related to the above assessment and plan.  Signed by: Nelwyn Salisbury, PA-C

## 2022-06-29 ENCOUNTER — Inpatient Hospital Stay: Payer: 59 | Attending: Oncology | Admitting: Oncology

## 2022-06-29 ENCOUNTER — Encounter: Payer: Self-pay | Admitting: Oncology

## 2022-06-29 ENCOUNTER — Inpatient Hospital Stay: Payer: 59

## 2022-06-29 VITALS — BP 125/78 | HR 87 | Temp 98.1°F | Resp 18

## 2022-06-29 VITALS — BP 129/90 | HR 99 | Temp 98.0°F | Wt 184.5 lb

## 2022-06-29 DIAGNOSIS — Z5111 Encounter for antineoplastic chemotherapy: Secondary | ICD-10-CM | POA: Diagnosis present

## 2022-06-29 DIAGNOSIS — Z5112 Encounter for antineoplastic immunotherapy: Secondary | ICD-10-CM | POA: Insufficient documentation

## 2022-06-29 DIAGNOSIS — C8339 Diffuse large B-cell lymphoma, extranodal and solid organ sites: Secondary | ICD-10-CM | POA: Insufficient documentation

## 2022-06-29 DIAGNOSIS — Z79899 Other long term (current) drug therapy: Secondary | ICD-10-CM | POA: Diagnosis not present

## 2022-06-29 DIAGNOSIS — C8338 Diffuse large B-cell lymphoma, lymph nodes of multiple sites: Secondary | ICD-10-CM

## 2022-06-29 DIAGNOSIS — Z5189 Encounter for other specified aftercare: Secondary | ICD-10-CM | POA: Diagnosis not present

## 2022-06-29 LAB — CBC WITH DIFFERENTIAL/PLATELET
Abs Immature Granulocytes: 0.13 10*3/uL — ABNORMAL HIGH (ref 0.00–0.07)
Basophils Absolute: 0.1 10*3/uL (ref 0.0–0.1)
Basophils Relative: 1 %
Eosinophils Absolute: 0.2 10*3/uL (ref 0.0–0.5)
Eosinophils Relative: 2 %
HCT: 35.1 % — ABNORMAL LOW (ref 39.0–52.0)
Hemoglobin: 12.6 g/dL — ABNORMAL LOW (ref 13.0–17.0)
Immature Granulocytes: 2 %
Lymphocytes Relative: 12 %
Lymphs Abs: 1 10*3/uL (ref 0.7–4.0)
MCH: 30.7 pg (ref 26.0–34.0)
MCHC: 35.9 g/dL (ref 30.0–36.0)
MCV: 85.4 fL (ref 80.0–100.0)
Monocytes Absolute: 1.2 10*3/uL — ABNORMAL HIGH (ref 0.1–1.0)
Monocytes Relative: 14 %
Neutro Abs: 5.6 10*3/uL (ref 1.7–7.7)
Neutrophils Relative %: 69 %
Platelets: 178 10*3/uL (ref 150–400)
RBC: 4.11 MIL/uL — ABNORMAL LOW (ref 4.22–5.81)
RDW: 18.1 % — ABNORMAL HIGH (ref 11.5–15.5)
WBC: 8.2 10*3/uL (ref 4.0–10.5)
nRBC: 0 % (ref 0.0–0.2)

## 2022-06-29 LAB — COMPREHENSIVE METABOLIC PANEL
ALT: 19 U/L (ref 0–44)
AST: 26 U/L (ref 15–41)
Albumin: 4.1 g/dL (ref 3.5–5.0)
Alkaline Phosphatase: 101 U/L (ref 38–126)
Anion gap: 7 (ref 5–15)
BUN: 27 mg/dL — ABNORMAL HIGH (ref 8–23)
CO2: 22 mmol/L (ref 22–32)
Calcium: 8.6 mg/dL — ABNORMAL LOW (ref 8.9–10.3)
Chloride: 103 mmol/L (ref 98–111)
Creatinine, Ser: 1.01 mg/dL (ref 0.61–1.24)
GFR, Estimated: >60 mL/min (ref >60)
Glucose, Bld: 166 mg/dL — ABNORMAL HIGH (ref 70–99)
Potassium: 3.9 mmol/L (ref 3.5–5.1)
Sodium: 132 mmol/L — ABNORMAL LOW (ref 135–145)
Total Bilirubin: 0.5 mg/dL (ref 0.3–1.2)
Total Protein: 7 g/dL (ref 6.5–8.1)

## 2022-06-29 LAB — LACTATE DEHYDROGENASE: LDH: 145 U/L (ref 98–192)

## 2022-06-29 LAB — HEMOGLOBIN A1C
Hgb A1c MFr Bld: 7.4 % — ABNORMAL HIGH (ref 4.8–5.6)
Mean Plasma Glucose: 165.68 mg/dL

## 2022-06-29 MED ORDER — ACETAMINOPHEN 325 MG PO TABS
650.0000 mg | ORAL_TABLET | Freq: Once | ORAL | Status: AC
  Administered 2022-06-29: 650 mg via ORAL
  Filled 2022-06-29: qty 2

## 2022-06-29 MED ORDER — HEPARIN SOD (PORK) LOCK FLUSH 100 UNIT/ML IV SOLN
500.0000 [IU] | Freq: Once | INTRAVENOUS | Status: AC | PRN
  Administered 2022-06-29: 500 [IU]
  Filled 2022-06-29: qty 5

## 2022-06-29 MED ORDER — FAMOTIDINE IN NACL 20-0.9 MG/50ML-% IV SOLN
20.0000 mg | Freq: Once | INTRAVENOUS | Status: AC
  Administered 2022-06-29: 20 mg via INTRAVENOUS
  Filled 2022-06-29: qty 50

## 2022-06-29 MED ORDER — DIPHENHYDRAMINE HCL 25 MG PO CAPS
50.0000 mg | ORAL_CAPSULE | Freq: Once | ORAL | Status: AC
  Administered 2022-06-29: 50 mg via ORAL
  Filled 2022-06-29: qty 2

## 2022-06-29 MED ORDER — SODIUM CHLORIDE 0.9 % IV SOLN
375.0000 mg/m2 | Freq: Once | INTRAVENOUS | Status: AC
  Administered 2022-06-29: 700 mg via INTRAVENOUS
  Filled 2022-06-29: qty 20

## 2022-06-29 MED ORDER — SODIUM CHLORIDE 0.9 % IV SOLN
Freq: Once | INTRAVENOUS | Status: AC
  Filled 2022-06-29: qty 250

## 2022-06-29 MED FILL — Fosaprepitant Dimeglumine For IV Infusion 150 MG (Base Eq): INTRAVENOUS | Qty: 5 | Status: AC

## 2022-06-29 NOTE — Assessment & Plan Note (Signed)
Treatment plan as listed above. 

## 2022-06-29 NOTE — Assessment & Plan Note (Addendum)
Recommend patient to take calcium supplementation. 

## 2022-06-29 NOTE — Patient Instructions (Signed)
[facility] [place]  Discharge Instructions: Thank you for choosing [facility] to provide your oncology and hematology care.  If you have a lab appointment with the [facility], please go directly to the [facility] and check in at the registration area.  Wear comfortable clothing and clothing appropriate for easy access to any Portacath or PICC line.   We strive to give you quality time with your provider. You may need to reschedule your appointment if you arrive late (15 or more minutes).  Arriving late affects you and other patients whose appointments are after yours.  Also, if you miss three or more appointments without notifying the office, you may be dismissed from the clinic at the provider's discretion.      For prescription refill requests, have your pharmacy contact our office and allow 72 hours for refills to be completed.    Today you received the following chemotherapy and/or immunotherapy agents TRUXIMA       To help prevent nausea and vomiting after your treatment, we encourage you to take your nausea medication as directed.  BELOW ARE SYMPTOMS THAT SHOULD BE REPORTED IMMEDIATELY: *FEVER GREATER THAN 100.4 F (38 C) OR HIGHER *CHILLS OR SWEATING *NAUSEA AND VOMITING THAT IS NOT CONTROLLED WITH YOUR NAUSEA MEDICATION *UNUSUAL SHORTNESS OF BREATH *UNUSUAL BRUISING OR BLEEDING *URINARY PROBLEMS (pain or burning when urinating, or frequent urination) *BOWEL PROBLEMS (unusual diarrhea, constipation, pain near the anus) TENDERNESS IN MOUTH AND THROAT WITH OR WITHOUT PRESENCE OF ULCERS (sore throat, sores in mouth, or a toothache) UNUSUAL RASH, SWELLING OR PAIN  UNUSUAL VAGINAL DISCHARGE OR ITCHING   Items with * indicate a potential emergency and should be followed up as soon as possible or go to the Emergency Department if any problems should occur.  Please show the CHEMOTHERAPY ALERT CARD or IMMUNOTHERAPY ALERT CARD at check-in to  the Emergency Department and triage nurse.  Should you have questions after your visit or need to cancel or reschedule your appointment, please contact [facility] [place] [facility] [place]  [phone] and follow the prompts.  Office hours are 8:00 a.m. to 4:30 p.m. Monday - Friday. Please note that voicemails left after 4:00 p.m. may not be returned until the following business day.  We are closed weekends and major holidays. You have access to a nurse at all times for urgent questions. Please call the main number to the clinic [phone] and follow the prompts.  For any non-urgent questions, you may also contact your provider using MyChart. We now offer e-Visits for anyone 51 and older to request care online for non-urgent symptoms. For details visit [email or web address].   Also download the MyChart app! Go to the app store, search "MyChart", open the app, select [facility], and log in with your MyChart username and password.  Rituximab Injection What is this medication? RITUXIMAB (ri TUX i mab) treats leukemia and lymphoma. It works by blocking a protein that causes cancer cells to grow and multiply. This helps to slow or stop the spread of cancer cells. It may also be used to treat autoimmune conditions, such as arthritis. It works by slowing down an overactive immune system. It is a monoclonal antibody. This medicine may be used for other purposes; ask your health care provider or pharmacist if you have questions. COMMON BRAND NAME(S): RIABNI, Rituxan, RUXIENCE, truxima What should I tell my care team before I take this medication? They need to know if you have any of these conditions:  Chest pain Heart disease Immune system problems Infection, such as chickenpox, cold sores, hepatitis B, herpes Irregular heartbeat or rhythm Kidney disease Low blood counts, such as low white cells, platelets, red cells Lung disease Recent or upcoming vaccine An unusual or allergic reaction  to rituximab, other medications, foods, dyes, or preservatives Pregnant or trying to get pregnant Breast-feeding How should I use this medication? This medication is injected into a vein. It is given by a care team in a hospital or clinic setting. A special MedGuide will be given to you before each treatment. Be sure to read this information carefully each time. Talk to your care team about the use of this medication in children. While this medication may be prescribed for children as young as 6 months for selected conditions, precautions do apply. Overdosage: If you think you have taken too much of this medicine contact a poison control center or emergency room at once. NOTE: This medicine is only for you. Do not share this medicine with others. What if I miss a dose? Keep appointments for follow-up doses. It is important not to miss your dose. Call your care team if you are unable to keep an appointment. What may interact with this medication? Do not take this medication with any of the following: Live vaccines This medication may also interact with the following: Cisplatin This list may not describe all possible interactions. Give your health care provider a list of all the medicines, herbs, non-prescription drugs, or dietary supplements you use. Also tell them if you smoke, drink alcohol, or use illegal drugs. Some items may interact with your medicine. What should I watch for while using this medication? Your condition will be monitored carefully while you are receiving this medication. You may need blood work while taking this medication. This medication can cause serious infusion reactions. To reduce the risk your care team may give you other medications to take before receiving this one. Be sure to follow the directions from your care team. This medication may increase your risk of getting an infection. Call your care team for advice if you get a fever, chills, sore throat, or other  symptoms of a cold or flu. Do not treat yourself. Try to avoid being around people who are sick. Call your care team if you are around anyone with measles, chickenpox, or if you develop sores or blisters that do not heal properly. Avoid taking medications that contain aspirin, acetaminophen, ibuprofen, naproxen, or ketoprofen unless instructed by your care team. These medications may hide a fever. This medication may cause serious skin reactions. They can happen weeks to months after starting the medication. Contact your care team right away if you notice fevers or flu-like symptoms with a rash. The rash may be red or purple and then turn into blisters or peeling of the skin. You may also notice a red rash with swelling of the face, lips, or lymph nodes in your neck or under your arms. In some patients, this medication may cause a serious brain infection that may cause death. If you have any problems seeing, thinking, speaking, walking, or standing, tell your care team right away. If you cannot reach your care team, urgently seek another source of medical care. Talk to your care team if you may be pregnant. Serious birth defects can occur if you take this medication during pregnancy and for 12 months after the last dose. You will need a negative pregnancy test before starting this medication. Contraception is recommended while  taking this medication and for 12 months after the last dose. Your care team can help you find the option that works for you. Do not breastfeed while taking this medication and for at least 6 months after the last dose. What side effects may I notice from receiving this medication? Side effects that you should report to your care team as soon as possible: Allergic reactions or angioedema--skin rash, itching or hives, swelling of the face, eyes, lips, tongue, arms, or legs, trouble swallowing or breathing Bowel blockage--stomach cramping, unable to have a bowel movement or pass gas,  loss of appetite, vomiting Dizziness, loss of balance or coordination, confusion or trouble speaking Heart attack--pain or tightness in the chest, shoulders, arms, or jaw, nausea, shortness of breath, cold or clammy skin, feeling faint or lightheaded Heart rhythm changes--fast or irregular heartbeat, dizziness, feeling faint or lightheaded, chest pain, trouble breathing Infection--fever, chills, cough, sore throat, wounds that don't heal, pain or trouble when passing urine, general feeling of discomfort or being unwell Infusion reactions--chest pain, shortness of breath or trouble breathing, feeling faint or lightheaded Kidney injury--decrease in the amount of urine, swelling of the ankles, hands, or feet Liver injury--right upper belly pain, loss of appetite, nausea, light-colored stool, dark yellow or brown urine, yellowing skin or eyes, unusual weakness or fatigue Redness, blistering, peeling, or loosening of the skin, including inside the mouth Stomach pain that is severe, does not go away, or gets worse Tumor lysis syndrome (TLS)--nausea, vomiting, diarrhea, decrease in the amount of urine, dark urine, unusual weakness or fatigue, confusion, muscle pain or cramps, fast or irregular heartbeat, joint pain Side effects that usually do not require medical attention (report to your care team if they continue or are bothersome): Headache Joint pain Nausea Runny or stuffy nose Unusual weakness or fatigue This list may not describe all possible side effects. Call your doctor for medical advice about side effects. You may report side effects to FDA at 1-800-FDA-1088. Where should I keep my medication? This medication is given in a hospital or clinic. It will not be stored at home. NOTE: This sheet is a summary. It may not cover all possible information. If you have questions about this medicine, talk to your doctor, pharmacist, or health care provider.  2023 Elsevier/Gold Standard (2021-10-21  00:00:00)

## 2022-06-29 NOTE — Assessment & Plan Note (Signed)
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  PET scan was reviewed and discussed with patient.- liver, spleen, possible  thyroid,  involvements.  Marrow is negative on PET.  bone marrow biopsy is negative.   IPI score is 4. CNS IPI - 4 Interim PET showed CR.  Labs are reviewed and discussed with patient. Proceed with cycle 4 R-Pola -CHP with GCSF - IT MTX/hydrocortisone.  Recommend patient to take D2-5 prednisone continue allopurinol and acyclovir.

## 2022-06-29 NOTE — Progress Notes (Signed)
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  PET scan was reviewed and discussed with patient.- liver, spleen, possible  thyroid,  involvements.  Marrow is negative on PET.  bone marrow biopsy is negative.   IPI score is 4. CNS IPI - 4 Interim PET showed CR.  Labs are reviewed and discussed with patient. Proceed with cycle 4 R-Pola -CHP with GCSF - IT MTX/hydrocortisone.  Recommend patient to take D2-5 prednisone continue allopurinol and acyclovir.   Hypercalcemia Recommend patient to take calcium supplementation.   Encounter for antineoplastic chemotherapy Treatment plan as listed above  No orders of the defined types were placed in this encounter.  Follow up  3 weeks lab MD R- pola CHP IT MTX  D3 udenyca  All questions were answered. The patient knows to call the clinic with any problems, questions or concerns.  [name], MD, PhD [facility] Hematology Oncology 06/29/2022    CHIEF COMPLAINTS/REASON FOR VISIT:  Stage IV DLBCL  HISTORY OF PRESENTING ILLNESS:   Joe Adams is a  62 y.o.  male with PMH listed below was seen in consultation at the request of  [name], MD  for evaluation of liver mass.  03/22/2022, patient presented to emergency room for evaluation of right side lower chest/upper abdomen/flank pain, intermittent low-grade fever and chills,  night sweats and unintentional weight loss of 20 to 30 pounds over the past 6 months.  He finished a course of antibiotics a few months ago for unknown infection and symptom has not improved.  Oncology History  Diffuse large B cell lymphoma ([place])  03/22/2022 Imaging   CT chest angiogram, CT abdomen pelvis w contrast  1. No acute pulmonary embolism. 2. There are multiple heterogeneously enhancing masses throughout the liver, several which demonstrate capsular retraction. No definitive extrahepatic primary is identified. Findings could reflect multifocal hepatocellular carcinoma/cholangiocarcinoma, lymphoma or abscess. Recommend correlation with blood chemistry values. Liver MRI with and without contrast could be of assistance in further characterizing these masses. 3. Splenomegaly.4. Multiple borderline enlarged lymph nodes above and below the diaphragm. 5. Homogeneously enlarged thyroid gland, nonspecific. Consider further dedicated evaluation with nonemergent thyroid ultrasound. 6. Mild bronchial wall thickening with bibasilar atelectasis. This could reflect a nonspecific bronchitis.   03/22/2022 Imaging   MRI liver w wo contrast 1. Bilobar hepatic lesions demonstrate irregular contour with minimally increased T2 signal, hypointense intrinsic T1 signal, postcontrast enhancement that is slowly increases and is most prominent in intensities 5 minutes postcontrast administration as well as a peripheral rim of reduced diffusivity.  2. Enlarged upper abdominal lymph nodes, likely reflect metastatic nodal disease involvement. 3. Enhancing lesions in the right tenth rib and L2 vertebral body,concerning for osseous metastatic disease. 4. Prominent subcentimeter pericardiophrenic and internal mammary lymph nodes also suspicious for nodal metastatic disease.    03/31/2022 Cancer Staging   Staging form: Hodgkin and Non-Hodgkin Lymphoma, AJCC 8th Edition - Clinical stage from 03/31/2022:  Stage IV (Diffuse  large B-cell lymphoma) - Signed by [name], MD on 04/19/2022 Stage prefix: Initial diagnosis International Prognostic Index (IPI) score: Score 4 International Prognostic Index risk group: High risk DLBCL cell of origin (COO): Activated B-cell   04/03/2022 Initial Diagnosis   Large cell lymphoma ([place])  04/03/2022, ultrasound-guided liver biopsy showed large B-cell lymphoma.  Additional IHC is pending for subclassification as well as ancillary FISH testing for rearrangementds   04/16/2022 Imaging   PET initial staging showed 1. Multiple bulky intensely hypermetabolic liver masses throughout both liver lobes, compatible with reported lymphoma. Deauville score 5. 2. Mild splenomegaly with mild splenic hypermetabolism, compatible with lymphoma. No enlarged or hypermetabolic lymph nodes. No hypermetabolic skeletal activity. 3. 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 4. Chronic findings include: Aortic Atherosclerosis.Mild sigmoid diverticulosis.     04/20/2022 Procedure   Medi port placed by IR   04/22/2022 -  Chemotherapy   Patient is on Treatment Plan : NON-HODGKINS LYMPHOMA R-POLA CHP q21d     06/05/2022 Imaging   Restaging PET scan showed -PET-CT findings consistent with a complete metabolic response. No residual hypermetabolism involving the liver (Deauville 1). All measurable liver lesions are much smaller.   Mild residual FDG uptake in the thyroid gland. This could be due to treated thyroiditis. Treated thyroid gland involvement with lymphoma is also possible (Deauville 2).      INTERVAL HISTORY Joe Adams is a 62 y.o. male who has above history reviewed by me today presents for follow up visit for large B-cell lymphoma.   Status post 3 cycles  R- Pola CHP treatments.  Overall he tolerates well.   Pain is better, he has gained weight.  No new complaints.   MEDICAL HISTORY:  Past Medical History:  Diagnosis  Date   Acute MI ([place]) 2005   Diabetes mellitus without complication (HCC)    GERD (gastroesophageal reflux disease)    Headache    Hernia, umbilical    History of kidney stones    Large cell lymphoma ([place]) 04/03/2022   Thyroid disease     SURGICAL HISTORY: Past Surgical History:  Procedure Laterality Date   COLONOSCOPY N/A 11/18/2016   Procedure: COLONOSCOPY IN O.R;  Surgeon: Christene Lye, MD;  Location: [facility] ORS;  Service: General;  Laterality: N/A;   COLONOSCOPY WITH PROPOFOL N/A 11/18/2016   Procedure: COLONOSCOPY WITH PROPOFOL;  Surgeon: Christene Lye, MD;  Location: [facility] ENDOSCOPY;  Service: Endoscopy;  Laterality: N/A;   HEMORRHOID SURGERY N/A 11/18/2016   Procedure: HEMORRHOIDECTOMY;  Surgeon: Christene Lye, MD;  Location: [facility] ORS;  Service: General;  Laterality: N/A;   IR IMAGING GUIDED PORT INSERTION  04/20/2022   TONSILLECTOMY     as a child    SOCIAL HISTORY: Social History   Socioeconomic History   Marital status: Legally Separated    Spouse name: Not on file   Number of children: Not on file   Years of education: Not on file   Highest education level: Not on file  Occupational History   Not on file  Tobacco Use   Smoking status: Never   Smokeless tobacco: Never  Vaping Use   Vaping Use: Never used  Substance and Sexual Activity   Alcohol use: Not Currently    Alcohol/week: 1.0 standard drink of alcohol    Types: 1 Shots of liquor per week    Comment: occ   Drug use: No   Sexual activity: Not on file  Other Topics Concern   Not on file  Social History Narrative   Not on file   Social Determinants of Health   Financial Resource Strain: Low Risk  (04/15/2022)   Overall Financial Resource Strain (CARDIA)    Difficulty of Paying Living Expenses: Not very hard  Recent Concern: Financial Resource Strain - Medium Risk (04/15/2022)   Overall Financial Resource Strain (CARDIA)    Difficulty of Paying Living Expenses: Somewhat  hard  Food Insecurity: No Food Insecurity (04/15/2022)   Hunger Vital Sign    Worried About Running Out of Food in the Last Year: Never true    Ran Out of Food in the Last Year: Never true  Transportation Needs: No Transportation Needs (06/16/2022)   PRAPARE - Hydrologist (Medical): No    Lack of Transportation (Non-Medical): No  Physical Activity: Inactive (04/15/2022)   Exercise Vital Sign    Days of Exercise per Week: 0 days    Minutes of Exercise per Session: 0 min  Stress: Stress Concern Present (04/15/2022)   [place]    Feeling of Stress : To some extent  Social Connections: Moderately Integrated (04/15/2022)   Social Connection and Isolation Panel [NHANES]    Frequency of Communication with Friends and Family: Three times a week    Frequency of Social Gatherings with Friends and Family: Twice a week    Attends Religious Services: 1 to 4 times per year    Active Member of [employer] or Organizations: No    Attends Archivist Meetings: Never    Marital Status: Living with partner  Intimate Partner Violence: Not At Risk (04/15/2022)   Humiliation, Afraid, Rape, and Kick questionnaire    Fear of Current or Ex-Partner: No    Emotionally Abused: No    Physically Abused: No    Sexually Abused: No    FAMILY HISTORY: Family History  Problem Relation Age of Onset   Cancer Other    Cancer Paternal Uncle     ALLERGIES:  has No Known Allergies.  MEDICATIONS:  Current Outpatient Medications  Medication Sig Dispense Refill   Acetaminophen (TYLENOL EXTRA STRENGTH PO) Take 1 tablet by mouth every 6 (six) hours as needed (constipation).     acyclovir (ZOVIRAX) 400 MG tablet Take 1 tablet (400 mg total) by mouth daily. 30 tablet 3   allopurinol (ZYLOPRIM) 100 MG tablet Take 1 tablet (100 mg total) by mouth daily. 30 tablet 1   Docusate Calcium (STOOL SOFTENER PO) Take by mouth.      Glucose Blood (TRUE METRIX BLOOD GLUCOSE TEST VI) as directed.     LANTUS SOLOSTAR 100 UNIT/ML Solostar Pen Inject into the skin. Pt is on a sliding scale; 20 units if blood glucose is at 200; takes 25 units if blood glucose on is 225     levothyroxine (SYNTHROID) 150 MCG tablet Take 150 mcg by mouth daily.     lidocaine-prilocaine (EMLA) cream Apply to affected area once 30 g 3   loratadine (CLARITIN) 10 MG tablet Take 10 mg by mouth daily.     metFORMIN (GLUCOPHAGE) 1000 MG tablet 2 (two) times daily.     ondansetron (ZOFRAN) 8 MG tablet Take 1 tablet (8 mg total) by mouth every 8 (eight) hours as needed for nausea or vomiting. Start on the third day after cyclophosphamide chemotherapy. 30 tablet 1   predniSONE (DELTASONE) 20 MG tablet Take 5 tablets (100 mg total) by  mouth daily. Take with food on days 2-5 of chemotherapy. 20 tablet 5   TRUEPLUS 5-BEVEL PEN NEEDLES 31G X 6 MM MISC      HYDROcodone-acetaminophen (NORCO/VICODIN) 5-325 MG tablet Take 1 tablet by mouth every 6 (six) hours as needed. (Patient not taking: Reported on 06/16/2022)     lactulose (CHRONULAC) 10 GM/15ML solution Take 15 mLs (10 g total) by mouth daily as needed for mild constipation. (Patient not taking: Reported on 06/16/2022) 236 mL 0   oxyCODONE-acetaminophen (PERCOCET) 5-325 MG tablet Take 1 tablet by mouth every 6 (six) hours as needed for severe pain. (Patient not taking: Reported on 06/16/2022) 60 tablet 0   prochlorperazine (COMPAZINE) 10 MG tablet Take 1 tablet (10 mg total) by mouth every 6 (six) hours as needed for nausea or vomiting. (Patient not taking: Reported on 06/16/2022) 30 tablet 6   No current facility-administered medications for this visit.   Facility-Administered Medications Ordered in Other Visits  Medication Dose Route Frequency Provider Last Rate Last Admin   heparin lock flush 100 unit/mL  500 Units Intracatheter Once PRN [name], MD       riTUXimab-abbs (TRUXIMA) 700 mg in sodium chloride  0.9 % 250 mL (2.1875 mg/mL) infusion  375 mg/m2 (Treatment Plan Recorded) Intravenous Once [name], MD        Review of Systems  Constitutional:  Positive for fatigue. Negative for appetite change, chills, fever and unexpected weight change.  HENT:   Negative for voice change.        Hair loss  Eyes:  Negative for eye problems and icterus.  Respiratory:  Negative for chest tightness, cough and shortness of breath.   Cardiovascular:  Negative for chest pain and leg swelling.  Gastrointestinal:  Negative for abdominal distention and abdominal pain.  Endocrine: Negative for hot flashes.  Genitourinary:  Negative for difficulty urinating, dysuria and frequency.   Musculoskeletal:  Negative for arthralgias.  Skin:  Negative for itching and rash.  Neurological:  Negative for light-headedness and numbness.  Hematological:  Negative for adenopathy. Does not bruise/bleed easily.  Psychiatric/Behavioral:  Negative for confusion.    PHYSICAL EXAMINATION: ECOG PERFORMANCE STATUS: 1 - Symptomatic but completely ambulatory Vitals:   06/29/22 0841  BP: (!) 129/90  Pulse: 99  Temp: 98 F (36.7 C)  SpO2: 97%   Filed Weights   06/29/22 0841  Weight: 184 lb 8 oz (83.7 kg)    Physical Exam Constitutional:      General: He is not in acute distress. HENT:     Head: Normocephalic and atraumatic.  Eyes:     General: No scleral icterus. Cardiovascular:     Rate and Rhythm: Normal rate.  Pulmonary:     Effort: Pulmonary effort is normal. No respiratory distress.     Breath sounds: No wheezing.  Abdominal:     General: There is no distension.  Musculoskeletal:        General: No deformity. Normal range of motion.     Cervical back: Normal range of motion and neck supple.  Skin:    General: Skin is warm.     Findings: No erythema or rash.  Neurological:     Mental Status: He is alert and oriented to person, place, and time. Mental status is at baseline.     Cranial Nerves: No cranial  nerve deficit.     Coordination: Coordination normal.  Psychiatric:        Mood and Affect: Mood normal.     LABORATORY  DATA:  I have reviewed the data as listed    Latest Ref Rng & Units 06/29/2022    8:21 AM 06/16/2022    9:04 AM 06/08/2022    8:28 AM  CBC  WBC 4.0 - 10.5 K/uL 8.2  10.4  8.7   Hemoglobin 13.0 - 17.0 g/dL 12.6  11.8  12.6   Hematocrit 39.0 - 52.0 % 35.1  33.7  36.0   Platelets 150 - 400 K/uL 178  161  229       Latest Ref Rng & Units 06/29/2022    8:21 AM 06/16/2022    9:04 AM 06/08/2022    8:28 AM  CMP  Glucose 70 - 99 mg/dL 166  162  291   BUN 8 - 23 mg/dL '27  24  31   '$ Creatinine 0.61 - 1.24 mg/dL 1.01  0.77  1.14   Sodium 135 - 145 mmol/L 132  135  133   Potassium 3.5 - 5.1 mmol/L 3.9  3.9  4.5   Chloride 98 - 111 mmol/L 103  105  102   CO2 22 - 32 mmol/L '22  24  21   '$ Calcium 8.9 - 10.3 mg/dL 8.6  9.1  9.2   Total Protein 6.5 - 8.1 g/dL 7.0  6.4  6.8   Total Bilirubin 0.3 - 1.2 mg/dL 0.5  0.4  0.5   Alkaline Phos 38 - 126 U/L 101  110  148   AST 15 - 41 U/L '26  24  22   '$ ALT 0 - 44 U/L '19  26  29       '$ RADIOGRAPHIC STUDIES: I have personally reviewed the radiological images as listed and agreed with the findings in the report. US BIOPSY (LIVER)  Result Date: 03/31/2022 INDICATION: 62 year old male with multifocal liver masses of indeterminate etiology. EXAM: ULTRASOUND GUIDED LIVER LESION BIOPSY COMPARISON:  None Available. MEDICATIONS: None ANESTHESIA/SEDATION: Fentanyl 50 mcg IV; Versed 1 mg IV Total Moderate Sedation time:  10 minutes. The patient's level of consciousness and vital signs were monitored continuously by radiology nursing throughout the procedure under my direct supervision. COMPLICATIONS: None immediate. PROCEDURE: Informed written consent was obtained from the patient after a discussion of the risks, benefits and alternatives to treatment. The patient understands and consents the procedure. A timeout was performed prior to the  initiation of the procedure. Ultrasound scanning was performed of the right upper abdominal quadrant demonstrates multifocal heterogeneously solid masses throughout the liver. A subcapsular right lobe mass was selected for biopsy and the procedure was planned. The right upper abdominal quadrant was prepped and draped in the usual sterile fashion. The overlying soft tissues were anesthetized with 1% lidocaine with epinephrine. A 17 gauge, 6.8 cm co-axial needle was advanced into a peripheral aspect of the lesion. This was followed by 4 core biopsies with an 18 gauge core device under direct ultrasound guidance. The coaxial needle tract was embolized with a small amount of Gel-Foam slurry and superficial hemostasis was obtained with manual compression. Post procedural scanning was negative for definitive area of hemorrhage or additional complication. A dressing was placed. The patient tolerated the procedure well without immediate post procedural complication. IMPRESSION: Technically successful ultrasound guided core needle biopsy of right lobe liver mass. Ruthann Cancer, MD [facility] Radiology Electronically Signed   By: Ruthann Cancer M.D.   On: 03/31/2022 10:49   MR LIVER W WO CONTRAST  Result Date: 03/22/2022 CLINICAL DATA:  Further evaluation of hepatic  lesions seen on prior CT. EXAM: MRI ABDOMEN WITHOUT AND WITH CONTRAST TECHNIQUE: Multiplanar multisequence MR imaging of the abdomen was performed both before and after the administration of intravenous contrast. CONTRAST:  7cc of Vueway COMPARISON:  Multiple priors including most recent CT chest abdomen pelvis dated March 22, 2022. FINDINGS: Lower chest: No acute abnormality. Hepatobiliary: Bilobar hepatic lesions demonstrate irregular contour with minimally increased T2 signal and hypointense intrinsic T1 signal demonstrating postcontrast enhancement which slowly increases over progressive postcontrast pulse  sequences most dominant on the 5 minute delayed and with a peripheral rim of reduced diffusivity. For reference: -lesion in the central right lobe of the liver measures 9.2 x 7.2 cm on image 49/13 -segment IVa/VIII hepatic lesion measures 7.9 x 5.4 cm on image 31/13 -segment II hepatic lesion measures 3.4 x 2.7 cm on image 21/13. No evidence of portal venous involvement. Gallbladder is unremarkable.  No biliary ductal dilation. Pancreas: No pancreatic ductal dilation or evidence of acute inflammation. No suspicious pancreatic mass identified. Spleen:  No splenomegaly or focal splenic lesion. Adrenals/Urinary Tract: Bilateral adrenal glands are within normal limits. No hydronephrosis. No solid enhancing renal mass. Stomach/Bowel: Visualized portions within the abdomen are unremarkable. Vascular/Lymphatic: Normal caliber abdominal aorta. The portal, splenic and superior mesenteric veins are patent. Enlarged retroperitoneal, portacaval, periportal, and gastrohepatic ligament lymph nodes. For reference: -portacaval lymph node measures 13 mm in short axis on image 49/16. -periportal lymph node measures 10 mm in short axis on image 44/16. Prominent subcentimeter pericardiophrenic and internal mammary lymph nodes. For reference: -A lymph node along the inferior aspect of the internal mammary artery on the right below the xiphoid process measures 8 mm in short axis on image 18/22 -A pericardiophrenic lymph node measures 7 mm in short axis on image 88/22. Other:  No significant abdominopelvic free fluid. Musculoskeletal: Enhancing lesion in the right tenth rib on image 38/22. Enhancing lesion in the L2 vertebral body on image 56/22. IMPRESSION: 1. Bilobar hepatic lesions demonstrate irregular contour with minimally increased T2 signal, hypointense intrinsic T1 signal, postcontrast enhancement that is slowly increases and is most prominent in intensities 5 minutes postcontrast administration as well as a peripheral rim of  reduced diffusivity. 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 With alternate differential consideration of atypical hepatocellular carcinoma. Recommend oncology referral with multidisciplinary tumor board consultation and definitive diagnosis with direct tissue sampling. 2. Enlarged upper abdominal lymph nodes, likely reflect metastatic nodal disease involvement. 3. Enhancing lesions in the right tenth rib and L2 vertebral body, concerning for osseous metastatic disease. 4. Prominent subcentimeter pericardiophrenic and internal mammary lymph nodes also suspicious for nodal metastatic disease. Electronically Signed   By: Dahlia Bailiff M.D.   On: 03/22/2022 19:52   CT Angio Chest PE W/Cm &/Or Wo Cm  Result Date: 03/22/2022 CLINICAL DATA:  Abdominal pain, acute, nonlocalized; Pulmonary embolism (PE) suspected, high prob EXAM: CT ANGIOGRAPHY CHEST CT ABDOMEN AND PELVIS WITH CONTRAST TECHNIQUE: Multidetector CT imaging of the chest was performed using the standard protocol during bolus administration of intravenous contrast. Multiplanar CT image reconstructions and MIPs were obtained to evaluate the vascular anatomy. Multidetector CT imaging of the abdomen and pelvis was performed using the standard protocol during bolus administration of intravenous contrast. body onc RADIATION DOSE REDUCTION: This exam was performed according to the departmental dose-optimization program which includes automated exposure control, adjustment of the mA and/or kV according to patient size and/or use of iterative reconstruction technique. CONTRAST:  61m OMNIPAQUE IOHEXOL 350 MG/ML SOLN COMPARISON:  Oct 31, 2014 FINDINGS: CTA CHEST FINDINGS Cardiovascular: Satisfactory opacification of the pulmonary arteries to the segmental level. No evidence of pulmonary embolism. Mildly enlarged heart size. No pericardial effusion. Mediastinum/Nodes: Homogeneously enlarged thyroid gland. There are multiple  borderline enlarged mediastinal lymph nodes. RIGHT paratracheal lymph node measures 11 mm in the short axis (series 507, image 55). No axillary adenopathy or hilar adenopathy. Lungs/Pleura: No pleural effusion or pneumothorax. Mild bronchial wall thickening with scattered areas of endobronchial debris. Bibasilar atelectasis. Musculoskeletal: No aggressive osseous abnormality. Review of the MIP images confirms the above findings. CT ABDOMEN and PELVIS FINDINGS Hepatobiliary: There are multiple heterogeneously enhancing masses throughout the liver. Representative mass within hepatic segment 5/6 spans approximately 8.6 cm (series 11, image 33). Representative heterogeneously enhancing mass spanning hepatic segments 6/7 with associated capsular retraction spans at least 11.8 cm (series 14, image 70). Gallbladder is unremarkable. Portal vein is patent. Pancreas: Unremarkable. No pancreatic ductal dilatation or surrounding inflammatory changes. Spleen: Splenomegaly. Adrenals/Urinary Tract: Adrenal glands are unremarkable. Kidneys are normal, without renal calculi, focal lesion, or hydronephrosis. Bladder is unremarkable. Stomach/Bowel: Stomach is within normal limits. Appendix appears normal. No evidence of bowel wall thickening, distention, or inflammatory changes. Vascular/Lymphatic: Abdominal aorta is normal in course and caliber. Mildly prominent porta hepatic lymph node measures 15 mm in the short axis (series 11, image 28). Prominent LEFT periaortic lymph node measures 9 mm in the short axis (series 11, image 38). Reproductive: Coarse calcifications of the prostate. Other: No free air or free fluid. Musculoskeletal: No aggressive osseous lesions. Limbus vertebra of L5. Review of the MIP images confirms the above findings. IMPRESSION: 1. No acute pulmonary embolism. 2. There are multiple heterogeneously enhancing masses throughout the liver, several which demonstrate capsular retraction. No definitive extrahepatic  primary is identified. Findings could reflect multifocal hepatocellular carcinoma/cholangiocarcinoma, lymphoma or abscess. Recommend correlation with blood chemistry values. Liver MRI with and without contrast could be of assistance in further characterizing these masses. 3. Splenomegaly. 4. Multiple borderline enlarged lymph nodes above and below the diaphragm. 5. Homogeneously enlarged thyroid gland, nonspecific. Consider further dedicated evaluation with nonemergent thyroid ultrasound. 6. Mild bronchial wall thickening with bibasilar atelectasis. This could reflect a nonspecific bronchitis. These results were called by telephone at the time of interpretation on 03/22/2022 at 4:39 pm to provider Dr. Archie Balboa, who verbally acknowledged these results. Electronically Signed   By: Valentino Saxon M.D.   On: 03/22/2022 16:47   CT Abdomen Pelvis W Contrast  Result Date: 03/22/2022 CLINICAL DATA:  Abdominal pain, acute, nonlocalized; Pulmonary embolism (PE) suspected, high prob EXAM: CT ANGIOGRAPHY CHEST CT ABDOMEN AND PELVIS WITH CONTRAST TECHNIQUE: Multidetector CT imaging of the chest was performed using the standard protocol during bolus administration of intravenous contrast. Multiplanar CT image reconstructions and MIPs were obtained to evaluate the vascular anatomy. Multidetector CT imaging of the abdomen and pelvis was performed using the standard protocol during bolus administration of intravenous contrast. body onc RADIATION DOSE REDUCTION: This exam was performed according to the departmental dose-optimization program which includes automated exposure control, adjustment of the mA and/or kV according to patient size and/or use of iterative reconstruction technique. CONTRAST:  53m OMNIPAQUE IOHEXOL 350 MG/ML SOLN COMPARISON:  Oct 31, 2014 FINDINGS: CTA CHEST FINDINGS Cardiovascular: Satisfactory opacification of the pulmonary arteries to the segmental level. No evidence of pulmonary embolism. Mildly  enlarged heart size. No pericardial effusion. Mediastinum/Nodes: Homogeneously enlarged thyroid gland. There are multiple borderline enlarged mediastinal lymph nodes. RIGHT paratracheal lymph node measures 11 mm in the  short axis (series 507, image 55). No axillary adenopathy or hilar adenopathy. Lungs/Pleura: No pleural effusion or pneumothorax. Mild bronchial wall thickening with scattered areas of endobronchial debris. Bibasilar atelectasis. Musculoskeletal: No aggressive osseous abnormality. Review of the MIP images confirms the above findings. CT ABDOMEN and PELVIS FINDINGS Hepatobiliary: There are multiple heterogeneously enhancing masses throughout the liver. Representative mass within hepatic segment 5/6 spans approximately 8.6 cm (series 11, image 33). Representative heterogeneously enhancing mass spanning hepatic segments 6/7 with associated capsular retraction spans at least 11.8 cm (series 14, image 70). Gallbladder is unremarkable. Portal vein is patent. Pancreas: Unremarkable. No pancreatic ductal dilatation or surrounding inflammatory changes. Spleen: Splenomegaly. Adrenals/Urinary Tract: Adrenal glands are unremarkable. Kidneys are normal, without renal calculi, focal lesion, or hydronephrosis. Bladder is unremarkable. Stomach/Bowel: Stomach is within normal limits. Appendix appears normal. No evidence of bowel wall thickening, distention, or inflammatory changes. Vascular/Lymphatic: Abdominal aorta is normal in course and caliber. Mildly prominent porta hepatic lymph node measures 15 mm in the short axis (series 11, image 28). Prominent LEFT periaortic lymph node measures 9 mm in the short axis (series 11, image 38). Reproductive: Coarse calcifications of the prostate. Other: No free air or free fluid. Musculoskeletal: No aggressive osseous lesions. Limbus vertebra of L5. Review of the MIP images confirms the above findings. IMPRESSION: 1. No acute pulmonary embolism. 2. There are multiple  heterogeneously enhancing masses throughout the liver, several which demonstrate capsular retraction. No definitive extrahepatic primary is identified. Findings could reflect multifocal hepatocellular carcinoma/cholangiocarcinoma, lymphoma or abscess. Recommend correlation with blood chemistry values. Liver MRI with and without contrast could be of assistance in further characterizing these masses. 3. Splenomegaly. 4. Multiple borderline enlarged lymph nodes above and below the diaphragm. 5. Homogeneously enlarged thyroid gland, nonspecific. Consider further dedicated evaluation with nonemergent thyroid ultrasound. 6. Mild bronchial wall thickening with bibasilar atelectasis. This could reflect a nonspecific bronchitis. These results were called by telephone at the time of interpretation on 03/22/2022 at 4:39 pm to provider Dr. Archie Balboa, who verbally acknowledged these results. Electronically Signed   By: Valentino Saxon M.D.   On: 03/22/2022 16:47   CT Head Wo Contrast  Result Date: 03/22/2022 CLINICAL DATA:  Dizziness, persistent/recurrent, cardiac or vascular cause suspected EXAM: CT HEAD WITHOUT CONTRAST TECHNIQUE: Contiguous axial images were obtained from the base of the skull through the vertex without intravenous contrast. RADIATION DOSE REDUCTION: This exam was performed according to the departmental dose-optimization program which includes automated exposure control, adjustment of the mA and/or kV according to patient size and/or use of iterative reconstruction technique. COMPARISON:  Nov 17, 2004 FINDINGS: Brain: No evidence of acute infarction, hemorrhage, hydrocephalus, extra-axial collection or mass lesion/mass effect. Periventricular white matter hypodensities consistent with sequela of chronic microvascular ischemic disease. Vascular: No hyperdense vessel or unexpected calcification. Skull: No acute fracture. 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 Sinuses/Orbits: No  acute finding. Other: None. IMPRESSION: No acute intracranial abnormality. Electronically Signed   By: Valentino Saxon M.D.   On: 03/22/2022 16:14   DG Chest 1 View  Result Date: 03/22/2022 CLINICAL DATA:  Dizziness and right-sided pain at night. Intermittent fevers and unexpected weight loss. EXAM: CHEST  1 VIEW COMPARISON:  Chest radiograph 12/29/2010, 12/13/2007 FINDINGS: Borderline enlargement of the cardiac silhouette, which could exaggerated secondary to portable technique. Subtle interstitial thickening in the lower lungs. No pleural effusion or pneumothorax. Visualized osseous structures are unremarkable. IMPRESSION: Subtle interstitial thickening in the lower lungs may represent subsegmental atelectasis or possibly  mild pulmonary edema. No lobar consolidation or pleural effusion. Electronically Signed   By: Ileana Roup M.D.   On: 03/22/2022 13:51

## 2022-06-30 ENCOUNTER — Other Ambulatory Visit: Payer: Self-pay

## 2022-06-30 ENCOUNTER — Inpatient Hospital Stay: Payer: 59

## 2022-06-30 VITALS — BP 130/84 | HR 89 | Temp 98.0°F | Resp 16

## 2022-06-30 DIAGNOSIS — C8338 Diffuse large B-cell lymphoma, lymph nodes of multiple sites: Secondary | ICD-10-CM

## 2022-06-30 DIAGNOSIS — Z5111 Encounter for antineoplastic chemotherapy: Secondary | ICD-10-CM | POA: Diagnosis not present

## 2022-06-30 MED ORDER — SODIUM CHLORIDE 0.9 % IV SOLN
Freq: Once | INTRAVENOUS | Status: AC
  Filled 2022-06-30: qty 250

## 2022-06-30 MED ORDER — PALONOSETRON HCL INJECTION 0.25 MG/5ML
0.2500 mg | Freq: Once | INTRAVENOUS | Status: AC
  Administered 2022-06-30: 0.25 mg via INTRAVENOUS
  Filled 2022-06-30: qty 5

## 2022-06-30 MED ORDER — HEPARIN SOD (PORK) LOCK FLUSH 100 UNIT/ML IV SOLN
500.0000 [IU] | Freq: Once | INTRAVENOUS | Status: AC | PRN
  Administered 2022-06-30: 500 [IU]
  Filled 2022-06-30: qty 5

## 2022-06-30 MED ORDER — DOXORUBICIN HCL CHEMO IV INJECTION 2 MG/ML
50.0000 mg/m2 | Freq: Once | INTRAVENOUS | Status: AC
  Administered 2022-06-30: 94 mg via INTRAVENOUS
  Filled 2022-06-30: qty 47

## 2022-06-30 MED ORDER — SODIUM CHLORIDE 0.9 % IV SOLN
15.0000 mg | Freq: Once | INTRAVENOUS | Status: AC
  Administered 2022-06-30: 15 mg via INTRAVENOUS
  Filled 2022-06-30: qty 1.5

## 2022-06-30 MED ORDER — ACETAMINOPHEN 325 MG PO TABS
650.0000 mg | ORAL_TABLET | Freq: Once | ORAL | Status: AC
  Administered 2022-06-30: 650 mg via ORAL
  Filled 2022-06-30: qty 2

## 2022-06-30 MED ORDER — SODIUM CHLORIDE 0.9 % IV SOLN
1400.0000 mg | Freq: Once | INTRAVENOUS | Status: AC
  Administered 2022-06-30: 1400 mg via INTRAVENOUS
  Filled 2022-06-30: qty 70

## 2022-06-30 MED ORDER — DIPHENHYDRAMINE HCL 25 MG PO CAPS
50.0000 mg | ORAL_CAPSULE | Freq: Once | ORAL | Status: AC
  Administered 2022-06-30: 50 mg via ORAL
  Filled 2022-06-30: qty 2

## 2022-06-30 MED ORDER — SODIUM CHLORIDE 0.9 % IV SOLN
150.0000 mg | Freq: Once | INTRAVENOUS | Status: AC
  Administered 2022-06-30: 150 mg via INTRAVENOUS
  Filled 2022-06-30: qty 150

## 2022-06-30 MED ORDER — SODIUM CHLORIDE 0.9 % IV SOLN
1.8000 mg/kg | Freq: Once | INTRAVENOUS | Status: AC
  Administered 2022-06-30: 140 mg via INTRAVENOUS
  Filled 2022-06-30: qty 7

## 2022-06-30 MED ORDER — SODIUM CHLORIDE 0.9% FLUSH
10.0000 mL | INTRAVENOUS | Status: DC | PRN
  Administered 2022-06-30: 10 mL
  Filled 2022-06-30: qty 10

## 2022-06-30 NOTE — Patient Instructions (Signed)
[facility] AT [place]  Discharge Instructions: Thank you for choosing [facility] to provide your oncology and hematology care.  If you have a lab appointment with the [facility], please go directly to the [facility] and check in at the registration area.  Wear comfortable clothing and clothing appropriate for easy access to any Portacath or PICC line.   We strive to give you quality time with your provider. You may need to reschedule your appointment if you arrive late (15 or more minutes).  Arriving late affects you and other patients whose appointments are after yours.  Also, if you miss three or more appointments without notifying the office, you may be dismissed from the clinic at the provider's discretion.      For prescription refill requests, have your pharmacy contact our office and allow 72 hours for refills to be completed.    Today you received the following chemotherapy and/or immunotherapy agents polivy, adriamycin, cytoxan   To help prevent nausea and vomiting after your treatment, we encourage you to take your nausea medication as directed.  BELOW ARE SYMPTOMS THAT SHOULD BE REPORTED IMMEDIATELY: *FEVER GREATER THAN 100.4 F (38 C) OR HIGHER *CHILLS OR SWEATING *NAUSEA AND VOMITING THAT IS NOT CONTROLLED WITH YOUR NAUSEA MEDICATION *UNUSUAL SHORTNESS OF BREATH *UNUSUAL BRUISING OR BLEEDING *URINARY PROBLEMS (pain or burning when urinating, or frequent urination) *BOWEL PROBLEMS (unusual diarrhea, constipation, pain near the anus) TENDERNESS IN MOUTH AND THROAT WITH OR WITHOUT PRESENCE OF ULCERS (sore throat, sores in mouth, or a toothache) UNUSUAL RASH, SWELLING OR PAIN  UNUSUAL VAGINAL DISCHARGE OR ITCHING   Items with * indicate a potential emergency and should be followed up as soon as possible or go to the Emergency Department if any problems should occur.  Please show the CHEMOTHERAPY ALERT CARD or IMMUNOTHERAPY ALERT CARD  at check-in to the Emergency Department and triage nurse.  Should you have questions after your visit or need to cancel or reschedule your appointment, please contact [facility] [place] [facility] [place]  [phone] and follow the prompts.  Office hours are 8:00 a.m. to 4:30 p.m. Monday - Friday. Please note that voicemails left after 4:00 p.m. may not be returned until the following business day.  We are closed weekends and major holidays. You have access to a nurse at all times for urgent questions. Please call the main number to the clinic [phone] and follow the prompts.  For any non-urgent questions, you may also contact your provider using MyChart. We now offer e-Visits for anyone 95 and older to request care online for non-urgent symptoms. For details visit [email or web address].   Also download the MyChart app! Go to the app store, search "MyChart", open the app, select [facility], and log in with your MyChart username and password.

## 2022-07-01 MED ORDER — SODIUM CHLORIDE (PF) 0.9 % IJ SOLN
Freq: Once | INTRAMUSCULAR | Status: AC
  Filled 2022-07-01: qty 0.48

## 2022-07-01 NOTE — Progress Notes (Signed)
Patient for DG Methotrexate Inj on Thurs 07/02/2022, I called and spoke with the patient on the phone and gave pre-procedure instructions. Pt was made aware to be here at [facility] at the new entrance, NPO after MN prior to procedure as well as driver post procedure/recovery/discharge. Pt stated understanding. Called 07/01/2022  Time has changed for arrival to 8am for these injections per Jonelle Sidle DeWitt so that the medicine can be ordered as soon as possible.

## 2022-07-02 ENCOUNTER — Ambulatory Visit
Admission: RE | Admit: 2022-07-02 | Discharge: 2022-07-02 | Disposition: A | Discharge status: Home / Self Care | Payer: 59 | Source: Ambulatory Visit | Attending: Oncology | Admitting: Oncology

## 2022-07-02 ENCOUNTER — Inpatient Hospital Stay: Payer: 59

## 2022-07-02 VITALS — BP 117/84 | HR 88 | Temp 97.5°F | Resp 20 | Ht 66.0 in | Wt 182.0 lb

## 2022-07-02 VITALS — BP 122/78 | HR 76 | Temp 97.5°F

## 2022-07-02 DIAGNOSIS — C8338 Diffuse large B-cell lymphoma, lymph nodes of multiple sites: Secondary | ICD-10-CM | POA: Insufficient documentation

## 2022-07-02 DIAGNOSIS — Z5111 Encounter for antineoplastic chemotherapy: Secondary | ICD-10-CM | POA: Diagnosis not present

## 2022-07-02 LAB — GLUCOSE, CAPILLARY: Glucose-Capillary: 268 mg/dL — ABNORMAL HIGH (ref 70–99)

## 2022-07-02 MED ORDER — LIDOCAINE HCL (PF) 1 % IJ SOLN
10.0000 mL | Freq: Once | INTRAMUSCULAR | Status: AC
  Administered 2022-07-02: 4 mL via INTRADERMAL

## 2022-07-02 MED ORDER — PEGFILGRASTIM INJECTION 6 MG/0.6ML ~~LOC~~
6.0000 mg | PREFILLED_SYRINGE | Freq: Once | SUBCUTANEOUS | Status: AC
  Administered 2022-07-02: 6 mg via SUBCUTANEOUS

## 2022-07-02 NOTE — Procedures (Signed)
PROCEDURE SUMMARY:  Successful fluoroscopic guided LP with successful methotrexate injection. 5 mL of colorless CSF removed prior to therapeutic injection.  No immediate complications.  Pt tolerated well.   Specimen was not sent for labs.  EBL < 41m  MRockney Ghee1/04/2023 9:55 AM

## 2022-07-02 NOTE — Progress Notes (Signed)
Pt. Tx to [place] via wheelchair escort for injection post LP. LP site clean, dry, intact without any complications at site upon DC from [facility].

## 2022-07-20 ENCOUNTER — Encounter: Payer: Self-pay | Admitting: Oncology

## 2022-07-20 ENCOUNTER — Telehealth: Payer: Self-pay

## 2022-07-20 ENCOUNTER — Inpatient Hospital Stay: Payer: 59

## 2022-07-20 ENCOUNTER — Other Ambulatory Visit: Payer: Self-pay | Admitting: Oncology

## 2022-07-20 ENCOUNTER — Inpatient Hospital Stay (HOSPITAL_BASED_OUTPATIENT_CLINIC_OR_DEPARTMENT_OTHER): Payer: 59 | Admitting: Oncology

## 2022-07-20 VITALS — BP 124/87 | HR 87 | Temp 97.8°F | Resp 16 | Wt 184.8 lb

## 2022-07-20 VITALS — BP 113/87 | HR 78 | Resp 18

## 2022-07-20 DIAGNOSIS — C8338 Diffuse large B-cell lymphoma, lymph nodes of multiple sites: Secondary | ICD-10-CM

## 2022-07-20 DIAGNOSIS — Z5111 Encounter for antineoplastic chemotherapy: Secondary | ICD-10-CM

## 2022-07-20 LAB — CBC WITH DIFFERENTIAL/PLATELET
Abs Immature Granulocytes: 0.19 10*3/uL — ABNORMAL HIGH (ref 0.00–0.07)
Basophils Absolute: 0.1 10*3/uL (ref 0.0–0.1)
Basophils Relative: 1 %
Eosinophils Absolute: 0.2 10*3/uL (ref 0.0–0.5)
Eosinophils Relative: 2 %
HCT: 36.1 % — ABNORMAL LOW (ref 39.0–52.0)
Hemoglobin: 12.5 g/dL — ABNORMAL LOW (ref 13.0–17.0)
Immature Granulocytes: 3 %
Lymphocytes Relative: 12 %
Lymphs Abs: 0.9 10*3/uL (ref 0.7–4.0)
MCH: 30.4 pg (ref 26.0–34.0)
MCHC: 34.6 g/dL (ref 30.0–36.0)
MCV: 87.8 fL (ref 80.0–100.0)
Monocytes Absolute: 1.2 10*3/uL — ABNORMAL HIGH (ref 0.1–1.0)
Monocytes Relative: 16 %
Neutro Abs: 4.8 10*3/uL (ref 1.7–7.7)
Neutrophils Relative %: 66 %
Platelets: 224 10*3/uL (ref 150–400)
RBC: 4.11 MIL/uL — ABNORMAL LOW (ref 4.22–5.81)
RDW: 16.5 % — ABNORMAL HIGH (ref 11.5–15.5)
WBC: 7.3 10*3/uL (ref 4.0–10.5)
nRBC: 0 % (ref 0.0–0.2)

## 2022-07-20 LAB — COMPREHENSIVE METABOLIC PANEL
ALT: 15 U/L (ref 0–44)
AST: 20 U/L (ref 15–41)
Albumin: 4.2 g/dL (ref 3.5–5.0)
Alkaline Phosphatase: 87 U/L (ref 38–126)
Anion gap: 8 (ref 5–15)
BUN: 21 mg/dL (ref 8–23)
CO2: 24 mmol/L (ref 22–32)
Calcium: 9.3 mg/dL (ref 8.9–10.3)
Chloride: 103 mmol/L (ref 98–111)
Creatinine, Ser: 0.8 mg/dL (ref 0.61–1.24)
GFR, Estimated: >60 mL/min (ref >60)
Glucose, Bld: 193 mg/dL — ABNORMAL HIGH (ref 70–99)
Potassium: 4.1 mmol/L (ref 3.5–5.1)
Sodium: 135 mmol/L (ref 135–145)
Total Bilirubin: 0.6 mg/dL (ref 0.3–1.2)
Total Protein: 6.9 g/dL (ref 6.5–8.1)

## 2022-07-20 LAB — LACTATE DEHYDROGENASE: LDH: 146 U/L (ref 98–192)

## 2022-07-20 MED ORDER — SODIUM CHLORIDE 0.9 % IV SOLN
Freq: Once | INTRAVENOUS | Status: AC
  Filled 2022-07-20: qty 250

## 2022-07-20 MED ORDER — ACETAMINOPHEN 325 MG PO TABS
650.0000 mg | ORAL_TABLET | Freq: Once | ORAL | Status: AC
  Administered 2022-07-20: 650 mg via ORAL
  Filled 2022-07-20: qty 2

## 2022-07-20 MED ORDER — FAMOTIDINE IN NACL 20-0.9 MG/50ML-% IV SOLN
20.0000 mg | Freq: Once | INTRAVENOUS | Status: AC
  Administered 2022-07-20: 20 mg via INTRAVENOUS
  Filled 2022-07-20: qty 50

## 2022-07-20 MED ORDER — DIPHENHYDRAMINE HCL 25 MG PO CAPS
50.0000 mg | ORAL_CAPSULE | Freq: Once | ORAL | Status: AC
  Administered 2022-07-20: 50 mg via ORAL
  Filled 2022-07-20: qty 2

## 2022-07-20 MED ORDER — SODIUM CHLORIDE 0.9 % IV SOLN
375.0000 mg/m2 | Freq: Once | INTRAVENOUS | Status: AC
  Administered 2022-07-20: 700 mg via INTRAVENOUS
  Filled 2022-07-20: qty 70

## 2022-07-20 MED ORDER — HEPARIN SOD (PORK) LOCK FLUSH 100 UNIT/ML IV SOLN
500.0000 [IU] | Freq: Once | INTRAVENOUS | Status: AC | PRN
  Administered 2022-07-20: 500 [IU]
  Filled 2022-07-20: qty 5

## 2022-07-20 MED FILL — Fosaprepitant Dimeglumine For IV Infusion 150 MG (Base Eq): INTRAVENOUS | Qty: 5 | Status: AC

## 2022-07-20 NOTE — Assessment & Plan Note (Signed)
Treatment plan as listed above

## 2022-07-20 NOTE — Assessment & Plan Note (Signed)
Improved calcium level Recommend patient to take calcium supplementation.

## 2022-07-20 NOTE — Progress Notes (Signed)
Okay to start the subsequent infusions ('100mg'$ /ml) for Rituximab-abbs per Pharmacy since the patient tolerated it the last time. RN explained to the pt as well.

## 2022-07-20 NOTE — Progress Notes (Signed)
Pt reports legs are hurting but prefers not to take pain medication.  Pt also states he did start calcium supplement but unsure of dosage.

## 2022-07-20 NOTE — Assessment & Plan Note (Addendum)
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  PET scan was reviewed and discussed with patient.- liver, spleen, possible  thyroid,  involvements.  Marrow is negative on PET.  bone marrow biopsy is negative.   IPI score is 4. CNS IPI - 4 Interim PET showed CR.  Labs are reviewed and discussed with patient. Proceed with cycle 5 R-Pola -CHP with GCSF - IT MTX/hydrocortisone.  Recommend patient to take D2-5 prednisone continue allopurinol and acyclovir.

## 2022-07-20 NOTE — Telephone Encounter (Signed)
Request for intrathecal MTX faxed to IR.   Req date 2/22

## 2022-07-20 NOTE — Progress Notes (Signed)
Hematology/Oncology Progress note [phone] [phone]       Patient Care Team: Ranae Plumber, [place] as PCP - General (Family Medicine) Ranae Plumber, [place] (Family Medicine) Christene Lye, MD (General Surgery) Clent Jacks, RN as Oncology Nurse Navigator  ASSESSMENT & PLAN:   Cancer Staging  Diffuse large B cell lymphoma [facility]) Staging form: Hodgkin and Non-Hodgkin Lymphoma, AJCC 8th Edition - Clinical stage from 03/31/2022: Stage IV (Diffuse large B-cell lymphoma) - Signed by [name], MD on 04/19/2022   Diffuse large B cell lymphoma ([place]) BCL6 cmyc IHC positive, FISH negative for BCL2, BCL6, and CMYC rearrangement.  PET scan was reviewed and discussed with patient.- liver, spleen, possible  thyroid,  involvements.  Marrow is negative on PET.  bone marrow biopsy is negative.   IPI score is 4. CNS IPI - 4 Interim PET showed CR.  Labs are reviewed and discussed with patient. Proceed with cycle 5 R-Pola -CHP with GCSF - IT MTX/hydrocortisone.  Recommend patient to take D2-5 prednisone continue allopurinol and acyclovir.   Encounter for antineoplastic chemotherapy Treatment plan as listed above  Hypercalcemia Improved calcium level Recommend patient to take calcium supplementation.   Orders Placed This Encounter  Procedures   DG FLUORO GUIDED LOC OF NEEDLE/CATH TIP FOR SPINAL INJECT LT    Standing Status:   Future    Standing Expiration Date:   07/21/2023    Order Specific Question:   Lab orders requested (DO NOT place separate lab orders, these will be automatically ordered during procedure specimen collection):    Answer:   None    Order Specific Question:   Reason for Exam (SYMPTOM  OR DIAGNOSIS REQUIRED)    Answer:   B Cell Lymphoma    Order Specific Question:   Preferred Imaging Location?    Answer:   [facility]    Order Specific Question:   Radiology Contrast Protocol - do NOT remove file path    Answer:    [email or web address]   Lactate dehydrogenase    Standing Status:   Future    Standing Expiration Date:   08/11/2023   CBC with Differential    Standing Status:   Future    Standing Expiration Date:   08/11/2023   Comprehensive metabolic panel    Standing Status:   Future    Standing Expiration Date:   08/11/2023    Follow up  3 weeks lab MD R- pola CHP IT MTX  D3 udenyca  All questions were answered. The patient knows to call the clinic with any problems, questions or concerns.  [name], MD, PhD [facility] Hematology Oncology 07/20/2022    CHIEF COMPLAINTS/REASON FOR VISIT:  Stage IV DLBCL  HISTORY OF PRESENTING ILLNESS:   Joe Adams is a  62 y.o.  male with PMH listed below was seen in consultation at the request of  [name], MD  for evaluation of liver mass.  03/22/2022, patient presented to emergency room for evaluation of right side lower chest/upper abdomen/flank pain, intermittent low-grade fever and chills, night sweats and unintentional weight loss of 20 to 30 pounds over the past 6 months.  He finished a course of antibiotics a few months ago for unknown infection and symptom has not improved.  Oncology History  Diffuse large B cell lymphoma ([place])  03/22/2022 Imaging   CT chest angiogram, CT abdomen pelvis w contrast  1. No acute pulmonary embolism. 2. There are multiple heterogeneously enhancing masses throughout the liver, several which demonstrate  capsular retraction. No definitive extrahepatic primary is identified. Findings could reflect multifocal hepatocellular carcinoma/cholangiocarcinoma, lymphoma or abscess. Recommend correlation with blood chemistry values. Liver MRI with and without contrast could be of assistance in further characterizing these masses. 3. Splenomegaly.4. Multiple borderline enlarged lymph nodes above and below the diaphragm. 5. Homogeneously enlarged thyroid gland, nonspecific. Consider  further dedicated evaluation with nonemergent thyroid ultrasound. 6. Mild bronchial wall thickening with bibasilar atelectasis. This could reflect a nonspecific bronchitis.   03/22/2022 Imaging   MRI liver w wo contrast 1. Bilobar hepatic lesions demonstrate irregular contour with minimally increased T2 signal, hypointense intrinsic T1 signal, postcontrast enhancement that is slowly increases and is most prominent in intensities 5 minutes postcontrast administration as well as a peripheral rim of reduced diffusivity.  2. Enlarged upper abdominal lymph nodes, likely reflect metastatic nodal disease involvement. 3. Enhancing lesions in the right tenth rib and L2 vertebral body,concerning for osseous metastatic disease. 4. Prominent subcentimeter pericardiophrenic and internal mammary lymph nodes also suspicious for nodal metastatic disease.    03/31/2022 Cancer Staging   Staging form: Hodgkin and Non-Hodgkin Lymphoma, AJCC 8th Edition - Clinical stage from 03/31/2022: Stage IV (Diffuse large B-cell lymphoma) - Signed by [name], MD on 04/19/2022 Stage prefix: Initial diagnosis International Prognostic Index (IPI) score: Score 4 International Prognostic Index risk group: High risk DLBCL cell of origin (COO): Activated B-cell   04/03/2022 Initial Diagnosis   Large cell lymphoma ([place])  04/03/2022, ultrasound-guided liver biopsy showed large B-cell lymphoma.  Additional IHC is pending for subclassification as well as ancillary FISH testing for rearrangementds   04/16/2022 Imaging   PET initial staging showed 1. Multiple bulky intensely hypermetabolic liver masses throughout both liver lobes, compatible with reported lymphoma. Deauville score 5. 2. Mild splenomegaly with mild splenic hypermetabolism, compatible with lymphoma. No enlarged or hypermetabolic lymph nodes. No hypermetabolic skeletal activity. 3. Multinodular goiter with intensely hypermetabolic thyroid nodules, also potentially due  to lymphoma. 4. Chronic findings include: Aortic Atherosclerosis.Mild sigmoid diverticulosis.     04/20/2022 Procedure   Medi port placed by IR   04/22/2022 -  Chemotherapy   Patient is on Treatment Plan : NON-HODGKINS LYMPHOMA R-POLA CHP q21d     06/05/2022 Imaging   Restaging PET scan showed -PET-CT findings consistent with a complete metabolic response. No residual hypermetabolism involving the liver (Deauville 1). All measurable liver lesions are much smaller.   Mild residual FDG uptake in the thyroid gland. This could be due to treated thyroiditis. Treated thyroid gland involvement with lymphoma is also possible (Deauville 2).      INTERVAL HISTORY MINH ROANHORSE is a 62 y.o. male who has above history reviewed by me today presents for follow up visit for large B-cell lymphoma.   Status post 4 cycles  R- Pola CHP treatments.   Overall he tolerates well.   Weight is stable. He has pain in bilateral lower extremities after GCSF injections, he feels it is manageable, not taking any pain medication currently. He takes Claritin.  No new complaints.   MEDICAL HISTORY:  Past Medical History:  Diagnosis Date   Acute MI ([place]) 2005   Diabetes mellitus without complication (HCC)    GERD (gastroesophageal reflux disease)    Headache    Hernia, umbilical    History of kidney stones    Large cell lymphoma ([place]) 04/03/2022   Thyroid disease     SURGICAL HISTORY: Past Surgical History:  Procedure Laterality Date   COLONOSCOPY N/A 11/18/2016   Procedure: COLONOSCOPY IN  O.R;  Surgeon: Christene Lye, MD;  Location: [facility] ORS;  Service: General;  Laterality: N/A;   COLONOSCOPY WITH PROPOFOL N/A 11/18/2016   Procedure: COLONOSCOPY WITH PROPOFOL;  Surgeon: Christene Lye, MD;  Location: [facility] ENDOSCOPY;  Service: Endoscopy;  Laterality: N/A;   HEMORRHOID SURGERY N/A 11/18/2016   Procedure: HEMORRHOIDECTOMY;  Surgeon: Christene Lye, MD;  Location: [facility] ORS;  Service:  General;  Laterality: N/A;   IR IMAGING GUIDED PORT INSERTION  04/20/2022   TONSILLECTOMY     as a child    SOCIAL HISTORY: Social History   Socioeconomic History   Marital status: Legally Separated    Spouse name: Not on file   Number of children: Not on file   Years of education: Not on file   Highest education level: Not on file  Occupational History   Not on file  Tobacco Use   Smoking status: Never   Smokeless tobacco: Never  Vaping Use   Vaping Use: Never used  Substance and Sexual Activity   Alcohol use: Not Currently    Alcohol/week: 1.0 standard drink of alcohol    Types: 1 Shots of liquor per week    Comment: occ   Drug use: No   Sexual activity: Not on file  Other Topics Concern   Not on file  Social History Narrative   Not on file   Social Determinants of Health   Financial Resource Strain: Low Risk  (04/15/2022)   Overall Financial Resource Strain (CARDIA)    Difficulty of Paying Living Expenses: Not very hard  Recent Concern: Financial Resource Strain - Medium Risk (04/15/2022)   Overall Financial Resource Strain (CARDIA)    Difficulty of Paying Living Expenses: Somewhat hard  Food Insecurity: No Food Insecurity (04/15/2022)   Hunger Vital Sign    Worried About [place] in the Last Year: Never true    Ran Out of Food in the Last Year: Never true  Transportation Needs: No Transportation Needs (06/16/2022)   PRAPARE - Hydrologist (Medical): No    Lack of Transportation (Non-Medical): No  Physical Activity: Inactive (04/15/2022)   Exercise Vital Sign    Days of Exercise per Week: 0 days    Minutes of Exercise per Session: 0 min  Stress: Stress Concern Present (04/15/2022)   [place]    Feeling of Stress : To some extent  Social Connections: Moderately Integrated (04/15/2022)   Social Connection and Isolation Panel [NHANES]    Frequency of  Communication with Friends and Family: Three times a week    Frequency of Social Gatherings with Friends and Family: Twice a week    Attends Religious Services: 1 to 4 times per year    Active Member of [employer] or Organizations: No    Attends Archivist Meetings: Never    Marital Status: Living with partner  Intimate Partner Violence: Not At Risk (04/15/2022)   Humiliation, Afraid, Rape, and Kick questionnaire    Fear of Current or Ex-Partner: No    Emotionally Abused: No    Physically Abused: No    Sexually Abused: No    FAMILY HISTORY: Family History  Problem Relation Age of Onset   Cancer Other    Cancer Paternal Uncle     ALLERGIES:  has No Known Allergies.  MEDICATIONS:  Current Outpatient Medications  Medication Sig Dispense Refill   acyclovir (ZOVIRAX) 400 MG tablet Take 1 tablet (  400 mg total) by mouth daily. 30 tablet 3   allopurinol (ZYLOPRIM) 100 MG tablet Take 1 tablet (100 mg total) by mouth daily. 30 tablet 1   Docusate Calcium (STOOL SOFTENER PO) Take by mouth.     Glucose Blood (TRUE METRIX BLOOD GLUCOSE TEST VI) as directed.     LANTUS SOLOSTAR 100 UNIT/ML Solostar Pen Inject into the skin. Pt is on a sliding scale; 20 units if blood glucose is at 200; takes 25 units if blood glucose on is 225     levothyroxine (SYNTHROID) 150 MCG tablet Take 150 mcg by mouth daily.     lidocaine-prilocaine (EMLA) cream Apply to affected area once 30 g 3   loratadine (CLARITIN) 10 MG tablet Take 10 mg by mouth daily.     metFORMIN (GLUCOPHAGE) 1000 MG tablet 2 (two) times daily.     ondansetron (ZOFRAN) 8 MG tablet Take 1 tablet (8 mg total) by mouth every 8 (eight) hours as needed for nausea or vomiting. Start on the third day after cyclophosphamide chemotherapy. 30 tablet 1   predniSONE (DELTASONE) 20 MG tablet Take 5 tablets (100 mg total) by mouth daily. Take with food on days 2-5 of chemotherapy. 20 tablet 5   TRUEPLUS 5-BEVEL PEN NEEDLES 31G X 6 MM MISC       Acetaminophen (TYLENOL EXTRA STRENGTH PO) Take 1 tablet by mouth every 6 (six) hours as needed (constipation). (Patient not taking: Reported on 07/20/2022)     HYDROcodone-acetaminophen (NORCO/VICODIN) 5-325 MG tablet Take 1 tablet by mouth every 6 (six) hours as needed. (Patient not taking: Reported on 06/16/2022)     lactulose (CHRONULAC) 10 GM/15ML solution Take 15 mLs (10 g total) by mouth daily as needed for mild constipation. (Patient not taking: Reported on 07/20/2022) 236 mL 0   oxyCODONE-acetaminophen (PERCOCET) 5-325 MG tablet Take 1 tablet by mouth every 6 (six) hours as needed for severe pain. (Patient not taking: Reported on 07/20/2022) 60 tablet 0   prochlorperazine (COMPAZINE) 10 MG tablet Take 1 tablet (10 mg total) by mouth every 6 (six) hours as needed for nausea or vomiting. (Patient not taking: Reported on 06/16/2022) 30 tablet 6   No current facility-administered medications for this visit.   Facility-Administered Medications Ordered in Other Visits  Medication Dose Route Frequency Provider Last Rate Last Admin   heparin lock flush 100 unit/mL  500 Units Intracatheter Once PRN [name], MD        Review of Systems  Constitutional:  Positive for fatigue. Negative for appetite change, chills, fever and unexpected weight change.  HENT:   Negative for voice change.        Hair loss  Eyes:  Negative for eye problems and icterus.  Respiratory:  Negative for chest tightness, cough and shortness of breath.   Cardiovascular:  Negative for chest pain and leg swelling.  Gastrointestinal:  Negative for abdominal distention and abdominal pain.  Endocrine: Negative for hot flashes.  Genitourinary:  Negative for difficulty urinating, dysuria and frequency.   Musculoskeletal:  Negative for arthralgias.  Skin:  Negative for itching and rash.  Neurological:  Negative for light-headedness and numbness.  Hematological:  Negative for adenopathy. Does not bruise/bleed easily.   Psychiatric/Behavioral:  Negative for confusion.    PHYSICAL EXAMINATION: ECOG PERFORMANCE STATUS: 1 - Symptomatic but completely ambulatory Vitals:   07/20/22 0847  BP: 124/87  Pulse: 87  Resp: 16  Temp: 97.8 F (36.6 C)  SpO2: 100%   Filed Weights   07/20/22  0847  Weight: 184 lb 12.8 oz (83.8 kg)    Physical Exam Constitutional:      General: He is not in acute distress. HENT:     Head: Normocephalic and atraumatic.  Eyes:     General: No scleral icterus. Cardiovascular:     Rate and Rhythm: Normal rate.  Pulmonary:     Effort: Pulmonary effort is normal. No respiratory distress.     Breath sounds: No wheezing.  Abdominal:     General: There is no distension.  Musculoskeletal:        General: No deformity. Normal range of motion.     Cervical back: Normal range of motion and neck supple.  Skin:    General: Skin is warm.     Findings: No erythema or rash.  Neurological:     Mental Status: He is alert and oriented to person, place, and time. Mental status is at baseline.     Cranial Nerves: No cranial nerve deficit.     Coordination: Coordination normal.  Psychiatric:        Mood and Affect: Mood normal.     LABORATORY DATA:  I have reviewed the data as listed    Latest Ref Rng & Units 07/20/2022    8:20 AM 06/29/2022    8:21 AM 06/16/2022    9:04 AM  CBC  WBC 4.0 - 10.5 K/uL 7.3  8.2  10.4   Hemoglobin 13.0 - 17.0 g/dL 12.5  12.6  11.8   Hematocrit 39.0 - 52.0 % 36.1  35.1  33.7   Platelets 150 - 400 K/uL 224  178  161       Latest Ref Rng & Units 07/20/2022    8:20 AM 06/29/2022    8:21 AM 06/16/2022    9:04 AM  CMP  Glucose 70 - 99 mg/dL 193  166  162   BUN 8 - 23 mg/dL '21  27  24   '$ Creatinine 0.61 - 1.24 mg/dL 0.80  1.01  0.77   Sodium 135 - 145 mmol/L 135  132  135   Potassium 3.5 - 5.1 mmol/L 4.1  3.9  3.9   Chloride 98 - 111 mmol/L 103  103  105   CO2 22 - 32 mmol/L '24  22  24   '$ Calcium 8.9 - 10.3 mg/dL 9.3  8.6  9.1   Total Protein 6.5 -  8.1 g/dL 6.9  7.0  6.4   Total Bilirubin 0.3 - 1.2 mg/dL 0.6  0.5  0.4   Alkaline Phos 38 - 126 U/L 87  101  110   AST 15 - 41 U/L '20  26  24   '$ ALT 0 - 44 U/L '15  19  26       '$ RADIOGRAPHIC STUDIES: I have personally reviewed the radiological images as listed and agreed with the findings in the report. US BIOPSY (LIVER)  Result Date: 03/31/2022 INDICATION: 62 year old male with multifocal liver masses of indeterminate etiology. EXAM: ULTRASOUND GUIDED LIVER LESION BIOPSY COMPARISON:  None Available. MEDICATIONS: None ANESTHESIA/SEDATION: Fentanyl 50 mcg IV; Versed 1 mg IV Total Moderate Sedation time:  10 minutes. The patient's level of consciousness and vital signs were monitored continuously by radiology nursing throughout the procedure under my direct supervision. COMPLICATIONS: None immediate. PROCEDURE: Informed written consent was obtained from the patient after a discussion of the risks, benefits and alternatives to treatment. The patient understands and consents the procedure. A timeout was performed prior to the initiation of the  procedure. Ultrasound scanning was performed of the right upper abdominal quadrant demonstrates multifocal heterogeneously solid masses throughout the liver. A subcapsular right lobe mass was selected for biopsy and the procedure was planned. The right upper abdominal quadrant was prepped and draped in the usual sterile fashion. The overlying soft tissues were anesthetized with 1% lidocaine with epinephrine. A 17 gauge, 6.8 cm co-axial needle was advanced into a peripheral aspect of the lesion. This was followed by 4 core biopsies with an 18 gauge core device under direct ultrasound guidance. The coaxial needle tract was embolized with a small amount of Gel-Foam slurry and superficial hemostasis was obtained with manual compression. Post procedural scanning was negative for definitive area of hemorrhage or additional complication. A dressing was placed. The patient  tolerated the procedure well without immediate post procedural complication. IMPRESSION: Technically successful ultrasound guided core needle biopsy of right lobe liver mass. Ruthann Cancer, MD [facility] Electronically Signed   By: Ruthann Cancer M.D.   On: 03/31/2022 10:49   MR LIVER W WO CONTRAST  Result Date: 03/22/2022 CLINICAL DATA:  Further evaluation of hepatic lesions seen on prior CT. EXAM: MRI ABDOMEN WITHOUT AND WITH CONTRAST TECHNIQUE: Multiplanar multisequence MR imaging of the abdomen was performed both before and after the administration of intravenous contrast. CONTRAST:  7cc of Vueway COMPARISON:  Multiple priors including most recent CT chest abdomen pelvis dated March 22, 2022. FINDINGS: Lower chest: No acute abnormality. Hepatobiliary: Bilobar hepatic lesions demonstrate irregular contour with minimally increased T2 signal and hypointense intrinsic T1 signal demonstrating postcontrast enhancement which slowly increases over progressive postcontrast pulse sequences most dominant on the 5 minute delayed and with a peripheral rim of reduced diffusivity. For reference: -lesion in the central right lobe of the liver measures 9.2 x 7.2 cm on image 49/13 -segment IVa/VIII hepatic lesion measures 7.9 x 5.4 cm on image 31/13 -segment II hepatic lesion measures 3.4 x 2.7 cm on image 21/13. No evidence of portal venous involvement. Gallbladder is unremarkable.  No biliary ductal dilation. Pancreas: No pancreatic ductal dilation or evidence of acute inflammation. No suspicious pancreatic mass identified. Spleen:  No splenomegaly or focal splenic lesion. Adrenals/Urinary Tract: Bilateral adrenal glands are within normal limits. No hydronephrosis. No solid enhancing renal mass. Stomach/Bowel: Visualized portions within the abdomen are unremarkable. Vascular/Lymphatic: Normal caliber abdominal aorta. The portal, splenic and superior mesenteric veins  are patent. Enlarged retroperitoneal, portacaval, periportal, and gastrohepatic ligament lymph nodes. For reference: -portacaval lymph node measures 13 mm in short axis on image 49/16. -periportal lymph node measures 10 mm in short axis on image 44/16. Prominent subcentimeter pericardiophrenic and internal mammary lymph nodes. For reference: -A lymph node along the inferior aspect of the internal mammary artery on the right below the xiphoid process measures 8 mm in short axis on image 18/22 -A pericardiophrenic lymph node measures 7 mm in short axis on image 88/22. Other:  No significant abdominopelvic free fluid. Musculoskeletal: Enhancing lesion in the right tenth rib on image 38/22. Enhancing lesion in the L2 vertebral body on image 56/22. IMPRESSION: 1. Bilobar hepatic lesions demonstrate irregular contour with minimally increased T2 signal, hypointense intrinsic T1 signal, postcontrast enhancement that is slowly increases and is most prominent in intensities 5 minutes postcontrast administration as well as a peripheral rim of reduced diffusivity. 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 With alternate differential consideration of atypical hepatocellular carcinoma. Recommend oncology referral with multidisciplinary tumor board consultation and definitive diagnosis with  direct tissue sampling. 2. Enlarged upper abdominal lymph nodes, likely reflect metastatic nodal disease involvement. 3. Enhancing lesions in the right tenth rib and L2 vertebral body, concerning for osseous metastatic disease. 4. Prominent subcentimeter pericardiophrenic and internal mammary lymph nodes also suspicious for nodal metastatic disease. Electronically Signed   By: Dahlia Bailiff M.D.   On: 03/22/2022 19:52   CT Angio Chest PE W/Cm &/Or Wo Cm  Result Date: 03/22/2022 CLINICAL DATA:  Abdominal pain, acute, nonlocalized; Pulmonary embolism (PE) suspected, high prob EXAM: CT ANGIOGRAPHY CHEST  CT ABDOMEN AND PELVIS WITH CONTRAST TECHNIQUE: Multidetector CT imaging of the chest was performed using the standard protocol during bolus administration of intravenous contrast. Multiplanar CT image reconstructions and MIPs were obtained to evaluate the vascular anatomy. Multidetector CT imaging of the abdomen and pelvis was performed using the standard protocol during bolus administration of intravenous contrast. body onc RADIATION DOSE REDUCTION: This exam was performed according to the departmental dose-optimization program which includes automated exposure control, adjustment of the mA and/or kV according to patient size and/or use of iterative reconstruction technique. CONTRAST:  25m OMNIPAQUE IOHEXOL 350 MG/ML SOLN COMPARISON:  Oct 31, 2014 FINDINGS: CTA CHEST FINDINGS Cardiovascular: Satisfactory opacification of the pulmonary arteries to the segmental level. No evidence of pulmonary embolism. Mildly enlarged heart size. No pericardial effusion. Mediastinum/Nodes: Homogeneously enlarged thyroid gland. There are multiple borderline enlarged mediastinal lymph nodes. RIGHT paratracheal lymph node measures 11 mm in the short axis (series 507, image 55). No axillary adenopathy or hilar adenopathy. Lungs/Pleura: No pleural effusion or pneumothorax. Mild bronchial wall thickening with scattered areas of endobronchial debris. Bibasilar atelectasis. Musculoskeletal: No aggressive osseous abnormality. Review of the MIP images confirms the above findings. CT ABDOMEN and PELVIS FINDINGS Hepatobiliary: There are multiple heterogeneously enhancing masses throughout the liver. Representative mass within hepatic segment 5/6 spans approximately 8.6 cm (series 11, image 33). Representative heterogeneously enhancing mass spanning hepatic segments 6/7 with associated capsular retraction spans at least 11.8 cm (series 14, image 70). Gallbladder is unremarkable. Portal vein is patent. Pancreas: Unremarkable. No pancreatic  ductal dilatation or surrounding inflammatory changes. Spleen: Splenomegaly. Adrenals/Urinary Tract: Adrenal glands are unremarkable. Kidneys are normal, without renal calculi, focal lesion, or hydronephrosis. Bladder is unremarkable. Stomach/Bowel: Stomach is within normal limits. Appendix appears normal. No evidence of bowel wall thickening, distention, or inflammatory changes. Vascular/Lymphatic: Abdominal aorta is normal in course and caliber. Mildly prominent porta hepatic lymph node measures 15 mm in the short axis (series 11, image 28). Prominent LEFT periaortic lymph node measures 9 mm in the short axis (series 11, image 38). Reproductive: Coarse calcifications of the prostate. Other: No free air or free fluid. Musculoskeletal: No aggressive osseous lesions. Limbus vertebra of L5. Review of the MIP images confirms the above findings. IMPRESSION: 1. No acute pulmonary embolism. 2. There are multiple heterogeneously enhancing masses throughout the liver, several which demonstrate capsular retraction. No definitive extrahepatic primary is identified. Findings could reflect multifocal hepatocellular carcinoma/cholangiocarcinoma, lymphoma or abscess. Recommend correlation with blood chemistry values. Liver MRI with and without contrast could be of assistance in further characterizing these masses. 3. Splenomegaly. 4. Multiple borderline enlarged lymph nodes above and below the diaphragm. 5. Homogeneously enlarged thyroid gland, nonspecific. Consider further dedicated evaluation with nonemergent thyroid ultrasound. 6. Mild bronchial wall thickening with bibasilar atelectasis. This could reflect a nonspecific bronchitis. These results were called by telephone at the time of interpretation on 03/22/2022 at 4:39 pm to provider Dr. GArchie Balboa who verbally acknowledged these results. Electronically Signed  By: Valentino Saxon M.D.   On: 03/22/2022 16:47   CT Abdomen Pelvis W Contrast  Result Date:  03/22/2022 CLINICAL DATA:  Abdominal pain, acute, nonlocalized; Pulmonary embolism (PE) suspected, high prob EXAM: CT ANGIOGRAPHY CHEST CT ABDOMEN AND PELVIS WITH CONTRAST TECHNIQUE: Multidetector CT imaging of the chest was performed using the standard protocol during bolus administration of intravenous contrast. Multiplanar CT image reconstructions and MIPs were obtained to evaluate the vascular anatomy. Multidetector CT imaging of the abdomen and pelvis was performed using the standard protocol during bolus administration of intravenous contrast. body onc RADIATION DOSE REDUCTION: This exam was performed according to the departmental dose-optimization program which includes automated exposure control, adjustment of the mA and/or kV according to patient size and/or use of iterative reconstruction technique. CONTRAST:  46m OMNIPAQUE IOHEXOL 350 MG/ML SOLN COMPARISON:  Oct 31, 2014 FINDINGS: CTA CHEST FINDINGS Cardiovascular: Satisfactory opacification of the pulmonary arteries to the segmental level. No evidence of pulmonary embolism. Mildly enlarged heart size. No pericardial effusion. Mediastinum/Nodes: Homogeneously enlarged thyroid gland. There are multiple borderline enlarged mediastinal lymph nodes. RIGHT paratracheal lymph node measures 11 mm in the short axis (series 507, image 55). No axillary adenopathy or hilar adenopathy. Lungs/Pleura: No pleural effusion or pneumothorax. Mild bronchial wall thickening with scattered areas of endobronchial debris. Bibasilar atelectasis. Musculoskeletal: No aggressive osseous abnormality. Review of the MIP images confirms the above findings. CT ABDOMEN and PELVIS FINDINGS Hepatobiliary: There are multiple heterogeneously enhancing masses throughout the liver. Representative mass within hepatic segment 5/6 spans approximately 8.6 cm (series 11, image 33). Representative heterogeneously enhancing mass spanning hepatic segments 6/7 with associated capsular retraction spans  at least 11.8 cm (series 14, image 70). Gallbladder is unremarkable. Portal vein is patent. Pancreas: Unremarkable. No pancreatic ductal dilatation or surrounding inflammatory changes. Spleen: Splenomegaly. Adrenals/Urinary Tract: Adrenal glands are unremarkable. Kidneys are normal, without renal calculi, focal lesion, or hydronephrosis. Bladder is unremarkable. Stomach/Bowel: Stomach is within normal limits. Appendix appears normal. No evidence of bowel wall thickening, distention, or inflammatory changes. Vascular/Lymphatic: Abdominal aorta is normal in course and caliber. Mildly prominent porta hepatic lymph node measures 15 mm in the short axis (series 11, image 28). Prominent LEFT periaortic lymph node measures 9 mm in the short axis (series 11, image 38). Reproductive: Coarse calcifications of the prostate. Other: No free air or free fluid. Musculoskeletal: No aggressive osseous lesions. Limbus vertebra of L5. Review of the MIP images confirms the above findings. IMPRESSION: 1. No acute pulmonary embolism. 2. There are multiple heterogeneously enhancing masses throughout the liver, several which demonstrate capsular retraction. No definitive extrahepatic primary is identified. Findings could reflect multifocal hepatocellular carcinoma/cholangiocarcinoma, lymphoma or abscess. Recommend correlation with blood chemistry values. Liver MRI with and without contrast could be of assistance in further characterizing these masses. 3. Splenomegaly. 4. Multiple borderline enlarged lymph nodes above and below the diaphragm. 5. Homogeneously enlarged thyroid gland, nonspecific. Consider further dedicated evaluation with nonemergent thyroid ultrasound. 6. Mild bronchial wall thickening with bibasilar atelectasis. This could reflect a nonspecific bronchitis. These results were called by telephone at the time of interpretation on 03/22/2022 at 4:39 pm to provider Dr. GArchie Balboa who verbally acknowledged these results.  Electronically Signed   By: SValentino SaxonM.D.   On: 03/22/2022 16:47   CT Head Wo Contrast  Result Date: 03/22/2022 CLINICAL DATA:  Dizziness, persistent/recurrent, cardiac or vascular cause suspected EXAM: CT HEAD WITHOUT CONTRAST TECHNIQUE: Contiguous axial images were obtained from the base of the skull through the vertex without intravenous  contrast. RADIATION DOSE REDUCTION: This exam was performed according to the departmental dose-optimization program which includes automated exposure control, adjustment of the mA and/or kV according to patient size and/or use of iterative reconstruction technique. COMPARISON:  Nov 17, 2004 FINDINGS: Brain: No evidence of acute infarction, hemorrhage, hydrocephalus, extra-axial collection or mass lesion/mass effect. Periventricular white matter hypodensities consistent with sequela of chronic microvascular ischemic disease. Vascular: No hyperdense vessel or unexpected calcification. Skull: No acute fracture. 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 Sinuses/Orbits: No acute finding. Other: None. IMPRESSION: No acute intracranial abnormality. Electronically Signed   By: Valentino Saxon M.D.   On: 03/22/2022 16:14   DG Chest 1 View  Result Date: 03/22/2022 CLINICAL DATA:  Dizziness and right-sided pain at night. Intermittent fevers and unexpected weight loss. EXAM: CHEST  1 VIEW COMPARISON:  Chest radiograph 12/29/2010, 12/13/2007 FINDINGS: Borderline enlargement of the cardiac silhouette, which could exaggerated secondary to portable technique. Subtle interstitial thickening in the lower lungs. No pleural effusion or pneumothorax. Visualized osseous structures are unremarkable. IMPRESSION: Subtle interstitial thickening in the lower lungs may represent subsegmental atelectasis or possibly mild pulmonary edema. No lobar consolidation or pleural effusion. Electronically Signed   By: Ileana Roup M.D.   On: 03/22/2022 13:51

## 2022-07-21 ENCOUNTER — Inpatient Hospital Stay: Payer: 59

## 2022-07-21 ENCOUNTER — Encounter: Payer: Self-pay | Admitting: Oncology

## 2022-07-21 VITALS — BP 128/75 | HR 79 | Temp 97.9°F | Resp 18

## 2022-07-21 DIAGNOSIS — C8338 Diffuse large B-cell lymphoma, lymph nodes of multiple sites: Secondary | ICD-10-CM

## 2022-07-21 DIAGNOSIS — Z5111 Encounter for antineoplastic chemotherapy: Secondary | ICD-10-CM | POA: Diagnosis not present

## 2022-07-21 MED ORDER — ACETAMINOPHEN 325 MG PO TABS
650.0000 mg | ORAL_TABLET | Freq: Once | ORAL | Status: AC
  Administered 2022-07-21: 650 mg via ORAL
  Filled 2022-07-21: qty 2

## 2022-07-21 MED ORDER — PALONOSETRON HCL INJECTION 0.25 MG/5ML
0.2500 mg | Freq: Once | INTRAVENOUS | Status: AC
  Administered 2022-07-21: 0.25 mg via INTRAVENOUS
  Filled 2022-07-21: qty 5

## 2022-07-21 MED ORDER — SODIUM CHLORIDE 0.9 % IV SOLN
140.0000 mg | Freq: Once | INTRAVENOUS | Status: AC
  Administered 2022-07-21: 140 mg via INTRAVENOUS
  Filled 2022-07-21: qty 7

## 2022-07-21 MED ORDER — DIPHENHYDRAMINE HCL 25 MG PO CAPS
50.0000 mg | ORAL_CAPSULE | Freq: Once | ORAL | Status: AC
  Administered 2022-07-21: 50 mg via ORAL
  Filled 2022-07-21: qty 2

## 2022-07-21 MED ORDER — DOXORUBICIN HCL CHEMO IV INJECTION 2 MG/ML
50.0000 mg/m2 | Freq: Once | INTRAVENOUS | Status: AC
  Administered 2022-07-21: 100 mg via INTRAVENOUS
  Filled 2022-07-21: qty 50

## 2022-07-21 MED ORDER — SODIUM CHLORIDE 0.9 % IV SOLN
Freq: Once | INTRAVENOUS | Status: AC
  Filled 2022-07-21: qty 250

## 2022-07-21 MED ORDER — SODIUM CHLORIDE 0.9 % IV SOLN
15.0000 mg | Freq: Once | INTRAVENOUS | Status: AC
  Administered 2022-07-21: 15 mg via INTRAVENOUS
  Filled 2022-07-21: qty 1.5

## 2022-07-21 MED ORDER — SODIUM CHLORIDE 0.9 % IV SOLN
750.0000 mg/m2 | Freq: Once | INTRAVENOUS | Status: AC
  Administered 2022-07-21: 1500 mg via INTRAVENOUS
  Filled 2022-07-21: qty 75

## 2022-07-21 MED ORDER — SODIUM CHLORIDE 0.9 % IV SOLN
150.0000 mg | Freq: Once | INTRAVENOUS | Status: AC
  Administered 2022-07-21: 150 mg via INTRAVENOUS
  Filled 2022-07-21: qty 150

## 2022-07-21 NOTE — Telephone Encounter (Signed)
Pt ha been scheduled

## 2022-07-21 NOTE — Patient Instructions (Signed)
[facility]  Discharge Instructions: Thank you for choosing [facility] to provide your oncology and hematology care.  If you have a lab appointment with the [facility], please go directly to the [facility] and check in at the registration area.  Wear comfortable clothing and clothing appropriate for easy access to any Portacath or PICC line.   We strive to give you quality time with your provider. You may need to reschedule your appointment if you arrive late (15 or more minutes).  Arriving late affects you and other patients whose appointments are after yours.  Also, if you miss three or more appointments without notifying the office, you may be dismissed from the clinic at the provider's discretion.      For prescription refill requests, have your pharmacy contact our office and allow 72 hours for refills to be completed.    Today you received the following chemotherapy and/or immunotherapy agents polivy, adriamycin, cytoxan   To help prevent nausea and vomiting after your treatment, we encourage you to take your nausea medication as directed.  BELOW ARE SYMPTOMS THAT SHOULD BE REPORTED IMMEDIATELY: *FEVER GREATER THAN 100.4 F (38 C) OR HIGHER *CHILLS OR SWEATING *NAUSEA AND VOMITING THAT IS NOT CONTROLLED WITH YOUR NAUSEA MEDICATION *UNUSUAL SHORTNESS OF BREATH *UNUSUAL BRUISING OR BLEEDING *URINARY PROBLEMS (pain or burning when urinating, or frequent urination) *BOWEL PROBLEMS (unusual diarrhea, constipation, pain near the anus) TENDERNESS IN MOUTH AND THROAT WITH OR WITHOUT PRESENCE OF ULCERS (sore throat, sores in mouth, or a toothache) UNUSUAL RASH, SWELLING OR PAIN  UNUSUAL VAGINAL DISCHARGE OR ITCHING   Items with * indicate a potential emergency and should be followed up as soon as possible or go to the Emergency Department if any problems should occur.  Please show the CHEMOTHERAPY ALERT CARD or IMMUNOTHERAPY ALERT CARD  at check-in to the Emergency Department and triage nurse.  Should you have questions after your visit or need to cancel or reschedule your appointment, please contact [facility]  [phone] and follow the prompts.  Office hours are 8:00 a.m. to 4:30 p.m. Monday - Friday. Please note that voicemails left after 4:00 p.m. may not be returned until the following business day.  We are closed weekends and major holidays. You have access to a nurse at all times for urgent questions. Please call the main number to the clinic [phone] and follow the prompts.  For any non-urgent questions, you may also contact your provider using MyChart. We now offer e-Visits for anyone 14 and older to request care online for non-urgent symptoms. For details visit [email or web address].   Also download the MyChart app! Go to the app store, search "MyChart", open the app, select [facility], and log in with your MyChart username and password.

## 2022-07-22 MED ORDER — SODIUM CHLORIDE (PF) 0.9 % IJ SOLN
Freq: Once | INTRAMUSCULAR | Status: AC
  Filled 2022-07-22: qty 0.48

## 2022-07-22 MED FILL — Hydrocortisone Sodium Succinate PF For Inj 100 MG: INTRAMUSCULAR | Qty: 0.5 | Status: AC

## 2022-07-22 MED FILL — Methotrexate Sodium Inj PF 50 MG/2ML (25 MG/ML): INTRAMUSCULAR | Qty: 0.48 | Status: AC

## 2022-07-22 NOTE — Progress Notes (Signed)
Patient for DG Methotrexate Inj on Thurs 07/23/2022, I called and spoke with the patient on the  phone and gave pre-procedure instructions. Pt was made aware to be here at [facility] at the new entrance. Pt stated understanding. Called 07/22/2022

## 2022-07-23 ENCOUNTER — Ambulatory Visit
Admission: RE | Admit: 2022-07-23 | Discharge: 2022-07-23 | Disposition: A | Discharge status: Home / Self Care | Payer: 59 | Source: Ambulatory Visit | Attending: Oncology | Admitting: Oncology

## 2022-07-23 ENCOUNTER — Inpatient Hospital Stay: Payer: 59 | Attending: Oncology

## 2022-07-23 VITALS — BP 105/64 | HR 89 | Temp 97.5°F | Resp 12 | Ht 66.0 in | Wt 180.0 lb

## 2022-07-23 DIAGNOSIS — C8338 Diffuse large B-cell lymphoma, lymph nodes of multiple sites: Secondary | ICD-10-CM

## 2022-07-23 DIAGNOSIS — Z5189 Encounter for other specified aftercare: Secondary | ICD-10-CM | POA: Diagnosis not present

## 2022-07-23 DIAGNOSIS — C8339 Diffuse large B-cell lymphoma, extranodal and solid organ sites: Secondary | ICD-10-CM | POA: Diagnosis present

## 2022-07-23 DIAGNOSIS — Z5112 Encounter for antineoplastic immunotherapy: Secondary | ICD-10-CM | POA: Insufficient documentation

## 2022-07-23 MED ORDER — LIDOCAINE HCL (PF) 1 % IJ SOLN
10.0000 mL | Freq: Once | INTRAMUSCULAR | Status: AC
  Administered 2022-07-23: 5 mL

## 2022-07-23 MED ORDER — PEGFILGRASTIM INJECTION 6 MG/0.6ML ~~LOC~~
6.0000 mg | PREFILLED_SYRINGE | Freq: Once | SUBCUTANEOUS | Status: AC
  Administered 2022-07-23: 6 mg via SUBCUTANEOUS

## 2022-07-23 NOTE — Patient Instructions (Signed)
[facility]  Discharge Instructions: Thank you for choosing [facility] to provide your oncology and hematology care.  If you have a lab appointment with the [facility], please go directly to the [facility] and check in at the registration area.  Wear comfortable clothing and clothing appropriate for easy access to any Portacath or PICC line.   We strive to give you quality time with your provider. You may need to reschedule your appointment if you arrive late (15 or more minutes).  Arriving late affects you and other patients whose appointments are after yours.  Also, if you miss three or more appointments without notifying the office, you may be dismissed from the clinic at the provider's discretion.      For prescription refill requests, have your pharmacy contact our office and allow 72 hours for refills to be completed.    Today you received the following chemotherapy and/or immunotherapy agents NEULASTA       To help prevent nausea and vomiting after your treatment, we encourage you to take your nausea medication as directed.  BELOW ARE SYMPTOMS THAT SHOULD BE REPORTED IMMEDIATELY: *FEVER GREATER THAN 100.4 F (38 C) OR HIGHER *CHILLS OR SWEATING *NAUSEA AND VOMITING THAT IS NOT CONTROLLED WITH YOUR NAUSEA MEDICATION *UNUSUAL SHORTNESS OF BREATH *UNUSUAL BRUISING OR BLEEDING *URINARY PROBLEMS (pain or burning when urinating, or frequent urination) *BOWEL PROBLEMS (unusual diarrhea, constipation, pain near the anus) TENDERNESS IN MOUTH AND THROAT WITH OR WITHOUT PRESENCE OF ULCERS (sore throat, sores in mouth, or a toothache) UNUSUAL RASH, SWELLING OR PAIN  UNUSUAL VAGINAL DISCHARGE OR ITCHING   Items with * indicate a potential emergency and should be followed up as soon as possible or go to the Emergency Department if any problems should occur.  Please show the CHEMOTHERAPY ALERT CARD or IMMUNOTHERAPY ALERT CARD at check-in to  the Emergency Department and triage nurse.  Should you have questions after your visit or need to cancel or reschedule your appointment, please contact [facility]  [phone] and follow the prompts.  Office hours are 8:00 a.m. to 4:30 p.m. Monday - Friday. Please note that voicemails left after 4:00 p.m. may not be returned until the following business day.  We are closed weekends and major holidays. You have access to a nurse at all times for urgent questions. Please call the main number to the clinic [phone] and follow the prompts.  For any non-urgent questions, you may also contact your provider using MyChart. We now offer e-Visits for anyone 39 and older to request care online for non-urgent symptoms. For details visit [email or web address].   Also download the MyChart app! Go to the app store, search "MyChart", open the app, select [facility], and log in with your MyChart username and password.  Pegfilgrastim Injection What is this medication? PEGFILGRASTIM (PEG fil gra stim) lowers the risk of infection in people who are receiving chemotherapy. It works by Building control surveyor make more white blood cells, which protects your body from infection. It may also be used to help people who have been exposed to high doses of radiation. This medicine may be used for other purposes; ask your health care provider or pharmacist if you have questions. COMMON BRAND NAME(S): [employer], Neulasta, Nyvepria, Stimufend, UDENYCA, Ziextenzo What should I tell my care team before I take this medication? They need to know if you have any of these conditions: Kidney disease Latex allergy Ongoing  radiation therapy Sickle cell disease Skin reactions to acrylic adhesives (On-Body Injector only) An unusual or allergic reaction to pegfilgrastim, filgrastim, other medications, foods, dyes, or preservatives Pregnant or trying to get pregnant Breast-feeding How should  I use this medication? This medication is for injection under the skin. If you get this medication at home, you will be taught how to prepare and give the pre-filled syringe or how to use the On-body Injector. Refer to the patient Instructions for Use for detailed instructions. Use exactly as directed. Tell your care team immediately if you suspect that the On-body Injector may not have performed as intended or if you suspect the use of the On-body Injector resulted in a missed or partial dose. It is important that you put your used needles and syringes in a special sharps container. Do not put them in a trash can. If you do not have a sharps container, call your pharmacist or care team to get one. Talk to your care team about the use of this medication in children. While this medication may be prescribed for selected conditions, precautions do apply. Overdosage: If you think you have taken too much of this medicine contact a poison control center or emergency room at once. NOTE: This medicine is only for you. Do not share this medicine with others. What if I miss a dose? It is important not to miss your dose. Call your care team if you miss your dose. If you miss a dose due to an On-body Injector failure or leakage, a new dose should be administered as soon as possible using a single prefilled syringe for manual use. What may interact with this medication? Interactions have not been studied. This list may not describe all possible interactions. Give your health care provider a list of all the medicines, herbs, non-prescription drugs, or dietary supplements you use. Also tell them if you smoke, drink alcohol, or use illegal drugs. Some items may interact with your medicine. What should I watch for while using this medication? Your condition will be monitored carefully while you are receiving this medication. You may need blood work done while you are taking this medication. Talk to your care team about  your risk of cancer. You may be more at risk for certain types of cancer if you take this medication. If you are going to need a MRI, CT scan, or other procedure, tell your care team that you are using this medication (On-Body Injector only). What side effects may I notice from receiving this medication? Side effects that you should report to your care team as soon as possible: Allergic reactions--skin rash, itching, hives, swelling of the face, lips, tongue, or throat Capillary leak syndrome--stomach or muscle pain, unusual weakness or fatigue, feeling faint or lightheaded, decrease in the amount of urine, swelling of the ankles, hands, or feet, trouble breathing High white blood cell level--fever, fatigue, trouble breathing, night sweats, change in vision, weight loss Inflammation of the aorta--fever, fatigue, back, chest, or stomach pain, severe headache Kidney injury (glomerulonephritis)--decrease in the amount of urine, red or dark brown urine, foamy or bubbly urine, swelling of the ankles, hands, or feet Shortness of breath or trouble breathing Spleen injury--pain in upper left stomach or shoulder Unusual bruising or bleeding Side effects that usually do not require medical attention (report to your care team if they continue or are bothersome): Bone pain Pain in the hands or feet This list may not describe all possible side effects. Call your doctor for medical  advice about side effects. You may report side effects to FDA at 1-800-FDA-1088. Where should I keep my medication? Keep out of the reach of children. If you are using this medication at home, you will be instructed on how to store it. Throw away any unused medication after the expiration date on the label. NOTE: This sheet is a summary. It may not cover all possible information. If you have questions about this medicine, talk to your doctor, pharmacist, or health care provider.  2023 Elsevier/Gold Standard (2020-12-26  00:00:00)

## 2022-07-23 NOTE — Discharge Instructions (Signed)
Lumbar Puncture, Care After Refer to this sheet in the next few weeks. These instructions provide you with information on caring for yourself after your procedure. Your health care provider may also give you more specific instructions. Your treatment has been planned according to current medical practices, but problems sometimes occur. Call your health care provider if you have any problems or questions after your procedure. What can I expect after the procedure? After your procedure, it is typical to have the following sensations: Mild discomfort or pain at the insertion site. Mild headache that is relieved with pain medicines.  Follow these instructions at home:  Avoid lifting anything heavier than 10 lb (4.5 kg) for at least 12 hours after the procedure. Drink enough fluids to keep your urine clear or pale yellow. Lay flat or as flat as possible for the remainder of the day. Contact a health care provider if: You have fever or chills. You have nausea or vomiting. You have a headache that lasts for more than 2 days. Get help right away if: You have any numbness or tingling in your legs. You are unable to control your bowel or bladder. You have bleeding or swelling in your back at the insertion site. You are dizzy or faint. This information is not intended to replace advice given to you by your health care provider. Make sure you discuss any questions you have with your health care provider. Document Released: 06/13/2013 Document Revised: 11/14/2015 Document Reviewed: 02/14/2013 Elsevier Interactive Patient Education  2017 [employer] 

## 2022-08-03 ENCOUNTER — Encounter: Payer: Self-pay | Admitting: Oncology

## 2022-08-10 ENCOUNTER — Inpatient Hospital Stay: Payer: 59

## 2022-08-10 ENCOUNTER — Encounter: Payer: Self-pay | Admitting: Oncology

## 2022-08-10 ENCOUNTER — Inpatient Hospital Stay (HOSPITAL_BASED_OUTPATIENT_CLINIC_OR_DEPARTMENT_OTHER): Payer: 59 | Admitting: Oncology

## 2022-08-10 VITALS — BP 126/77 | HR 81 | Temp 97.4°F | Resp 18 | Wt 187.6 lb

## 2022-08-10 VITALS — BP 107/76 | HR 84 | Resp 18

## 2022-08-10 DIAGNOSIS — C8338 Diffuse large B-cell lymphoma, lymph nodes of multiple sites: Secondary | ICD-10-CM

## 2022-08-10 DIAGNOSIS — Z5111 Encounter for antineoplastic chemotherapy: Secondary | ICD-10-CM

## 2022-08-10 DIAGNOSIS — C8339 Diffuse large B-cell lymphoma, extranodal and solid organ sites: Secondary | ICD-10-CM | POA: Diagnosis not present

## 2022-08-10 LAB — COMPREHENSIVE METABOLIC PANEL
ALT: 27 U/L (ref 0–44)
AST: 21 U/L (ref 15–41)
Albumin: 4.3 g/dL (ref 3.5–5.0)
Alkaline Phosphatase: 123 U/L (ref 38–126)
Anion gap: 9 (ref 5–15)
BUN: 27 mg/dL — ABNORMAL HIGH (ref 8–23)
CO2: 22 mmol/L (ref 22–32)
Calcium: 9 mg/dL (ref 8.9–10.3)
Chloride: 101 mmol/L (ref 98–111)
Creatinine, Ser: 0.94 mg/dL (ref 0.61–1.24)
GFR, Estimated: >60 mL/min (ref >60)
Glucose, Bld: 240 mg/dL — ABNORMAL HIGH (ref 70–99)
Potassium: 4.3 mmol/L (ref 3.5–5.1)
Sodium: 132 mmol/L — ABNORMAL LOW (ref 135–145)
Total Bilirubin: 0.5 mg/dL (ref 0.3–1.2)
Total Protein: 7 g/dL (ref 6.5–8.1)

## 2022-08-10 LAB — CBC WITH DIFFERENTIAL/PLATELET
Abs Immature Granulocytes: 0.17 10*3/uL — ABNORMAL HIGH (ref 0.00–0.07)
Basophils Absolute: 0.1 10*3/uL (ref 0.0–0.1)
Basophils Relative: 1 %
Eosinophils Absolute: 0.1 10*3/uL (ref 0.0–0.5)
Eosinophils Relative: 1 %
HCT: 35.8 % — ABNORMAL LOW (ref 39.0–52.0)
Hemoglobin: 12.4 g/dL — ABNORMAL LOW (ref 13.0–17.0)
Immature Granulocytes: 2 %
Lymphocytes Relative: 11 %
Lymphs Abs: 0.8 10*3/uL (ref 0.7–4.0)
MCH: 31.6 pg (ref 26.0–34.0)
MCHC: 34.6 g/dL (ref 30.0–36.0)
MCV: 91.1 fL (ref 80.0–100.0)
Monocytes Absolute: 1.2 10*3/uL — ABNORMAL HIGH (ref 0.1–1.0)
Monocytes Relative: 16 %
Neutro Abs: 5.2 10*3/uL (ref 1.7–7.7)
Neutrophils Relative %: 69 %
Platelets: 212 10*3/uL (ref 150–400)
RBC: 3.93 MIL/uL — ABNORMAL LOW (ref 4.22–5.81)
RDW: 14.6 % (ref 11.5–15.5)
WBC: 7.6 10*3/uL (ref 4.0–10.5)
nRBC: 0 % (ref 0.0–0.2)

## 2022-08-10 LAB — LACTATE DEHYDROGENASE: LDH: 138 U/L (ref 98–192)

## 2022-08-10 MED ORDER — SODIUM CHLORIDE 0.9 % IV SOLN
375.0000 mg/m2 | Freq: Once | INTRAVENOUS | Status: AC
  Administered 2022-08-10: 700 mg via INTRAVENOUS
  Filled 2022-08-10: qty 50

## 2022-08-10 MED ORDER — ACETAMINOPHEN 325 MG PO TABS
650.0000 mg | ORAL_TABLET | Freq: Once | ORAL | Status: AC
  Administered 2022-08-10: 650 mg via ORAL
  Filled 2022-08-10: qty 2

## 2022-08-10 MED ORDER — SODIUM CHLORIDE 0.9 % IV SOLN
Freq: Once | INTRAVENOUS | Status: AC
  Filled 2022-08-10: qty 250

## 2022-08-10 MED ORDER — DIPHENHYDRAMINE HCL 25 MG PO CAPS
50.0000 mg | ORAL_CAPSULE | Freq: Once | ORAL | Status: AC
  Administered 2022-08-10: 50 mg via ORAL
  Filled 2022-08-10: qty 2

## 2022-08-10 MED ORDER — FAMOTIDINE IN NACL 20-0.9 MG/50ML-% IV SOLN
20.0000 mg | Freq: Once | INTRAVENOUS | Status: AC
  Administered 2022-08-10: 20 mg via INTRAVENOUS
  Filled 2022-08-10: qty 50

## 2022-08-10 MED ORDER — HEPARIN SOD (PORK) LOCK FLUSH 100 UNIT/ML IV SOLN
500.0000 [IU] | Freq: Once | INTRAVENOUS | Status: AC | PRN
  Administered 2022-08-10: 500 [IU]
  Filled 2022-08-10: qty 5

## 2022-08-10 MED FILL — Fosaprepitant Dimeglumine For IV Infusion 150 MG (Base Eq): INTRAVENOUS | Qty: 5 | Status: AC

## 2022-08-10 NOTE — Progress Notes (Signed)
Pt here for follow up. No new concerns voiced.   

## 2022-08-10 NOTE — Patient Instructions (Signed)
[facility]  Discharge Instructions: Thank you for choosing [facility] to provide your oncology and hematology care.  If you have a lab appointment with the [facility], please go directly to the [facility] and check in at the registration area.  Wear comfortable clothing and clothing appropriate for easy access to any Portacath or PICC line.   We strive to give you quality time with your provider. You may need to reschedule your appointment if you arrive late (15 or more minutes).  Arriving late affects you and other patients whose appointments are after yours.  Also, if you miss three or more appointments without notifying the office, you may be dismissed from the clinic at the provider's discretion.      For prescription refill requests, have your pharmacy contact our office and allow 72 hours for refills to be completed.    Today you received the following chemotherapy and/or immunotherapy agents Rituximab      To help prevent nausea and vomiting after your treatment, we encourage you to take your nausea medication as directed.  BELOW ARE SYMPTOMS THAT SHOULD BE REPORTED IMMEDIATELY: *FEVER GREATER THAN 100.4 F (38 C) OR HIGHER *CHILLS OR SWEATING *NAUSEA AND VOMITING THAT IS NOT CONTROLLED WITH YOUR NAUSEA MEDICATION *UNUSUAL SHORTNESS OF BREATH *UNUSUAL BRUISING OR BLEEDING *URINARY PROBLEMS (pain or burning when urinating, or frequent urination) *BOWEL PROBLEMS (unusual diarrhea, constipation, pain near the anus) TENDERNESS IN MOUTH AND THROAT WITH OR WITHOUT PRESENCE OF ULCERS (sore throat, sores in mouth, or a toothache) UNUSUAL RASH, SWELLING OR PAIN  UNUSUAL VAGINAL DISCHARGE OR ITCHING   Items with * indicate a potential emergency and should be followed up as soon as possible or go to the Emergency Department if any problems should occur.  Please show the CHEMOTHERAPY ALERT CARD or IMMUNOTHERAPY ALERT CARD at check-in to  the Emergency Department and triage nurse.  Should you have questions after your visit or need to cancel or reschedule your appointment, please contact [facility]  [phone] and follow the prompts.  Office hours are 8:00 a.m. to 4:30 p.m. Monday - Friday. Please note that voicemails left after 4:00 p.m. may not be returned until the following business day.  We are closed weekends and major holidays. You have access to a nurse at all times for urgent questions. Please call the main number to the clinic [phone] and follow the prompts.  For any non-urgent questions, you may also contact your provider using MyChart. We now offer e-Visits for anyone 46 and older to request care online for non-urgent symptoms. For details visit [email or web address].   Also download the MyChart app! Go to the app store, search "MyChart", open the app, select [facility], and log in with your MyChart username and password.

## 2022-08-10 NOTE — Assessment & Plan Note (Signed)
Treatment plan as listed above

## 2022-08-10 NOTE — Progress Notes (Signed)
Hematology/Oncology Progress note [phone] [phone]       Patient Care Team: Ranae Plumber, [place] as PCP - General (Family Medicine) Ranae Plumber, [place] (Family Medicine) Christene Lye, MD (General Surgery) Clent Jacks, RN as Oncology Nurse Navigator  ASSESSMENT & PLAN:   Cancer Staging  Diffuse large B cell lymphoma [facility]) Staging form: Hodgkin and Non-Hodgkin Lymphoma, AJCC 8th Edition - Clinical stage from 03/31/2022: Stage IV (Diffuse large B-cell lymphoma) - Signed by [name], MD on 04/19/2022   Diffuse large B cell lymphoma ([name]) BCL6 cmyc IHC positive, FISH negative for BCL2, BCL6, and CMYC rearrangement.  PET scan was reviewed and discussed with patient.- liver, spleen, possible  thyroid,  involvements.  Marrow is negative on PET.  bone marrow biopsy is negative.   IPI score is 4. CNS IPI - 4 Interim PET showed CR.  Labs are reviewed and discussed with patient. Proceed with cycle 6 R-Pola -CHP with GCSF - IT MTX/hydrocortisone.  Recommend patient to take D2-5 prednisone continue and finish currently supply of allopurinol and acyclovir.  Repeat PET restaging 8 weeks after chemo.   Encounter for antineoplastic chemotherapy Treatment plan as listed above  Orders Placed This Encounter  Procedures   NM PET Image Restag (PS) Skull Base To Thigh    Standing Status:   Future    Standing Expiration Date:   08/10/2023    Order Specific Question:   If indicated for the ordered procedure, I authorize the administration of a radiopharmaceutical per Radiology protocol    Answer:   Yes    Order Specific Question:   Preferred imaging location?    Answer:   [facility]   CBC with Differential ([place] Only)    Standing Status:   Future    Standing Expiration Date:   08/11/2023   CMP ([place] only)    Standing Status:   Future    Standing Expiration Date:   08/11/2023   CMP ([place] only)    Standing Status:   Future     Standing Expiration Date:   08/11/2023   CBC with Differential (Cancer Center Only)    Standing Status:   Future    Standing Expiration Date:   08/11/2023   Lactate dehydrogenase    Standing Status:   Future    Standing Expiration Date:   08/11/2023    Follow up  PET in 8-10 weeks.  Follow up after PET  All questions were answered. The patient knows to call the clinic with any problems, questions or concerns.  [name], MD, PhD [facility] Hematology Oncology 08/10/2022    CHIEF COMPLAINTS/REASON FOR VISIT:  Stage IV DLBCL  HISTORY OF PRESENTING ILLNESS:   03/22/2022, patient presented to emergency room for evaluation of right side lower chest/upper abdomen/flank pain, intermittent low-grade fever and chills, night sweats and unintentional weight loss of 20 to 30 pounds over the past 6 months.  He finished a course of antibiotics a few months ago for unknown infection and symptom has not improved.  Oncology History  Diffuse large B cell lymphoma ([place])  03/22/2022 Imaging   CT chest angiogram, CT abdomen pelvis w contrast  1. No acute pulmonary embolism. 2. There are multiple heterogeneously enhancing masses throughout the liver, several which demonstrate capsular retraction. No definitive extrahepatic primary is identified. Findings could reflect multifocal hepatocellular carcinoma/cholangiocarcinoma, lymphoma or abscess. Recommend correlation with blood chemistry values. Liver MRI with and without contrast could be of assistance in further characterizing  these masses. 3. Splenomegaly.4. Multiple borderline enlarged lymph nodes above and below the diaphragm. 5. Homogeneously enlarged thyroid gland, nonspecific. Consider further dedicated evaluation with nonemergent thyroid ultrasound. 6. Mild bronchial wall thickening with bibasilar atelectasis. This could reflect a nonspecific bronchitis.   03/22/2022 Imaging   MRI liver w wo contrast 1. Bilobar hepatic lesions demonstrate  irregular contour with minimally increased T2 signal, hypointense intrinsic T1 signal, postcontrast enhancement that is slowly increases and is most prominent in intensities 5 minutes postcontrast administration as well as a peripheral rim of reduced diffusivity.  2. Enlarged upper abdominal lymph nodes, likely reflect metastatic nodal disease involvement. 3. Enhancing lesions in the right tenth rib and L2 vertebral body,concerning for osseous metastatic disease. 4. Prominent subcentimeter pericardiophrenic and internal mammary lymph nodes also suspicious for nodal metastatic disease.    03/31/2022 Cancer Staging   Staging form: Hodgkin and Non-Hodgkin Lymphoma, AJCC 8th Edition - Clinical stage from 03/31/2022: Stage IV (Diffuse large B-cell lymphoma) - Signed by [name], MD on 04/19/2022 Stage prefix: Initial diagnosis International Prognostic Index (IPI) score: Score 4 International Prognostic Index risk group: High risk DLBCL cell of origin (COO): Activated B-cell   04/03/2022 Initial Diagnosis   Large cell lymphoma ([place])  04/03/2022, ultrasound-guided liver biopsy showed large B-cell lymphoma.  Additional IHC is pending for subclassification as well as ancillary FISH testing for rearrangementds   04/16/2022 Imaging   PET initial staging showed 1. Multiple bulky intensely hypermetabolic liver masses throughout both liver lobes, compatible with reported lymphoma. Deauville score 5. 2. Mild splenomegaly with mild splenic hypermetabolism, compatible with lymphoma. No enlarged or hypermetabolic lymph nodes. No hypermetabolic skeletal activity. 3. 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 4. Chronic findings include: Aortic Atherosclerosis.Mild sigmoid diverticulosis.     04/20/2022 Procedure   Medi port placed by IR   04/22/2022 -  Chemotherapy   Patient is on Treatment Plan : NON-HODGKINS LYMPHOMA R-POLA CHP q21d      06/05/2022 Imaging   Restaging PET scan showed -PET-CT findings consistent with a complete metabolic response. No residual hypermetabolism involving the liver (Deauville 1). All measurable liver lesions are much smaller.   Mild residual FDG uptake in the thyroid gland. This could be due to treated thyroiditis. Treated thyroid gland involvement with lymphoma is also possible (Deauville 2).      INTERVAL HISTORY Joe Adams is a 62 y.o. male who has above history reviewed by me today presents for follow up visit for large B-cell lymphoma.   Status post 4 cycles  R- Pola CHP treatments.   Overall he tolerates well.   Weight is stable. He has pain in bilateral lower extremities after GCSF injections, he feels it is manageable, not taking any pain medication currently. He takes Claritin.  No new complaints.   MEDICAL HISTORY:  Past Medical History:  Diagnosis Date   Acute MI ([place]) 2005   Diabetes mellitus without complication (HCC)    GERD (gastroesophageal reflux disease)    Headache    Hernia, umbilical    History of kidney stones    Large cell lymphoma ([place]) 04/03/2022   Thyroid disease     SURGICAL HISTORY: Past Surgical History:  Procedure Laterality Date   COLONOSCOPY N/A 11/18/2016   Procedure: COLONOSCOPY IN O.R;  Surgeon: Christene Lye, MD;  Location: [facility] ORS;  Service: General;  Laterality: N/A;   COLONOSCOPY WITH PROPOFOL N/A 11/18/2016   Procedure: COLONOSCOPY WITH PROPOFOL;  Surgeon: Christene Lye, MD;  Location: [facility] ENDOSCOPY;  Service: Endoscopy;  Laterality: N/A;   HEMORRHOID SURGERY N/A 11/18/2016   Procedure: HEMORRHOIDECTOMY;  Surgeon: Christene Lye, MD;  Location: [facility] ORS;  Service: General;  Laterality: N/A;   IR IMAGING GUIDED PORT INSERTION  04/20/2022   TONSILLECTOMY     as a child    SOCIAL HISTORY: Social History   Socioeconomic History   Marital status: Legally Separated    Spouse name: Not on file   Number of  children: Not on file   Years of education: Not on file   Highest education level: Not on file  Occupational History   Not on file  Tobacco Use   Smoking status: Never   Smokeless tobacco: Never  Vaping Use   Vaping Use: Never used  Substance and Sexual Activity   Alcohol use: Not Currently    Alcohol/week: 1.0 standard drink of alcohol    Types: 1 Shots of liquor per week    Comment: occ   Drug use: No   Sexual activity: Not on file  Other Topics Concern   Not on file  Social History Narrative   Not on file   Social Determinants of Health   Financial Resource Strain: Low Risk  (04/15/2022)   Overall Financial Resource Strain (CARDIA)    Difficulty of Paying Living Expenses: Not very hard  Recent Concern: Financial Resource Strain - Medium Risk (04/15/2022)   Overall Financial Resource Strain (CARDIA)    Difficulty of Paying Living Expenses: Somewhat hard  Food Insecurity: No Food Insecurity (04/15/2022)   Hunger Vital Sign    Worried About [place] in the Last Year: Never true    Ran Out of Food in the Last Year: Never true  Transportation Needs: No Transportation Needs (06/16/2022)   PRAPARE - Hydrologist (Medical): No    Lack of Transportation (Non-Medical): No  Physical Activity: Inactive (04/15/2022)   Exercise Vital Sign    Days of Exercise per Week: 0 days    Minutes of Exercise per Session: 0 min  Stress: Stress Concern Present (04/15/2022)   [place]    Feeling of Stress : To some extent  Social Connections: Moderately Integrated (04/15/2022)   Social Connection and Isolation Panel [NHANES]    Frequency of Communication with Friends and Family: Three times a week    Frequency of Social Gatherings with Friends and Family: Twice a week    Attends Religious Services: 1 to 4 times per year    Active Member of [employer] or Organizations: No    Attends Theatre manager Meetings: Never    Marital Status: Living with partner  Intimate Partner Violence: Not At Risk (04/15/2022)   Humiliation, Afraid, Rape, and Kick questionnaire    Fear of Current or Ex-Partner: No    Emotionally Abused: No    Physically Abused: No    Sexually Abused: No    FAMILY HISTORY: Family History  Problem Relation Age of Onset   Cancer Other    Cancer Paternal Uncle     ALLERGIES:  has No Known Allergies.  MEDICATIONS:  Current Outpatient Medications  Medication Sig Dispense Refill   Acetaminophen (TYLENOL EXTRA STRENGTH PO) Take 1 tablet by mouth every 6 (six) hours as needed (constipation).     acyclovir (ZOVIRAX) 400 MG tablet Take 1 tablet (400 mg total) by mouth daily. 30 tablet 3   allopurinol (ZYLOPRIM) 100 MG tablet  Take 1 tablet (100 mg total) by mouth daily. 30 tablet 0   Docusate Calcium (STOOL SOFTENER PO) Take by mouth.     Glucose Blood (TRUE METRIX BLOOD GLUCOSE TEST VI) as directed.     LANTUS SOLOSTAR 100 UNIT/ML Solostar Pen Inject into the skin. Pt is on a sliding scale; 20 units if blood glucose is at 200; takes 25 units if blood glucose on is 225     levothyroxine (SYNTHROID) 150 MCG tablet Take 150 mcg by mouth daily.     lidocaine-prilocaine (EMLA) cream Apply to affected area once 30 g 3   loratadine (CLARITIN) 10 MG tablet Take 10 mg by mouth daily.     metFORMIN (GLUCOPHAGE) 1000 MG tablet 2 (two) times daily.     predniSONE (DELTASONE) 20 MG tablet Take 5 tablets (100 mg total) by mouth daily. Take with food on days 2-5 of chemotherapy. 20 tablet 5   TRUEPLUS 5-BEVEL PEN NEEDLES 31G X 6 MM MISC      HYDROcodone-acetaminophen (NORCO/VICODIN) 5-325 MG tablet Take 1 tablet by mouth every 6 (six) hours as needed. (Patient not taking: Reported on 06/16/2022)     lactulose (CHRONULAC) 10 GM/15ML solution Take 15 mLs (10 g total) by mouth daily as needed for mild constipation. (Patient not taking: Reported on 07/20/2022) 236 mL 0    ondansetron (ZOFRAN) 8 MG tablet Take 1 tablet (8 mg total) by mouth every 8 (eight) hours as needed for nausea or vomiting. Start on the third day after cyclophosphamide chemotherapy. (Patient not taking: Reported on 08/10/2022) 30 tablet 1   oxyCODONE-acetaminophen (PERCOCET) 5-325 MG tablet Take 1 tablet by mouth every 6 (six) hours as needed for severe pain. (Patient not taking: Reported on 07/20/2022) 60 tablet 0   prochlorperazine (COMPAZINE) 10 MG tablet Take 1 tablet (10 mg total) by mouth every 6 (six) hours as needed for nausea or vomiting. (Patient not taking: Reported on 06/16/2022) 30 tablet 6   No current facility-administered medications for this visit.    Review of Systems  Constitutional:  Positive for fatigue. Negative for appetite change, chills, fever and unexpected weight change.  HENT:   Negative for voice change.        Hair loss  Eyes:  Negative for eye problems and icterus.  Respiratory:  Negative for chest tightness, cough and shortness of breath.   Cardiovascular:  Negative for chest pain and leg swelling.  Gastrointestinal:  Negative for abdominal distention and abdominal pain.  Endocrine: Negative for hot flashes.  Genitourinary:  Negative for difficulty urinating, dysuria and frequency.   Musculoskeletal:  Negative for arthralgias.  Skin:  Negative for itching and rash.  Neurological:  Negative for light-headedness and numbness.  Hematological:  Negative for adenopathy. Does not bruise/bleed easily.  Psychiatric/Behavioral:  Negative for confusion.    PHYSICAL EXAMINATION: ECOG PERFORMANCE STATUS: 1 - Symptomatic but completely ambulatory Vitals:   08/10/22 0839  BP: 126/77  Pulse: 81  Resp: 18  Temp: (!) 97.4 F (36.3 C)   Filed Weights   08/10/22 0839  Weight: 187 lb 9.6 oz (85.1 kg)    Physical Exam Constitutional:      General: He is not in acute distress. HENT:     Head: Normocephalic and atraumatic.  Eyes:     General: No scleral  icterus. Cardiovascular:     Rate and Rhythm: Normal rate.  Pulmonary:     Effort: Pulmonary effort is normal. No respiratory distress.     Breath sounds: No  wheezing.  Abdominal:     General: There is no distension.  Musculoskeletal:        General: No deformity. Normal range of motion.     Cervical back: Normal range of motion and neck supple.  Skin:    General: Skin is warm.     Findings: No erythema or rash.  Neurological:     Mental Status: He is alert and oriented to person, place, and time. Mental status is at baseline.     Cranial Nerves: No cranial nerve deficit.     Coordination: Coordination normal.  Psychiatric:        Mood and Affect: Mood normal.     LABORATORY DATA:  I have reviewed the data as listed    Latest Ref Rng & Units 08/10/2022    8:22 AM 07/20/2022    8:20 AM 06/29/2022    8:21 AM  CBC  WBC 4.0 - 10.5 K/uL 7.6  7.3  8.2   Hemoglobin 13.0 - 17.0 g/dL 12.4  12.5  12.6   Hematocrit 39.0 - 52.0 % 35.8  36.1  35.1   Platelets 150 - 400 K/uL 212  224  178       Latest Ref Rng & Units 08/10/2022    8:22 AM 07/20/2022    8:20 AM 06/29/2022    8:21 AM  CMP  Glucose 70 - 99 mg/dL 240  193  166   BUN 8 - 23 mg/dL 27  21  27   $ Creatinine 0.61 - 1.24 mg/dL 0.94  0.80  1.01   Sodium 135 - 145 mmol/L 132  135  132   Potassium 3.5 - 5.1 mmol/L 4.3  4.1  3.9   Chloride 98 - 111 mmol/L 101  103  103   CO2 22 - 32 mmol/L 22  24  22   $ Calcium 8.9 - 10.3 mg/dL 9.0  9.3  8.6   Total Protein 6.5 - 8.1 g/dL 7.0  6.9  7.0   Total Bilirubin 0.3 - 1.2 mg/dL 0.5  0.6  0.5   Alkaline Phos 38 - 126 U/L 123  87  101   AST 15 - 41 U/L 21  20  26   $ ALT 0 - 44 U/L 27  15  19       $ RADIOGRAPHIC STUDIES: I have personally reviewed the radiological images as listed and agreed with the findings in the report. US BIOPSY (LIVER)  Result Date: 03/31/2022 INDICATION: 62 year old male with multifocal liver masses of indeterminate etiology. EXAM: ULTRASOUND GUIDED LIVER LESION  BIOPSY COMPARISON:  None Available. MEDICATIONS: None ANESTHESIA/SEDATION: Fentanyl 50 mcg IV; Versed 1 mg IV Total Moderate Sedation time:  10 minutes. The patient's level of consciousness and vital signs were monitored continuously by radiology nursing throughout the procedure under my direct supervision. COMPLICATIONS: None immediate. PROCEDURE: Informed written consent was obtained from the patient after a discussion of the risks, benefits and alternatives to treatment. The patient understands and consents the procedure. A timeout was performed prior to the initiation of the procedure. Ultrasound scanning was performed of the right upper abdominal quadrant demonstrates multifocal heterogeneously solid masses throughout the liver. A subcapsular right lobe mass was selected for biopsy and the procedure was planned. The right upper abdominal quadrant was prepped and draped in the usual sterile fashion. The overlying soft tissues were anesthetized with 1% lidocaine with epinephrine. A 17 gauge, 6.8 cm co-axial needle was advanced into a peripheral aspect of the lesion. This was followed  by 4 core biopsies with an 18 gauge core device under direct ultrasound guidance. The coaxial needle tract was embolized with a small amount of Gel-Foam slurry and superficial hemostasis was obtained with manual compression. Post procedural scanning was negative for definitive area of hemorrhage or additional complication. A dressing was placed. The patient tolerated the procedure well without immediate post procedural complication. IMPRESSION: Technically successful ultrasound guided core needle biopsy of right lobe liver mass. Ruthann Cancer, MD [facility] Electronically Signed   By: Ruthann Cancer M.D.   On: 03/31/2022 10:49   MR LIVER W WO CONTRAST  Result Date: 03/22/2022 CLINICAL DATA:  Further evaluation of hepatic lesions seen on prior CT. EXAM: MRI ABDOMEN WITHOUT  AND WITH CONTRAST TECHNIQUE: Multiplanar multisequence MR imaging of the abdomen was performed both before and after the administration of intravenous contrast. CONTRAST:  7cc of Vueway COMPARISON:  Multiple priors including most recent CT chest abdomen pelvis dated March 22, 2022. FINDINGS: Lower chest: No acute abnormality. Hepatobiliary: Bilobar hepatic lesions demonstrate irregular contour with minimally increased T2 signal and hypointense intrinsic T1 signal demonstrating postcontrast enhancement which slowly increases over progressive postcontrast pulse sequences most dominant on the 5 minute delayed and with a peripheral rim of reduced diffusivity. For reference: -lesion in the central right lobe of the liver measures 9.2 x 7.2 cm on image 49/13 -segment IVa/VIII hepatic lesion measures 7.9 x 5.4 cm on image 31/13 -segment II hepatic lesion measures 3.4 x 2.7 cm on image 21/13. No evidence of portal venous involvement. Gallbladder is unremarkable.  No biliary ductal dilation. Pancreas: No pancreatic ductal dilation or evidence of acute inflammation. No suspicious pancreatic mass identified. Spleen:  No splenomegaly or focal splenic lesion. Adrenals/Urinary Tract: Bilateral adrenal glands are within normal limits. No hydronephrosis. No solid enhancing renal mass. Stomach/Bowel: Visualized portions within the abdomen are unremarkable. Vascular/Lymphatic: Normal caliber abdominal aorta. The portal, splenic and superior mesenteric veins are patent. Enlarged retroperitoneal, portacaval, periportal, and gastrohepatic ligament lymph nodes. For reference: -portacaval lymph node measures 13 mm in short axis on image 49/16. -periportal lymph node measures 10 mm in short axis on image 44/16. Prominent subcentimeter pericardiophrenic and internal mammary lymph nodes. For reference: -A lymph node along the inferior aspect of the internal mammary artery on the right below the xiphoid process measures 8 mm in short axis on  image 18/22 -A pericardiophrenic lymph node measures 7 mm in short axis on image 88/22. Other:  No significant abdominopelvic free fluid. Musculoskeletal: Enhancing lesion in the right tenth rib on image 38/22. Enhancing lesion in the L2 vertebral body on image 56/22. IMPRESSION: 1. Bilobar hepatic lesions demonstrate irregular contour with minimally increased T2 signal, hypointense intrinsic T1 signal, postcontrast enhancement that is slowly increases and is most prominent in intensities 5 minutes postcontrast administration as well as a peripheral rim of reduced diffusivity. 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 With alternate differential consideration of atypical hepatocellular carcinoma. Recommend oncology referral with multidisciplinary tumor board consultation and definitive diagnosis with direct tissue sampling. 2. Enlarged upper abdominal lymph nodes, likely reflect metastatic nodal disease involvement. 3. Enhancing lesions in the right tenth rib and L2 vertebral body, concerning for osseous metastatic disease. 4. Prominent subcentimeter pericardiophrenic and internal mammary lymph nodes also suspicious for nodal metastatic disease. Electronically Signed   By: Dahlia Bailiff M.D.   On: 03/22/2022 19:52   CT Angio Chest PE W/Cm &/Or Wo Cm  Result Date: 03/22/2022 CLINICAL DATA:  Abdominal pain, acute, nonlocalized; Pulmonary embolism (PE) suspected, high prob EXAM: CT ANGIOGRAPHY CHEST CT ABDOMEN AND PELVIS WITH CONTRAST TECHNIQUE: Multidetector CT imaging of the chest was performed using the standard protocol during bolus administration of intravenous contrast. Multiplanar CT image reconstructions and MIPs were obtained to evaluate the vascular anatomy. Multidetector CT imaging of the abdomen and pelvis was performed using the standard protocol during bolus administration of intravenous contrast. body onc RADIATION DOSE REDUCTION: This exam was performed  according to the departmental dose-optimization program which includes automated exposure control, adjustment of the mA and/or kV according to patient size and/or use of iterative reconstruction technique. CONTRAST:  63m OMNIPAQUE IOHEXOL 350 MG/ML SOLN COMPARISON:  Oct 31, 2014 FINDINGS: CTA CHEST FINDINGS Cardiovascular: Satisfactory opacification of the pulmonary arteries to the segmental level. No evidence of pulmonary embolism. Mildly enlarged heart size. No pericardial effusion. Mediastinum/Nodes: Homogeneously enlarged thyroid gland. There are multiple borderline enlarged mediastinal lymph nodes. RIGHT paratracheal lymph node measures 11 mm in the short axis (series 507, image 55). No axillary adenopathy or hilar adenopathy. Lungs/Pleura: No pleural effusion or pneumothorax. Mild bronchial wall thickening with scattered areas of endobronchial debris. Bibasilar atelectasis. Musculoskeletal: No aggressive osseous abnormality. Review of the MIP images confirms the above findings. CT ABDOMEN and PELVIS FINDINGS Hepatobiliary: There are multiple heterogeneously enhancing masses throughout the liver. Representative mass within hepatic segment 5/6 spans approximately 8.6 cm (series 11, image 33). Representative heterogeneously enhancing mass spanning hepatic segments 6/7 with associated capsular retraction spans at least 11.8 cm (series 14, image 70). Gallbladder is unremarkable. Portal vein is patent. Pancreas: Unremarkable. No pancreatic ductal dilatation or surrounding inflammatory changes. Spleen: Splenomegaly. Adrenals/Urinary Tract: Adrenal glands are unremarkable. Kidneys are normal, without renal calculi, focal lesion, or hydronephrosis. Bladder is unremarkable. Stomach/Bowel: Stomach is within normal limits. Appendix appears normal. No evidence of bowel wall thickening, distention, or inflammatory changes. Vascular/Lymphatic: Abdominal aorta is normal in course and caliber. Mildly prominent porta hepatic  lymph node measures 15 mm in the short axis (series 11, image 28). Prominent LEFT periaortic lymph node measures 9 mm in the short axis (series 11, image 38). Reproductive: Coarse calcifications of the prostate. Other: No free air or free fluid. Musculoskeletal: No aggressive osseous lesions. Limbus vertebra of L5. Review of the MIP images confirms the above findings. IMPRESSION: 1. No acute pulmonary embolism. 2. There are multiple heterogeneously enhancing masses throughout the liver, several which demonstrate capsular retraction. No definitive extrahepatic primary is identified. Findings could reflect multifocal hepatocellular carcinoma/cholangiocarcinoma, lymphoma or abscess. Recommend correlation with blood chemistry values. Liver MRI with and without contrast could be of assistance in further characterizing these masses. 3. Splenomegaly. 4. Multiple borderline enlarged lymph nodes above and below the diaphragm. 5. Homogeneously enlarged thyroid gland, nonspecific. Consider further dedicated evaluation with nonemergent thyroid ultrasound. 6. Mild bronchial wall thickening with bibasilar atelectasis. This could reflect a nonspecific bronchitis. These results were called by telephone at the time of interpretation on 03/22/2022 at 4:39 pm to provider Dr. GArchie Balboa who verbally acknowledged these results. Electronically Signed   By: SValentino SaxonM.D.   On: 03/22/2022 16:47   CT Abdomen Pelvis W Contrast  Result Date: 03/22/2022 CLINICAL DATA:  Abdominal pain, acute, nonlocalized; Pulmonary embolism (PE) suspected, high prob EXAM: CT ANGIOGRAPHY CHEST CT ABDOMEN AND PELVIS WITH CONTRAST TECHNIQUE: Multidetector CT imaging of the chest was performed using the standard protocol during bolus administration of intravenous contrast. Multiplanar CT image reconstructions and MIPs were obtained to evaluate the vascular anatomy. Multidetector  CT imaging of the abdomen and pelvis was performed using the standard  protocol during bolus administration of intravenous contrast. body onc RADIATION DOSE REDUCTION: This exam was performed according to the departmental dose-optimization program which includes automated exposure control, adjustment of the mA and/or kV according to patient size and/or use of iterative reconstruction technique. CONTRAST:  61m OMNIPAQUE IOHEXOL 350 MG/ML SOLN COMPARISON:  Oct 31, 2014 FINDINGS: CTA CHEST FINDINGS Cardiovascular: Satisfactory opacification of the pulmonary arteries to the segmental level. No evidence of pulmonary embolism. Mildly enlarged heart size. No pericardial effusion. Mediastinum/Nodes: Homogeneously enlarged thyroid gland. There are multiple borderline enlarged mediastinal lymph nodes. RIGHT paratracheal lymph node measures 11 mm in the short axis (series 507, image 55). No axillary adenopathy or hilar adenopathy. Lungs/Pleura: No pleural effusion or pneumothorax. Mild bronchial wall thickening with scattered areas of endobronchial debris. Bibasilar atelectasis. Musculoskeletal: No aggressive osseous abnormality. Review of the MIP images confirms the above findings. CT ABDOMEN and PELVIS FINDINGS Hepatobiliary: There are multiple heterogeneously enhancing masses throughout the liver. Representative mass within hepatic segment 5/6 spans approximately 8.6 cm (series 11, image 33). Representative heterogeneously enhancing mass spanning hepatic segments 6/7 with associated capsular retraction spans at least 11.8 cm (series 14, image 70). Gallbladder is unremarkable. Portal vein is patent. Pancreas: Unremarkable. No pancreatic ductal dilatation or surrounding inflammatory changes. Spleen: Splenomegaly. Adrenals/Urinary Tract: Adrenal glands are unremarkable. Kidneys are normal, without renal calculi, focal lesion, or hydronephrosis. Bladder is unremarkable. Stomach/Bowel: Stomach is within normal limits. Appendix appears normal. No evidence of bowel wall thickening, distention, or  inflammatory changes. Vascular/Lymphatic: Abdominal aorta is normal in course and caliber. Mildly prominent porta hepatic lymph node measures 15 mm in the short axis (series 11, image 28). Prominent LEFT periaortic lymph node measures 9 mm in the short axis (series 11, image 38). Reproductive: Coarse calcifications of the prostate. Other: No free air or free fluid. Musculoskeletal: No aggressive osseous lesions. Limbus vertebra of L5. Review of the MIP images confirms the above findings. IMPRESSION: 1. No acute pulmonary embolism. 2. There are multiple heterogeneously enhancing masses throughout the liver, several which demonstrate capsular retraction. No definitive extrahepatic primary is identified. Findings could reflect multifocal hepatocellular carcinoma/cholangiocarcinoma, lymphoma or abscess. Recommend correlation with blood chemistry values. Liver MRI with and without contrast could be of assistance in further characterizing these masses. 3. Splenomegaly. 4. Multiple borderline enlarged lymph nodes above and below the diaphragm. 5. Homogeneously enlarged thyroid gland, nonspecific. Consider further dedicated evaluation with nonemergent thyroid ultrasound. 6. Mild bronchial wall thickening with bibasilar atelectasis. This could reflect a nonspecific bronchitis. These results were called by telephone at the time of interpretation on 03/22/2022 at 4:39 pm to provider Dr. GArchie Balboa who verbally acknowledged these results. Electronically Signed   By: SValentino SaxonM.D.   On: 03/22/2022 16:47   CT Head Wo Contrast  Result Date: 03/22/2022 CLINICAL DATA:  Dizziness, persistent/recurrent, cardiac or vascular cause suspected EXAM: CT HEAD WITHOUT CONTRAST TECHNIQUE: Contiguous axial images were obtained from the base of the skull through the vertex without intravenous contrast. RADIATION DOSE REDUCTION: This exam was performed according to the departmental dose-optimization program which includes automated  exposure control, adjustment of the mA and/or kV according to patient size and/or use of iterative reconstruction technique. COMPARISON:  Nov 17, 2004 FINDINGS: Brain: No evidence of acute infarction, hemorrhage, hydrocephalus, extra-axial collection or mass lesion/mass effect. Periventricular white matter hypodensities consistent with sequela of chronic microvascular ischemic disease. Vascular: No hyperdense vessel or unexpected calcification. Skull: No acute  fracture. 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 Sinuses/Orbits: No acute finding. Other: None. IMPRESSION: No acute intracranial abnormality. Electronically Signed   By: Valentino Saxon M.D.   On: 03/22/2022 16:14   DG Chest 1 View  Result Date: 03/22/2022 CLINICAL DATA:  Dizziness and right-sided pain at night. Intermittent fevers and unexpected weight loss. EXAM: CHEST  1 VIEW COMPARISON:  Chest radiograph 12/29/2010, 12/13/2007 FINDINGS: Borderline enlargement of the cardiac silhouette, which could exaggerated secondary to portable technique. Subtle interstitial thickening in the lower lungs. No pleural effusion or pneumothorax. Visualized osseous structures are unremarkable. IMPRESSION: Subtle interstitial thickening in the lower lungs may represent subsegmental atelectasis or possibly mild pulmonary edema. No lobar consolidation or pleural effusion. Electronically Signed   By: Ileana Roup M.D.   On: 03/22/2022 13:51

## 2022-08-10 NOTE — Assessment & Plan Note (Addendum)
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  PET scan was reviewed and discussed with patient.- liver, spleen, possible  thyroid,  involvements.  Marrow is negative on PET.  bone marrow biopsy is negative.   IPI score is 4. CNS IPI - 4 Interim PET showed CR.  Labs are reviewed and discussed with patient. Proceed with cycle 6 R-Pola -CHP with GCSF - IT MTX/hydrocortisone.  Recommend patient to take D2-5 prednisone continue and finish currently supply of allopurinol and acyclovir.  Repeat PET restaging 8 weeks after chemo.

## 2022-08-11 ENCOUNTER — Inpatient Hospital Stay: Payer: 59

## 2022-08-11 VITALS — BP 128/78 | HR 85 | Temp 97.8°F | Resp 18

## 2022-08-11 DIAGNOSIS — C8338 Diffuse large B-cell lymphoma, lymph nodes of multiple sites: Secondary | ICD-10-CM

## 2022-08-11 DIAGNOSIS — C8339 Diffuse large B-cell lymphoma, extranodal and solid organ sites: Secondary | ICD-10-CM | POA: Diagnosis not present

## 2022-08-11 MED ORDER — ACETAMINOPHEN 325 MG PO TABS
650.0000 mg | ORAL_TABLET | Freq: Once | ORAL | Status: AC
  Administered 2022-08-11: 650 mg via ORAL
  Filled 2022-08-11: qty 2

## 2022-08-11 MED ORDER — SODIUM CHLORIDE 0.9 % IV SOLN
15.0000 mg | Freq: Once | INTRAVENOUS | Status: AC
  Administered 2022-08-11: 15 mg via INTRAVENOUS
  Filled 2022-08-11: qty 1.5

## 2022-08-11 MED ORDER — SODIUM CHLORIDE 0.9 % IV SOLN
Freq: Once | INTRAVENOUS | Status: AC
  Filled 2022-08-11: qty 250

## 2022-08-11 MED ORDER — SODIUM CHLORIDE 0.9 % IV SOLN
150.0000 mg | Freq: Once | INTRAVENOUS | Status: AC
  Administered 2022-08-11: 150 mg via INTRAVENOUS
  Filled 2022-08-11: qty 150

## 2022-08-11 MED ORDER — DOXORUBICIN HCL CHEMO IV INJECTION 2 MG/ML
50.0000 mg/m2 | Freq: Once | INTRAVENOUS | Status: AC
  Administered 2022-08-11: 100 mg via INTRAVENOUS
  Filled 2022-08-11: qty 50

## 2022-08-11 MED ORDER — DIPHENHYDRAMINE HCL 25 MG PO CAPS
50.0000 mg | ORAL_CAPSULE | Freq: Once | ORAL | Status: AC
  Administered 2022-08-11: 50 mg via ORAL
  Filled 2022-08-11: qty 2

## 2022-08-11 MED ORDER — SODIUM CHLORIDE 0.9 % IV SOLN
750.0000 mg/m2 | Freq: Once | INTRAVENOUS | Status: AC
  Administered 2022-08-11: 1500 mg via INTRAVENOUS
  Filled 2022-08-11: qty 50

## 2022-08-11 MED ORDER — PALONOSETRON HCL INJECTION 0.25 MG/5ML
0.2500 mg | Freq: Once | INTRAVENOUS | Status: AC
  Administered 2022-08-11: 0.25 mg via INTRAVENOUS
  Filled 2022-08-11: qty 5

## 2022-08-11 MED ORDER — SODIUM CHLORIDE 0.9 % IV SOLN
140.0000 mg | Freq: Once | INTRAVENOUS | Status: AC
  Administered 2022-08-11: 140 mg via INTRAVENOUS
  Filled 2022-08-11: qty 7

## 2022-08-11 MED ORDER — HEPARIN SOD (PORK) LOCK FLUSH 100 UNIT/ML IV SOLN
500.0000 [IU] | Freq: Once | INTRAVENOUS | Status: AC | PRN
  Administered 2022-08-11: 500 [IU]
  Filled 2022-08-11: qty 5

## 2022-08-11 NOTE — Patient Instructions (Signed)
[facility]  Discharge Instructions: Thank you for choosing [facility] to provide your oncology and hematology care.  If you have a lab appointment with the Cancer Center, please go directly to the Cancer Center and check in at the registration area.  Wear comfortable clothing and clothing appropriate for easy access to any Portacath or PICC line.   We strive to give you quality time with your provider. You may need to reschedule your appointment if you arrive late (15 or more minutes).  Arriving late affects you and other patients whose appointments are after yours.  Also, if you miss three or more appointments without notifying the office, you may be dismissed from the clinic at the provider's discretion.      For prescription refill requests, have your pharmacy contact our office and allow 72 hours for refills to be completed.     To help prevent nausea and vomiting after your treatment, we encourage you to take your nausea medication as directed.  BELOW ARE SYMPTOMS THAT SHOULD BE REPORTED IMMEDIATELY: *FEVER GREATER THAN 100.4 F (38 C) OR HIGHER *CHILLS OR SWEATING *NAUSEA AND VOMITING THAT IS NOT CONTROLLED WITH YOUR NAUSEA MEDICATION *UNUSUAL SHORTNESS OF BREATH *UNUSUAL BRUISING OR BLEEDING *URINARY PROBLEMS (pain or burning when urinating, or frequent urination) *BOWEL PROBLEMS (unusual diarrhea, constipation, pain near the anus) TENDERNESS IN MOUTH AND THROAT WITH OR WITHOUT PRESENCE OF ULCERS (sore throat, sores in mouth, or a toothache) UNUSUAL RASH, SWELLING OR PAIN  UNUSUAL VAGINAL DISCHARGE OR ITCHING   Items with * indicate a potential emergency and should be followed up as soon as possible or go to the Emergency Department if any problems should occur.  Please show the CHEMOTHERAPY ALERT CARD or IMMUNOTHERAPY ALERT CARD at check-in to the Emergency Department and triage nurse.  Should you have questions after your visit  or need to cancel or reschedule your appointment, please contact [facility]  [phone] and follow the prompts.  Office hours are 8:00 a.m. to 4:30 p.m. Monday - Friday. Please note that voicemails left after 4:00 p.m. may not be returned until the following business day.  We are closed weekends and major holidays. You have access to a nurse at all times for urgent questions. Please call the main number to the clinic [phone] and follow the prompts.  For any non-urgent questions, you may also contact your provider using MyChart. We now offer e-Visits for anyone 18 and older to request care online for non-urgent symptoms. For details visit [email or web address].   Also download the MyChart app! Go to the app store, search "MyChart", open the app, select [facility], and log in with your MyChart username and password.    

## 2022-08-12 MED ORDER — SODIUM CHLORIDE (PF) 0.9 % IJ SOLN
Freq: Once | INTRAMUSCULAR | Status: AC
  Filled 2022-08-12: qty 0.48

## 2022-08-12 NOTE — Progress Notes (Signed)
Patient for DG Methotrexate Inj on Thurs 08/13/2022, I called and spoke with the patient on the phone and gave pre-procedure instructions. Pt was made aware to be here at [facility] at the new entrance. Pt stated understanding.  Called 08/12/2022

## 2022-08-13 ENCOUNTER — Ambulatory Visit
Admission: RE | Admit: 2022-08-13 | Discharge: 2022-08-13 | Disposition: A | Discharge status: Home / Self Care | Payer: 59 | Source: Ambulatory Visit | Attending: Oncology | Admitting: Oncology

## 2022-08-13 ENCOUNTER — Other Ambulatory Visit: Payer: Self-pay

## 2022-08-13 ENCOUNTER — Inpatient Hospital Stay: Payer: 59

## 2022-08-13 VITALS — BP 122/86 | HR 100 | Temp 97.8°F | Resp 12

## 2022-08-13 DIAGNOSIS — C8339 Diffuse large B-cell lymphoma, extranodal and solid organ sites: Secondary | ICD-10-CM | POA: Diagnosis not present

## 2022-08-13 DIAGNOSIS — C8338 Diffuse large B-cell lymphoma, lymph nodes of multiple sites: Secondary | ICD-10-CM | POA: Diagnosis not present

## 2022-08-13 LAB — GLUCOSE, CAPILLARY: Glucose-Capillary: 186 mg/dL — ABNORMAL HIGH (ref 70–99)

## 2022-08-13 MED ORDER — LIDOCAINE HCL (PF) 1 % IJ SOLN
10.0000 mL | Freq: Once | INTRAMUSCULAR | Status: AC
  Administered 2022-08-13: 5 mL
  Filled 2022-08-13: qty 10

## 2022-08-13 MED ORDER — PEGFILGRASTIM INJECTION 6 MG/0.6ML ~~LOC~~
6.0000 mg | PREFILLED_SYRINGE | Freq: Once | SUBCUTANEOUS | Status: AC
  Administered 2022-08-13: 6 mg via SUBCUTANEOUS
  Filled 2022-08-13: qty 0.6

## 2022-08-13 NOTE — Progress Notes (Signed)
1145 - patient taken via wheelchair to cancer center for continued treatment.

## 2022-08-13 NOTE — Procedures (Signed)
PROCEDURE SUMMARY:  Successful fluoroscopic guided LP with successful methotrexate injection. 5 mL of colorless CSF removed prior to therapeutic injection.  No immediate complications.  Pt tolerated well.    Specimen was not sent for labs.   EBL < 18m  MRockney Ghee2/22/2024 9:36 AM

## 2022-08-17 ENCOUNTER — Telehealth: Payer: Self-pay | Admitting: *Deleted

## 2022-08-17 NOTE — Telephone Encounter (Signed)
Dr. Tasia Catchings recommends that patient waits one more week before staring to eat these foods again. Pt informed and verbalized understanding.

## 2022-08-17 NOTE — Telephone Encounter (Signed)
Patient called reporting that he completed chemotherapy last week and wants to know when he can eat shrimp and lettuce again.

## 2022-08-18 ENCOUNTER — Encounter: Payer: Self-pay | Admitting: Oncology

## 2022-09-07 ENCOUNTER — Inpatient Hospital Stay: Payer: 59 | Attending: Oncology

## 2022-09-07 DIAGNOSIS — Z452 Encounter for adjustment and management of vascular access device: Secondary | ICD-10-CM | POA: Diagnosis present

## 2022-09-07 DIAGNOSIS — C8339 Diffuse large B-cell lymphoma, extranodal and solid organ sites: Secondary | ICD-10-CM | POA: Diagnosis present

## 2022-09-07 DIAGNOSIS — C8338 Diffuse large B-cell lymphoma, lymph nodes of multiple sites: Secondary | ICD-10-CM

## 2022-09-07 DIAGNOSIS — Z95828 Presence of other vascular implants and grafts: Secondary | ICD-10-CM

## 2022-09-07 MED ORDER — SODIUM CHLORIDE 0.9% FLUSH
10.0000 mL | Freq: Once | INTRAVENOUS | Status: AC
  Administered 2022-09-07: 10 mL via INTRAVENOUS
  Filled 2022-09-07: qty 10

## 2022-09-07 MED ORDER — HEPARIN SOD (PORK) LOCK FLUSH 100 UNIT/ML IV SOLN
500.0000 [IU] | Freq: Once | INTRAVENOUS | Status: AC
  Administered 2022-09-07: 500 [IU] via INTRAVENOUS
  Filled 2022-09-07: qty 5

## 2022-09-11 MED ORDER — PHENYLEPHRINE HCL-NACL 20-0.9 MG/250ML-% IV SOLN
INTRAVENOUS | Status: AC
  Filled 2022-09-11: qty 250

## 2022-09-11 MED ORDER — VASOPRESSIN 20 UNIT/ML IV SOLN
INTRAVENOUS | Status: AC
  Filled 2022-09-11: qty 1

## 2022-10-01 ENCOUNTER — Telehealth: Payer: Self-pay

## 2022-10-01 ENCOUNTER — Telehealth: Payer: Self-pay | Admitting: Oncology

## 2022-10-01 NOTE — Telephone Encounter (Signed)
Called pt to notify of cancellation of PET, PER [employer] auth is still pending lvm

## 2022-10-01 NOTE — Telephone Encounter (Signed)
Per Roxan Diesel: case is still pending, pls cancel PET.   Please cancel PET and inform pt of cancellation  Pt scheduled for  PF w lab/ MD on 4/22, if needed, appts will need to be moved out to be 2- 3 days afte PET.

## 2022-10-02 NOTE — Telephone Encounter (Signed)
Spoke to pt and informed him that PET scan has been cancelled due to insurance. Will get scan r/s once authorized. Pt verbalized understanding.

## 2022-10-05 ENCOUNTER — Ambulatory Visit: Payer: 59

## 2022-10-05 ENCOUNTER — Encounter: Payer: Self-pay | Admitting: Oncology

## 2022-10-05 NOTE — Telephone Encounter (Signed)
Pt's appts have been r/s

## 2022-10-06 ENCOUNTER — Other Ambulatory Visit: Payer: Self-pay

## 2022-10-12 ENCOUNTER — Other Ambulatory Visit: Payer: 59

## 2022-10-12 ENCOUNTER — Ambulatory Visit: Payer: 59 | Admitting: Oncology

## 2022-10-13 ENCOUNTER — Ambulatory Visit
Admission: RE | Admit: 2022-10-13 | Discharge: 2022-10-13 | Disposition: A | Discharge status: Home / Self Care | Payer: 59 | Source: Ambulatory Visit | Attending: Oncology | Admitting: Oncology

## 2022-10-13 DIAGNOSIS — C8338 Diffuse large B-cell lymphoma, lymph nodes of multiple sites: Secondary | ICD-10-CM | POA: Insufficient documentation

## 2022-10-13 DIAGNOSIS — I700 Atherosclerosis of aorta: Secondary | ICD-10-CM | POA: Insufficient documentation

## 2022-10-13 DIAGNOSIS — C8580 Other specified types of non-Hodgkin lymphoma, unspecified site: Secondary | ICD-10-CM | POA: Diagnosis present

## 2022-10-13 LAB — GLUCOSE, CAPILLARY: Glucose-Capillary: 178 mg/dL — ABNORMAL HIGH (ref 70–99)

## 2022-10-13 MED ORDER — FLUDEOXYGLUCOSE F - 18 (FDG) INJECTION
10.3600 | Freq: Once | INTRAVENOUS | Status: AC | PRN
  Administered 2022-10-13: 10.36 via INTRAVENOUS

## 2022-10-14 ENCOUNTER — Ambulatory Visit: Payer: 59 | Admitting: Oncology

## 2022-10-14 ENCOUNTER — Other Ambulatory Visit: Payer: 59

## 2022-10-15 ENCOUNTER — Inpatient Hospital Stay: Payer: 59 | Attending: Oncology

## 2022-10-15 ENCOUNTER — Inpatient Hospital Stay (HOSPITAL_BASED_OUTPATIENT_CLINIC_OR_DEPARTMENT_OTHER): Payer: 59 | Admitting: Oncology

## 2022-10-15 ENCOUNTER — Ambulatory Visit: Payer: 59

## 2022-10-15 ENCOUNTER — Encounter: Payer: Self-pay | Admitting: Oncology

## 2022-10-15 VITALS — BP 140/92 | HR 88 | Temp 96.0°F | Resp 18 | Wt 186.3 lb

## 2022-10-15 DIAGNOSIS — C8339 Diffuse large B-cell lymphoma, extranodal and solid organ sites: Secondary | ICD-10-CM | POA: Insufficient documentation

## 2022-10-15 DIAGNOSIS — R109 Unspecified abdominal pain: Secondary | ICD-10-CM | POA: Diagnosis not present

## 2022-10-15 DIAGNOSIS — C8338 Diffuse large B-cell lymphoma, lymph nodes of multiple sites: Secondary | ICD-10-CM

## 2022-10-15 DIAGNOSIS — Z809 Family history of malignant neoplasm, unspecified: Secondary | ICD-10-CM | POA: Insufficient documentation

## 2022-10-15 DIAGNOSIS — I252 Old myocardial infarction: Secondary | ICD-10-CM | POA: Diagnosis not present

## 2022-10-15 DIAGNOSIS — R634 Abnormal weight loss: Secondary | ICD-10-CM | POA: Diagnosis not present

## 2022-10-15 DIAGNOSIS — K5909 Other constipation: Secondary | ICD-10-CM

## 2022-10-15 LAB — CMP (CANCER CENTER ONLY)
ALT: 24 U/L (ref 0–44)
AST: 30 U/L (ref 15–41)
Albumin: 4.4 g/dL (ref 3.5–5.0)
Alkaline Phosphatase: 87 U/L (ref 38–126)
Anion gap: 9 (ref 5–15)
BUN: 22 mg/dL (ref 8–23)
CO2: 22 mmol/L (ref 22–32)
Calcium: 9.3 mg/dL (ref 8.9–10.3)
Chloride: 103 mmol/L (ref 98–111)
Creatinine: 0.7 mg/dL (ref 0.61–1.24)
GFR, Estimated: >60 mL/min (ref >60)
Glucose, Bld: 170 mg/dL — ABNORMAL HIGH (ref 70–99)
Potassium: 3.8 mmol/L (ref 3.5–5.1)
Sodium: 134 mmol/L — ABNORMAL LOW (ref 135–145)
Total Bilirubin: 0.7 mg/dL (ref 0.3–1.2)
Total Protein: 7.1 g/dL (ref 6.5–8.1)

## 2022-10-15 LAB — CBC WITH DIFFERENTIAL (CANCER CENTER ONLY)
Abs Immature Granulocytes: 0.03 10*3/uL (ref 0.00–0.07)
Basophils Absolute: 0 10*3/uL (ref 0.0–0.1)
Basophils Relative: 1 %
Eosinophils Absolute: 0.1 10*3/uL (ref 0.0–0.5)
Eosinophils Relative: 3 %
HCT: 40.2 % (ref 39.0–52.0)
Hemoglobin: 14.1 g/dL (ref 13.0–17.0)
Immature Granulocytes: 1 %
Lymphocytes Relative: 15 %
Lymphs Abs: 0.8 10*3/uL (ref 0.7–4.0)
MCH: 31.1 pg (ref 26.0–34.0)
MCHC: 35.1 g/dL (ref 30.0–36.0)
MCV: 88.5 fL (ref 80.0–100.0)
Monocytes Absolute: 0.8 10*3/uL (ref 0.1–1.0)
Monocytes Relative: 16 %
Neutro Abs: 3.4 10*3/uL (ref 1.7–7.7)
Neutrophils Relative %: 64 %
Platelet Count: 152 10*3/uL (ref 150–400)
RBC: 4.54 MIL/uL (ref 4.22–5.81)
RDW: 12.5 % (ref 11.5–15.5)
WBC Count: 5.2 10*3/uL (ref 4.0–10.5)
nRBC: 0 % (ref 0.0–0.2)

## 2022-10-15 LAB — LACTATE DEHYDROGENASE: LDH: 130 U/L (ref 98–192)

## 2022-10-15 MED ORDER — HEPARIN SOD (PORK) LOCK FLUSH 100 UNIT/ML IV SOLN
500.0000 [IU] | Freq: Once | INTRAVENOUS | Status: AC
  Administered 2022-10-15: 500 [IU] via INTRAVENOUS
  Filled 2022-10-15: qty 5

## 2022-10-15 MED ORDER — SODIUM CHLORIDE 0.9% FLUSH
10.0000 mL | Freq: Once | INTRAVENOUS | Status: AC
  Administered 2022-10-15: 10 mL via INTRAVENOUS
  Filled 2022-10-15: qty 10

## 2022-10-15 NOTE — Progress Notes (Signed)
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  PET scan was reviewed and discussed with patient.- liver, spleen, possible  thyroid,  involvements.  Marrow is negative on PET.  bone marrow biopsy is negative.   IPI score is 4. CNS IPI - 4 S/p 6 cycle of R-Pola -CHP with GCSF - 5 cycles of IT MTX/hydrocortisone.  PET restaging 8 weeks after chemo showed CR, Deuville 1 Live lesion size has decreased.  Recommend surveillance. Repeat CT in 3 months.   Constipation Continue bowel regimen.   Orders Placed This Encounter  Procedures   CT CHEST ABDOMEN PELVIS W CONTRAST    Standing Status:   Future    Standing Expiration Date:   10/15/2023    Scheduling Instructions:     To be scheduled a few days prior to seeing Dr. Cathie Hoops.    Order Specific Question:   If indicated for the ordered procedure, I authorize the administration of contrast media per Radiology protocol    Answer:   Yes    Order Specific Question:   Does the patient have a contrast media/X-ray dye allergy?    Answer:   No    Order Specific Question:   Preferred imaging location?    Answer:   [facility]    Order Specific Question:   If indicated for the ordered procedure, I authorize the administration of oral contrast media per Radiology protocol    Answer:   Yes   CBC with Differential (Cancer Center Only)    Standing Status:   Future    Standing Expiration Date:   10/15/2023   CMP (Cancer Center only)    Standing Status:   Future    Standing Expiration Date:    10/15/2023   Lactate dehydrogenase    Standing Status:   Future    Standing Expiration Date:   10/15/2023    Follow up  3 months.   All questions were answered. The patient knows to call the clinic with any problems, questions or concerns.  Rickard Patience, MD, PhD [facility] Hematology Oncology 10/15/2022     HISTORY OF PRESENTING ILLNESS:   03/22/2022, patient presented to emergency room for evaluation of right side lower chest/upper abdomen/flank pain, intermittent low-grade fever and chills, night sweats and unintentional weight loss of 20 to 30 pounds over the past 6 months.  He finished a course of antibiotics a few months ago for unknown infection and symptom has not improved.  Oncology History  Diffuse large B cell lymphoma  03/22/2022 Imaging   CT chest angiogram, CT abdomen pelvis w contrast  1. No acute pulmonary embolism. 2. There are multiple heterogeneously enhancing masses throughout the liver, several which demonstrate capsular retraction. No definitive extrahepatic primary is identified. Findings could reflect multifocal hepatocellular carcinoma/cholangiocarcinoma, lymphoma or abscess. Recommend correlation with blood chemistry values. Liver MRI with and without contrast could be of assistance in further characterizing these masses. 3. Splenomegaly.4. Multiple borderline enlarged lymph nodes above and below the diaphragm. 5. Homogeneously enlarged thyroid gland, nonspecific. Consider further dedicated evaluation with nonemergent thyroid  ultrasound. 6. Mild bronchial wall thickening with bibasilar atelectasis. This could reflect a nonspecific bronchitis.   03/22/2022 Imaging   MRI liver w wo contrast 1. Bilobar hepatic lesions demonstrate irregular contour with minimally increased T2 signal, hypointense intrinsic T1 signal, postcontrast enhancement that is slowly increases and is most prominent in intensities 5 minutes postcontrast administration as well as a peripheral rim of  reduced diffusivity.  2. Enlarged upper abdominal lymph nodes, likely reflect metastatic nodal disease involvement. 3. Enhancing lesions in the right tenth rib and L2 vertebral body,concerning for osseous metastatic disease. 4. Prominent subcentimeter pericardiophrenic and internal mammary lymph nodes also suspicious for nodal metastatic disease.    03/31/2022 Cancer Staging   Staging form: Hodgkin and Non-Hodgkin Lymphoma, AJCC 8th Edition - Clinical stage from 03/31/2022: Stage IV (Diffuse large B-cell lymphoma) - Signed by Rickard Patience, MD on 04/19/2022 Stage prefix: Initial diagnosis International Prognostic Index (IPI) score: Score 4 International Prognostic Index risk group: High risk DLBCL cell of origin (COO): Activated B-cell   04/03/2022 Initial Diagnosis   Large cell lymphoma (HCC)  04/03/2022, ultrasound-guided liver biopsy showed large B-cell lymphoma.  Additional IHC is pending for subclassification as well as ancillary FISH testing for rearrangementds   04/16/2022 Imaging   PET initial staging showed 1. Multiple bulky intensely hypermetabolic liver masses throughout both liver lobes, compatible with reported lymphoma. Deauville score 5. 2. Mild splenomegaly with mild splenic hypermetabolism, compatible with lymphoma. No enlarged or hypermetabolic lymph nodes. No hypermetabolic skeletal activity. 3. 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 4. Chronic findings include: Aortic Atherosclerosis.Mild sigmoid diverticulosis.     04/20/2022 Procedure   Medi port placed by IR   04/22/2022 -  Chemotherapy   Patient is on Treatment Plan : NON-HODGKINS LYMPHOMA R-POLA CHP q21d     06/05/2022 Imaging   Restaging PET scan showed -PET-CT findings consistent with a complete metabolic response. No residual hypermetabolism involving the liver (Deauville 1). All measurable liver lesions are much smaller.   Mild residual FDG  uptake in the thyroid gland. This could be due to treated thyroiditis. Treated thyroid gland involvement with lymphoma is also possible (Deauville 2).    10/13/2022 Imaging   PET scan 8 weeks after chemotherapy  showed  1. No signs of residual or recurrent FDG avid tumor within the neck,chest, abdomen or pelvis. (Deauville criteria 1). 2. Continued decrease in size of treated lesions within the liver. 3. Similar appearance of mild diffuse increased uptake within both lobes of thyroid gland. Consider correlation with thyroid function tests. 4.  Aortic Atherosclerosis     INTERVAL HISTORY Joe Adams is a 62 y.o. male who has above history reviewed by me today presents for follow up visit for large B-cell lymphoma.   Status post 6 cycles  R- Pola CHP, + Intrathecal MTX Today he reports feeling well.  + right side pain, intermittent, sometimes triggered after heavy lifting. He reports being constipation "once a week".   MEDICAL HISTORY:  Past Medical History:  Diagnosis Date   Acute MI 2005   Diabetes mellitus without complication    GERD (gastroesophageal reflux disease)    Headache    Hernia, umbilical    History of kidney stones    Large cell lymphoma 04/03/2022   Thyroid disease     SURGICAL HISTORY: Past Surgical History:  Procedure Laterality Date   COLONOSCOPY N/A 11/18/2016   Procedure: COLONOSCOPY IN O.R;  Surgeon: Kieth Brightly, MD;  Location: [facility] ORS;  Service: General;  Laterality: N/A;   COLONOSCOPY WITH PROPOFOL N/A 11/18/2016   Procedure: COLONOSCOPY WITH PROPOFOL;  Surgeon: Kieth Brightly, MD;  Location: [facility] ENDOSCOPY;  Service: Endoscopy;  Laterality: N/A;   HEMORRHOID SURGERY N/A 11/18/2016   Procedure: HEMORRHOIDECTOMY;  Surgeon: Kieth Brightly, MD;  Location: [facility] ORS;  Service: General;  Laterality: N/A;   IR IMAGING GUIDED PORT INSERTION  04/20/2022   TONSILLECTOMY     as a child    SOCIAL HISTORY: Social History    Socioeconomic History   Marital status: Legally Separated    Spouse name: Not on file   Number of children: Not on file   Years of education: Not on file   Highest education level: Not on file  Occupational History   Not on file  Tobacco Use   Smoking status: Never   Smokeless tobacco: Never  Vaping Use   Vaping Use: Never used  Substance and Sexual Activity   Alcohol use: Not Currently    Alcohol/week: 1.0 standard drink of alcohol    Types: 1 Shots of liquor per week    Comment: occ   Drug use: No   Sexual activity: Not on file  Other Topics Concern   Not on file  Social History Narrative   Not on file   Social Determinants of Health   Financial Resource Strain: Low Risk  (04/15/2022)   Overall Financial Resource Strain (CARDIA)    Difficulty of Paying Living Expenses: Not very hard  Recent Concern: Financial Resource Strain - Medium Risk (04/15/2022)   Overall Financial Resource Strain (CARDIA)    Difficulty of Paying Living Expenses: Somewhat hard  Food Insecurity: No Food Insecurity (04/15/2022)   Hunger Vital Sign    Worried About Running Out of Food in the Last Year: Never true    Ran Out of Food in the Last Year: Never true  Transportation Needs: No Transportation Needs (06/16/2022)   PRAPARE - Administrator, Civil Service (Medical): No    Lack of Transportation (Non-Medical): No  Physical Activity: Inactive (04/15/2022)   Exercise Vital Sign    Days of Exercise per Week: 0 days    Minutes of Exercise per Session: 0 min  Stress: Stress Concern Present (04/15/2022)   [employer] of Occupational Health - Occupational Stress Questionnaire    Feeling of Stress : To some extent  Social Connections: Moderately Integrated (04/15/2022)   Social Connection and Isolation Panel [NHANES]    Frequency of Communication with Friends and Family: Three times a week    Frequency of Social Gatherings with Friends and Family: Twice a week    Attends  Religious Services: 1 to 4 times per year    Active Member of [employer] or Organizations: No    Attends Banker Meetings: Never    Marital Status: Living with partner  Intimate Partner Violence: Not At Risk (04/15/2022)   Humiliation, Afraid, Rape, and Kick questionnaire    Fear of Current or Ex-Partner: No    Emotionally Abused: No    Physically Abused: No    Sexually Abused: No    FAMILY HISTORY: Family History  Problem Relation Age of Onset   Cancer Other    Cancer Paternal Uncle     ALLERGIES:  has No Known Allergies.  MEDICATIONS:  Current Outpatient Medications  Medication Sig Dispense Refill   Acetaminophen (TYLENOL EXTRA STRENGTH PO) Take 1 tablet by mouth every 6 (six) hours as needed (constipation).  Docusate Calcium (STOOL SOFTENER PO) Take by mouth.     Glucose Blood (TRUE METRIX BLOOD GLUCOSE TEST VI) as directed.     LANTUS SOLOSTAR 100 UNIT/ML Solostar Pen Inject into the skin. Pt is on a sliding scale; 20 units if blood glucose is at 200; takes 25 units if blood glucose on is 225     levothyroxine (SYNTHROID) 150 MCG tablet Take 150 mcg by mouth daily.     lidocaine-prilocaine (EMLA) cream Apply to affected area once 30 g 3   metFORMIN (GLUCOPHAGE) 1000 MG tablet 2 (two) times daily.     TRUEPLUS 5-BEVEL PEN NEEDLES 31G X 6 MM MISC      acyclovir (ZOVIRAX) 400 MG tablet Take 1 tablet (400 mg total) by mouth daily. (Patient not taking: Reported on 10/15/2022) 30 tablet 3   allopurinol (ZYLOPRIM) 100 MG tablet Take 1 tablet (100 mg total) by mouth daily. (Patient not taking: Reported on 10/15/2022) 30 tablet 0   HYDROcodone-acetaminophen (NORCO/VICODIN) 5-325 MG tablet Take 1 tablet by mouth every 6 (six) hours as needed. (Patient not taking: Reported on 06/16/2022)     lactulose (CHRONULAC) 10 GM/15ML solution Take 15 mLs (10 g total) by mouth daily as needed for mild constipation. (Patient not taking: Reported on 07/20/2022) 236 mL 0   loratadine  (CLARITIN) 10 MG tablet Take 10 mg by mouth daily. (Patient not taking: Reported on 10/15/2022)     ondansetron (ZOFRAN) 8 MG tablet Take 1 tablet (8 mg total) by mouth every 8 (eight) hours as needed for nausea or vomiting. Start on the third day after cyclophosphamide chemotherapy. (Patient not taking: Reported on 08/10/2022) 30 tablet 1   oxyCODONE-acetaminophen (PERCOCET) 5-325 MG tablet Take 1 tablet by mouth every 6 (six) hours as needed for severe pain. (Patient not taking: Reported on 07/20/2022) 60 tablet 0   prochlorperazine (COMPAZINE) 10 MG tablet Take 1 tablet (10 mg total) by mouth every 6 (six) hours as needed for nausea or vomiting. (Patient not taking: Reported on 06/16/2022) 30 tablet 6   No current facility-administered medications for this visit.    Review of Systems  Constitutional:  Negative for appetite change, chills, fatigue, fever and unexpected weight change.  HENT:   Negative for voice change.   Eyes:  Negative for eye problems and icterus.  Respiratory:  Negative for chest tightness, cough and shortness of breath.   Cardiovascular:  Negative for chest pain and leg swelling.  Gastrointestinal:  Negative for abdominal distention and abdominal pain.  Endocrine: Negative for hot flashes.  Genitourinary:  Negative for difficulty urinating, dysuria and frequency.   Musculoskeletal:  Negative for arthralgias.  Skin:  Negative for itching and rash.  Neurological:  Negative for light-headedness and numbness.  Hematological:  Negative for adenopathy. Does not bruise/bleed easily.  Psychiatric/Behavioral:  Negative for confusion.    PHYSICAL EXAMINATION: ECOG PERFORMANCE STATUS: 1 - Symptomatic but completely ambulatory Vitals:   10/15/22 0844  BP: (!) 140/92  Pulse: 88  Resp: 18  Temp: (!) 96 F (35.6 C)  SpO2: 98%   Filed Weights   10/15/22 0844  Weight: 186 lb 4.8 oz (84.5 kg)    Physical Exam Constitutional:      General: He is not in acute distress. HENT:      Head: Normocephalic and atraumatic.  Eyes:     General: No scleral icterus. Cardiovascular:     Rate and Rhythm: Normal rate.  Pulmonary:     Effort: Pulmonary effort is normal. No  respiratory distress.  Abdominal:     General: There is no distension.  Musculoskeletal:        General: Normal range of motion.     Cervical back: Normal range of motion and neck supple.  Skin:    Findings: No rash.  Neurological:     Mental Status: He is alert and oriented to person, place, and time. Mental status is at baseline.     Cranial Nerves: No cranial nerve deficit.  Psychiatric:        Mood and Affect: Mood normal.     LABORATORY DATA:  I have reviewed the data as listed    Latest Ref Rng & Units 10/15/2022    8:37 AM 08/10/2022    8:22 AM 07/20/2022    8:20 AM  CBC  WBC 4.0 - 10.5 K/uL 5.2  7.6  7.3   Hemoglobin 13.0 - 17.0 g/dL 16.1  09.6  04.5   Hematocrit 39.0 - 52.0 % 40.2  35.8  36.1   Platelets 150 - 400 K/uL 152  212  224       Latest Ref Rng & Units 10/15/2022    8:37 AM 08/10/2022    8:22 AM 07/20/2022    8:20 AM  CMP  Glucose 70 - 99 mg/dL 409  811  914   BUN 8 - 23 mg/dL 22  27  21    Creatinine 0.61 - 1.24 mg/dL 7.82  9.56  2.13   Sodium 135 - 145 mmol/L 134  132  135   Potassium 3.5 - 5.1 mmol/L 3.8  4.3  4.1   Chloride 98 - 111 mmol/L 103  101  103   CO2 22 - 32 mmol/L 22  22  24    Calcium 8.9 - 10.3 mg/dL 9.3  9.0  9.3   Total Protein 6.5 - 8.1 g/dL 7.1  7.0  6.9   Total Bilirubin 0.3 - 1.2 mg/dL 0.7  0.5  0.6   Alkaline Phos 38 - 126 U/L 87  123  87   AST 15 - 41 U/L 30  21  20    ALT 0 - 44 U/L 24  27  15        RADIOGRAPHIC STUDIES: I have personally reviewed the radiological images as listed and agreed with the findings in the report. NM PET Image Restag (PS) Skull Base To Thigh  Result Date: 10/15/2022 CLINICAL DATA:  Subsequent treatment strategy for lymphoma. EXAM: NUCLEAR MEDICINE PET SKULL BASE TO THIGH TECHNIQUE: 10.36 mCi F-18 FDG was injected  intravenously. Full-ring PET imaging was performed from the skull base to thigh after the radiotracer. CT data was obtained and used for attenuation correction and anatomic localization. Fasting blood glucose: 178 mg/dl COMPARISON:  08/65/7846 FINDINGS: Mediastinal blood pool activity: SUV max 2.75 Liver activity: SUV max 3.29 NECK: No hypermetabolic lymph nodes in the neck. Incidental CT findings: Similar mild diffuse increased uptake within both lobes of thyroid gland. CHEST: No hypermetabolic mediastinal or hilar nodes. No suspicious pulmonary nodules on the CT scan. Incidental CT findings: Aortic atherosclerosis. ABDOMEN/PELVIS: No abnormal hypermetabolic activity within the liver, pancreas, adrenal glands, or spleen. No hypermetabolic lymph nodes in the abdomen or pelvis. Several areas of low attenuation within the liver are again noted corresponding to areas of treated tumor. No significant tracer uptake identified within these areas above background liver activity. Index lesion in segment 7 measures 2 cm, image 75/4. Previously 2.6 cm. Index lesion within segment 8/4 measures 5.7 x 2.7 cm, image  79/4. Previously 5.9 x 3.4 cm. Index lesion within segment 5 measures 4.2 x 2.7 cm, image 85/4. Previously 5.5 x 3.8 cm. Incidental CT findings: Spleen is stable in size measuring 15.4 cm. No focal increased radiotracer uptake identified. SKELETON: No focal hypermetabolic activity to suggest skeletal metastasis. Incidental CT findings: None. IMPRESSION: 1. No signs of residual or recurrent FDG avid tumor within the neck, chest, abdomen or pelvis. (Deauville criteria 1). 2. Continued decrease in size of treated lesions within the liver. 3. Similar appearance of mild diffuse increased uptake within both lobes of thyroid gland. Consider correlation with thyroid function tests. 4.  Aortic Atherosclerosis (ICD10-I70.0). Electronically Signed   By: Signa Kell M.D.   On: 10/15/2022 08:37   DG FLUORO GUIDED LOC OF  NEEDLE/CATH TIP FOR SPINAL INJECT LT  Result Date: 08/13/2022 INDICATION: Patient with diffuse large B-cell lymphoma with request received for intrathecal methotrexate injection. Patient has previously tolerated this procedure and medication. EXAM: FLUOROSCOPICALLY GUIDED LUMBAR PUNCTURE FOR INTRATHECAL CHEMOTHERAPY COMPARISON:  July 23, 2022 FLUOROSCOPY TIME:  Radiation Exposure Index (if provided by the fluoroscopic device): 1.20 mGy PROCEDURE: Informed consent was obtained from the patient prior to the procedure, including potential complications of reactions to the medication, bleeding, infection, paresthesias, nerve damage, CSF leak requiring additional procedures, post procedure requirement to lay flat for several hours after the procedure, headache, allergy, and pain. With the patient prone, the lower back was prepped with Betadine. 1% lidocaine was used for local anesthesia. Lumbar puncture was performed at the L4-L5 level using a 20 gauge needle with return of colorless CSF with an opening pressure of 18 cm water. 5 ml of CSF was removed prior to the injection. A pre-filled 5 mL sterile syringe from the pharmacy containing 12 mg of methotrexate with 50 mg of hydrocortisone sodium succinate was injected into the subarachnoid space. The inner stylet was placed back into the needle and the needle was removed. The patient tolerated the procedure well and there were no apparent complications. A sterile bandage was applied. COMPLICATIONS: None. IMPRESSION: Successful fluoroscopic guided lumbar puncture and intrathecal injection of chemotherapy, without complication. This exam was performed by Pattricia Boss PA-C, and was supervised and interpreted by Dr. Charise Killian. Electronically Signed   By: Obie Dredge M.D.   On: 08/13/2022 12:14   DG FLUORO GUIDED LOC OF NEEDLE/CATH TIP FOR SPINAL INJECT LT  Result Date: 07/23/2022 INDICATION: Provided history: Diffuse large B-cell lymphoma of lymph nodes of multiple  regions. EXAM: FLUOROSCOPICALLY GUIDED LUMBAR PUNCTURE FOR INTRATHECAL CHEMOTHERAPY COMPARISON:  Fluoroscopic images from prior lumbar puncture 07/02/2022. FLUOROSCOPY TIME:  12 seconds (4.70 mGy). PROCEDURE: Pattricia Boss, PA-C obtained informed consent from the patient prior to the procedure. This process included a discussion of procedural risks. The patient was positioned prone on the fluoroscopy table. An appropriate skin entry site was determined under fluoroscopy and marked. The operator donned sterile gloves and a face mask. The lower back was prepped with Betadine and draped in the usual sterile fashion. 1% lidocaine was used for local anesthesia. Under fluoroscopic guidance, lumbar puncture was performed at the L4-L5 level using a 20 gauge spinal needle with return of clear/colorless CSF and opening pressure of 17 cm water. 5 mL of CSF were removed. Subsequently, a pre-filled 5 mL sterile syringe from the pharmacy containing 12 mg of methotrexate with 50 mg of hydrocortisone sodium succinate was injected into the thecal sac. The inner stylet was replaced within the needle and the needle was removed in its entirety.  A sterile dressing was applied to the skin entry site. The patient tolerated the procedure well, and no immediate post-procedure complication was apparent. The procedure was performed by Pattricia Boss, PA-C, and was supervised and interpreted by Dr. Jackey Loge. COMPLICATIONS: None. IMPRESSION: Technically successful fluoroscopically-guided L4-L5 lumbar puncture with intrathecal injection of chemotherapy, without complication. Electronically Signed   By: Jackey Loge D.O.   On: 07/23/2022 10:21

## 2022-10-15 NOTE — Assessment & Plan Note (Signed)
Continue bowel regimen. ?

## 2022-10-15 NOTE — Assessment & Plan Note (Addendum)
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  PET scan was reviewed and discussed with patient.- liver, spleen, possible  thyroid,  involvements.  Marrow is negative on PET.  bone marrow biopsy is negative.   IPI score is 4. CNS IPI - 4 S/p 6 cycle of R-Pola -CHP with GCSF - 5 cycles of IT MTX/hydrocortisone.  PET restaging 8 weeks after chemo showed CR, Deuville 1 Live lesion size has decreased.  Recommend surveillance. Repeat CT in 3 months.

## 2022-10-16 ENCOUNTER — Other Ambulatory Visit: Payer: Self-pay

## 2022-10-17 ENCOUNTER — Other Ambulatory Visit: Payer: Self-pay

## 2022-11-26 ENCOUNTER — Inpatient Hospital Stay: Payer: 59 | Attending: Oncology

## 2022-11-26 DIAGNOSIS — Z452 Encounter for adjustment and management of vascular access device: Secondary | ICD-10-CM | POA: Diagnosis present

## 2022-11-26 DIAGNOSIS — Z95828 Presence of other vascular implants and grafts: Secondary | ICD-10-CM

## 2022-11-26 DIAGNOSIS — C8339 Diffuse large B-cell lymphoma, extranodal and solid organ sites: Secondary | ICD-10-CM | POA: Diagnosis present

## 2022-11-26 MED ORDER — SODIUM CHLORIDE 0.9% FLUSH
10.0000 mL | INTRAVENOUS | Status: DC | PRN
  Administered 2022-11-26: 10 mL via INTRAVENOUS
  Filled 2022-11-26: qty 10

## 2022-11-26 MED ORDER — HEPARIN SOD (PORK) LOCK FLUSH 100 UNIT/ML IV SOLN
500.0000 [IU] | Freq: Once | INTRAVENOUS | Status: AC
  Administered 2022-11-26: 500 [IU] via INTRAVENOUS
  Filled 2022-11-26: qty 5

## 2022-11-26 NOTE — Patient Instructions (Signed)

## 2023-01-14 ENCOUNTER — Ambulatory Visit
Admission: RE | Admit: 2023-01-14 | Discharge: 2023-01-14 | Disposition: A | Discharge status: Home / Self Care | Payer: 59 | Source: Ambulatory Visit | Attending: Oncology | Admitting: Oncology

## 2023-01-14 DIAGNOSIS — C8338 Diffuse large B-cell lymphoma, lymph nodes of multiple sites: Secondary | ICD-10-CM | POA: Insufficient documentation

## 2023-01-14 LAB — POCT I-STAT CREATININE: Creatinine, Ser: 0.9 mg/dL (ref 0.61–1.24)

## 2023-01-14 MED ORDER — IOHEXOL 300 MG/ML  SOLN
100.0000 mL | Freq: Once | INTRAMUSCULAR | Status: AC | PRN
  Administered 2023-01-14: 100 mL via INTRAVENOUS

## 2023-01-14 MED ORDER — HEPARIN SOD (PORK) LOCK FLUSH 100 UNIT/ML IV SOLN
INTRAVENOUS | Status: AC
  Filled 2023-01-14: qty 5

## 2023-01-14 MED ORDER — HEPARIN SOD (PORK) LOCK FLUSH 100 UNIT/ML IV SOLN
500.0000 [IU] | Freq: Once | INTRAVENOUS | Status: AC
  Administered 2023-01-14: 500 [IU] via INTRAVENOUS

## 2023-01-20 ENCOUNTER — Inpatient Hospital Stay: Payer: 59 | Attending: Oncology

## 2023-01-20 ENCOUNTER — Inpatient Hospital Stay (HOSPITAL_BASED_OUTPATIENT_CLINIC_OR_DEPARTMENT_OTHER): Payer: 59 | Admitting: Oncology

## 2023-01-20 ENCOUNTER — Encounter: Payer: Self-pay | Admitting: Oncology

## 2023-01-20 VITALS — BP 127/90 | HR 73 | Temp 96.9°F | Resp 18 | Wt 183.9 lb

## 2023-01-20 DIAGNOSIS — Z95828 Presence of other vascular implants and grafts: Secondary | ICD-10-CM | POA: Diagnosis not present

## 2023-01-20 DIAGNOSIS — C8338 Diffuse large B-cell lymphoma, lymph nodes of multiple sites: Secondary | ICD-10-CM | POA: Diagnosis not present

## 2023-01-20 DIAGNOSIS — C8339 Diffuse large B-cell lymphoma, extranodal and solid organ sites: Secondary | ICD-10-CM | POA: Diagnosis present

## 2023-01-20 DIAGNOSIS — Z809 Family history of malignant neoplasm, unspecified: Secondary | ICD-10-CM | POA: Insufficient documentation

## 2023-01-20 DIAGNOSIS — E871 Hypo-osmolality and hyponatremia: Secondary | ICD-10-CM | POA: Insufficient documentation

## 2023-01-20 LAB — CMP (CANCER CENTER ONLY)
ALT: 37 U/L (ref 0–44)
AST: 31 U/L (ref 15–41)
Albumin: 4.3 g/dL (ref 3.5–5.0)
Alkaline Phosphatase: 71 U/L (ref 38–126)
Anion gap: 8 (ref 5–15)
BUN: 25 mg/dL — ABNORMAL HIGH (ref 8–23)
CO2: 22 mmol/L (ref 22–32)
Calcium: 9.3 mg/dL (ref 8.9–10.3)
Chloride: 102 mmol/L (ref 98–111)
Creatinine: 0.75 mg/dL (ref 0.61–1.24)
GFR, Estimated: >60 mL/min (ref >60)
Glucose, Bld: 142 mg/dL — ABNORMAL HIGH (ref 70–99)
Potassium: 3.5 mmol/L (ref 3.5–5.1)
Sodium: 132 mmol/L — ABNORMAL LOW (ref 135–145)
Total Bilirubin: 0.7 mg/dL (ref 0.3–1.2)
Total Protein: 7.1 g/dL (ref 6.5–8.1)

## 2023-01-20 LAB — CBC WITH DIFFERENTIAL (CANCER CENTER ONLY)
Abs Immature Granulocytes: 0.03 10*3/uL (ref 0.00–0.07)
Basophils Absolute: 0.1 10*3/uL (ref 0.0–0.1)
Basophils Relative: 1 %
Eosinophils Absolute: 0.1 10*3/uL (ref 0.0–0.5)
Eosinophils Relative: 2 %
HCT: 38.5 % — ABNORMAL LOW (ref 39.0–52.0)
Hemoglobin: 13.9 g/dL (ref 13.0–17.0)
Immature Granulocytes: 1 %
Lymphocytes Relative: 15 %
Lymphs Abs: 0.8 10*3/uL (ref 0.7–4.0)
MCH: 30.8 pg (ref 26.0–34.0)
MCHC: 36.1 g/dL — ABNORMAL HIGH (ref 30.0–36.0)
MCV: 85.2 fL (ref 80.0–100.0)
Monocytes Absolute: 0.7 10*3/uL (ref 0.1–1.0)
Monocytes Relative: 12 %
Neutro Abs: 3.8 10*3/uL (ref 1.7–7.7)
Neutrophils Relative %: 69 %
Platelet Count: 147 10*3/uL — ABNORMAL LOW (ref 150–400)
RBC: 4.52 MIL/uL (ref 4.22–5.81)
RDW: 12.8 % (ref 11.5–15.5)
WBC Count: 5.5 10*3/uL (ref 4.0–10.5)
nRBC: 0 % (ref 0.0–0.2)

## 2023-01-20 LAB — LACTATE DEHYDROGENASE: LDH: 148 U/L (ref 98–192)

## 2023-01-20 MED ORDER — HEPARIN SOD (PORK) LOCK FLUSH 100 UNIT/ML IV SOLN
500.0000 [IU] | Freq: Once | INTRAVENOUS | Status: DC
  Filled 2023-01-20: qty 5

## 2023-01-20 MED ORDER — SODIUM CHLORIDE 0.9% FLUSH
10.0000 mL | Freq: Once | INTRAVENOUS | Status: DC
  Filled 2023-01-20: qty 10

## 2023-01-20 NOTE — Progress Notes (Signed)
Hematology/Oncology Progress note Telephone:([phone] Fax:([phone]      CHIEF COMPLAINTS/REASON FOR VISIT:  Stage IV DLBCL   ASSESSMENT & PLAN:   Cancer Staging  Diffuse large B cell lymphoma (HCC) Staging form: Hodgkin and Non-Hodgkin Lymphoma, AJCC 8th Edition - Clinical stage from 03/31/2022: Stage IV (Diffuse large B-cell lymphoma) - Signed by Rickard Patience, MD on 04/19/2022   Diffuse large B cell lymphoma (HCC) BCL6 cmyc IHC positive, FISH negative for BCL2, BCL6, and CMYC rearrangement.  PET scan was reviewed and discussed with patient.- liver, spleen, possible  thyroid,  involvements.  Marrow is negative on PET.  bone marrow biopsy is negative.   IPI score is 4. CNS IPI - 4 S/p 6 cycle of R-Pola -CHP with GCSF - 5 cycles of IT MTX/hydrocortisone.  PET restaging 8 weeks after chemo showed CR, Deuville 1 Follow up CT scan showed no recurrence, liver lesions decreased in size.  Recommend continue surveillance. Repeat CT in 6 months.   Hyponatremia Chronic, continue monitor  Port-A-Cath in place Recommend port flush Q6-8 weeks.   Orders Placed This Encounter  Procedures   CT CHEST ABDOMEN PELVIS W CONTRAST    Standing Status:   Future    Standing Expiration Date:   01/20/2024    Order Specific Question:   If indicated for the ordered procedure, I authorize the administration of contrast media per Radiology protocol    Answer:   Yes    Order Specific Question:   Does the patient have a contrast media/X-ray dye allergy?    Answer:   No    Order Specific Question:   Preferred imaging location?    Answer:   [facility]    Order Specific Question:   If indicated for the ordered procedure, I authorize the administration of oral contrast media per Radiology protocol    Answer:   Yes   CMP (Cancer Center only)    Standing Status:   Future    Standing Expiration Date:   01/20/2024   CBC with Differential (Cancer Center Only)    Standing Status:   Future     Standing Expiration Date:   01/20/2024   Lactate dehydrogenase    Standing Status:   Future    Standing Expiration Date:   01/20/2024    Follow up  6 months  All questions were answered. The patient knows to call the clinic with any problems, questions or concerns.  Rickard Patience, MD, PhD [facility] Hematology Oncology 01/20/2023     HISTORY OF PRESENTING ILLNESS:   03/22/2022, patient presented to emergency room for evaluation of right side lower chest/upper abdomen/flank pain, intermittent low-grade fever and chills, night sweats and unintentional weight loss of 20 to 30 pounds over the past 6 months.  He finished a course of antibiotics a few months ago for unknown infection and symptom has not improved.  Oncology History  Diffuse large B cell lymphoma (HCC)  03/22/2022 Imaging   CT chest angiogram, CT abdomen pelvis w contrast  1. No acute pulmonary embolism. 2. There are multiple heterogeneously enhancing masses throughout the liver, several which demonstrate capsular retraction. No definitive extrahepatic primary is identified. Findings could reflect multifocal hepatocellular carcinoma/cholangiocarcinoma, lymphoma or abscess. Recommend correlation with blood chemistry values. Liver MRI with and without contrast could be of assistance in further characterizing these masses. 3. Splenomegaly.4. Multiple borderline enlarged lymph nodes above and below the diaphragm. 5. Homogeneously enlarged thyroid gland, nonspecific. Consider further dedicated evaluation with nonemergent thyroid ultrasound. 6.  Mild bronchial wall thickening with bibasilar atelectasis. This could reflect a nonspecific bronchitis.   03/22/2022 Imaging   MRI liver w wo contrast 1. Bilobar hepatic lesions demonstrate irregular contour with minimally increased T2 signal, hypointense intrinsic T1 signal, postcontrast enhancement that is slowly increases and is most prominent in intensities 5 minutes postcontrast administration  as well as a peripheral rim of reduced diffusivity.  2. Enlarged upper abdominal lymph nodes, likely reflect metastatic nodal disease involvement. 3. Enhancing lesions in the right tenth rib and L2 vertebral body,concerning for osseous metastatic disease. 4. Prominent subcentimeter pericardiophrenic and internal mammary lymph nodes also suspicious for nodal metastatic disease.    03/31/2022 Cancer Staging   Staging form: Hodgkin and Non-Hodgkin Lymphoma, AJCC 8th Edition - Clinical stage from 03/31/2022: Stage IV (Diffuse large B-cell lymphoma) - Signed by Rickard Patience, MD on 04/19/2022 Stage prefix: Initial diagnosis International Prognostic Index (IPI) score: Score 4 International Prognostic Index risk group: High risk DLBCL cell of origin (COO): Activated B-cell   04/03/2022 Initial Diagnosis   Large cell lymphoma (HCC)  04/03/2022, ultrasound-guided liver biopsy showed large B-cell lymphoma.  Additional IHC is pending for subclassification as well as ancillary FISH testing for rearrangementds   04/16/2022 Imaging   PET initial staging showed 1. Multiple bulky intensely hypermetabolic liver masses throughout both liver lobes, compatible with reported lymphoma. Deauville score 5. 2. Mild splenomegaly with mild splenic hypermetabolism, compatible with lymphoma. No enlarged or hypermetabolic lymph nodes. No hypermetabolic skeletal activity. 3. 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 4. Chronic findings include: Aortic Atherosclerosis.Mild sigmoid diverticulosis.     04/20/2022 Procedure   Medi port placed by IR   04/22/2022 -  Chemotherapy   Patient is on Treatment Plan : NON-HODGKINS LYMPHOMA R-POLA CHP q21d     06/05/2022 Imaging   Restaging PET scan showed -PET-CT findings consistent with a complete metabolic response. No residual hypermetabolism involving the liver (Deauville 1). All measurable liver lesions are much  smaller.   Mild residual FDG uptake in the thyroid gland. This could be due to treated thyroiditis. Treated thyroid gland involvement with lymphoma is also possible (Deauville 2).    10/13/2022 Imaging   PET scan 8 weeks after chemotherapy  showed  1. No signs of residual or recurrent FDG avid tumor within the neck,chest, abdomen or pelvis. (Deauville criteria 1). 2. Continued decrease in size of treated lesions within the liver. 3. Similar appearance of mild diffuse increased uptake within both lobes of thyroid gland. Consider correlation with thyroid function tests. 4.  Aortic Atherosclerosis     INTERVAL HISTORY Joe Adams is a 62 y.o. male who has above history reviewed by me today presents for follow up visit for large B-cell lymphoma.   Today he reports feeling well.  Denies weight loss, fever, chills, fatigue, night sweats.  Good appetite.  MEDICAL HISTORY:  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   Thyroid disease     SURGICAL HISTORY: Past Surgical History:  Procedure Laterality Date   COLONOSCOPY N/A 11/18/2016   Procedure: COLONOSCOPY IN O.R;  Surgeon: Kieth Brightly, MD;  Location: [facility] ORS;  Service: General;  Laterality: N/A;   COLONOSCOPY WITH PROPOFOL N/A 11/18/2016   Procedure: COLONOSCOPY  WITH PROPOFOL;  Surgeon: Kieth Brightly, MD;  Location: [facility] ENDOSCOPY;  Service: Endoscopy;  Laterality: N/A;   HEMORRHOID SURGERY N/A 11/18/2016   Procedure: HEMORRHOIDECTOMY;  Surgeon: Kieth Brightly, MD;  Location: [facility] ORS;  Service: General;  Laterality: N/A;   IR IMAGING GUIDED PORT INSERTION  04/20/2022   TONSILLECTOMY     as a child    SOCIAL HISTORY: Social History   Socioeconomic History   Marital status: Legally Separated     Spouse name: Not on file   Number of children: Not on file   Years of education: Not on file   Highest education level: Not on file  Occupational History   Not on file  Tobacco Use   Smoking status: Never   Smokeless tobacco: Never  Vaping Use   Vaping status: Never Used  Substance and Sexual Activity   Alcohol use: Not Currently    Alcohol/week: 1.0 standard drink of alcohol    Types: 1 Shots of liquor per week    Comment: occ   Drug use: No   Sexual activity: Not on file  Other Topics Concern   Not on file  Social History Narrative   Not on file   Social Determinants of Health   Financial Resource Strain: Low Risk  (04/15/2022)   Overall Financial Resource Strain (CARDIA)    Difficulty of Paying Living Expenses: Not very hard  Recent Concern: Financial Resource Strain - Medium Risk (04/15/2022)   Overall Financial Resource Strain (CARDIA)    Difficulty of Paying Living Expenses: Somewhat hard  Food Insecurity: No Food Insecurity (04/15/2022)   Hunger Vital Sign    Worried About Running Out of Food in the Last Year: Never true    Ran Out of Food in the Last Year: Never true  Transportation Needs: No Transportation Needs (06/16/2022)   PRAPARE - Administrator, Civil Service (Medical): No    Lack of Transportation (Non-Medical): No  Physical Activity: Inactive (04/15/2022)   Exercise Vital Sign    Days of Exercise per Week: 0 days    Minutes of Exercise per Session: 0 min  Stress: Stress Concern Present (04/15/2022)   [employer] of Occupational Health - Occupational Stress Questionnaire    Feeling of Stress : To some extent  Social Connections: Moderately Integrated (04/15/2022)   Social Connection and Isolation Panel [NHANES]    Frequency of Communication with Friends and Family: Three times a week    Frequency of Social Gatherings with Friends and Family: Twice a week    Attends Religious Services: 1 to 4 times per year    Active Member of  [employer] or Organizations: No    Attends Banker Meetings: Never    Marital Status: Living with partner  Intimate Partner Violence: Not At Risk (04/15/2022)   Humiliation, Afraid, Rape, and Kick questionnaire    Fear of Current or Ex-Partner: No    Emotionally Abused: No    Physically Abused: No    Sexually Abused: No    FAMILY HISTORY: Family History  Problem Relation Age of Onset   Cancer Other    Cancer Paternal Uncle     ALLERGIES:  has No Known Allergies.  MEDICATIONS:  Current Outpatient Medications  Medication Sig Dispense Refill   Glucose Blood (TRUE METRIX BLOOD GLUCOSE TEST VI) as directed.     LANTUS SOLOSTAR 100 UNIT/ML Solostar Pen Inject into the skin. Pt is on a sliding scale; 20 units if blood glucose is  at 200; takes 25 units if blood glucose on is 225     levothyroxine (SYNTHROID) 150 MCG tablet Take 150 mcg by mouth daily.     metFORMIN (GLUCOPHAGE) 1000 MG tablet 2 (two) times daily.     TRUEPLUS 5-BEVEL PEN NEEDLES 31G X 6 MM MISC      Acetaminophen (TYLENOL EXTRA STRENGTH PO) Take 1 tablet by mouth every 6 (six) hours as needed (constipation). (Patient not taking: Reported on 01/20/2023)     acyclovir (ZOVIRAX) 400 MG tablet Take 1 tablet (400 mg total) by mouth daily. (Patient not taking: Reported on 10/15/2022) 30 tablet 3   allopurinol (ZYLOPRIM) 100 MG tablet Take 1 tablet (100 mg total) by mouth daily. (Patient not taking: Reported on 10/15/2022) 30 tablet 0   Docusate Calcium (STOOL SOFTENER PO) Take by mouth. (Patient not taking: Reported on 01/20/2023)     HYDROcodone-acetaminophen (NORCO/VICODIN) 5-325 MG tablet Take 1 tablet by mouth every 6 (six) hours as needed. (Patient not taking: Reported on 06/16/2022)     lactulose (CHRONULAC) 10 GM/15ML solution Take 15 mLs (10 g total) by mouth daily as needed for mild constipation. (Patient not taking: Reported on 07/20/2022) 236 mL 0   lidocaine-prilocaine (EMLA) cream Apply to affected area once  (Patient not taking: Reported on 01/20/2023) 30 g 3   loratadine (CLARITIN) 10 MG tablet Take 10 mg by mouth daily. (Patient not taking: Reported on 10/15/2022)     ondansetron (ZOFRAN) 8 MG tablet Take 1 tablet (8 mg total) by mouth every 8 (eight) hours as needed for nausea or vomiting. Start on the third day after cyclophosphamide chemotherapy. (Patient not taking: Reported on 08/10/2022) 30 tablet 1   oxyCODONE-acetaminophen (PERCOCET) 5-325 MG tablet Take 1 tablet by mouth every 6 (six) hours as needed for severe pain. (Patient not taking: Reported on 07/20/2022) 60 tablet 0   prochlorperazine (COMPAZINE) 10 MG tablet Take 1 tablet (10 mg total) by mouth every 6 (six) hours as needed for nausea or vomiting. (Patient not taking: Reported on 06/16/2022) 30 tablet 6   No current facility-administered medications for this visit.    Review of Systems  Constitutional:  Negative for appetite change, chills, fatigue, fever and unexpected weight change.  HENT:   Negative for voice change.   Eyes:  Negative for eye problems and icterus.  Respiratory:  Negative for chest tightness, cough and shortness of breath.   Cardiovascular:  Negative for chest pain and leg swelling.  Gastrointestinal:  Negative for abdominal distention and abdominal pain.  Endocrine: Negative for hot flashes.  Genitourinary:  Negative for difficulty urinating, dysuria and frequency.   Musculoskeletal:  Negative for arthralgias.  Skin:  Negative for itching and rash.  Neurological:  Negative for light-headedness and numbness.  Hematological:  Negative for adenopathy. Does not bruise/bleed easily.  Psychiatric/Behavioral:  Negative for confusion.    PHYSICAL EXAMINATION: ECOG PERFORMANCE STATUS: 1 - Symptomatic but completely ambulatory Vitals:   01/20/23 1436  BP: (!) 127/90  Pulse: 73  Resp: 18  Temp: (!) 96.9 F (36.1 C)  SpO2: 98%   Filed Weights   01/20/23 1436  Weight: 183 lb 14.4 oz (83.4 kg)    Physical  Exam Constitutional:      General: He is not in acute distress. HENT:     Head: Normocephalic and atraumatic.  Eyes:     General: No scleral icterus. Cardiovascular:     Rate and Rhythm: Normal rate.  Pulmonary:     Effort: Pulmonary effort  is normal. No respiratory distress.  Abdominal:     General: There is no distension.  Musculoskeletal:        General: Normal range of motion.     Cervical back: Normal range of motion and neck supple.  Skin:    Findings: No rash.  Neurological:     Mental Status: He is alert and oriented to person, place, and time. Mental status is at baseline.     Cranial Nerves: No cranial nerve deficit.  Psychiatric:        Mood and Affect: Mood normal.     LABORATORY DATA:  I have reviewed the data as listed    Latest Ref Rng & Units 01/20/2023    2:20 PM 10/15/2022    8:37 AM 08/10/2022    8:22 AM  CBC  WBC 4.0 - 10.5 K/uL 5.5  5.2  7.6   Hemoglobin 13.0 - 17.0 g/dL 40.9  81.1  91.4   Hematocrit 39.0 - 52.0 % 38.5  40.2  35.8   Platelets 150 - 400 K/uL 147  152  212       Latest Ref Rng & Units 01/20/2023    2:20 PM 01/14/2023    3:44 PM 10/15/2022    8:37 AM  CMP  Glucose 70 - 99 mg/dL 782   956   BUN 8 - 23 mg/dL 25   22   Creatinine 2.13 - 1.24 mg/dL 0.86  5.78  4.69   Sodium 135 - 145 mmol/L 132   134   Potassium 3.5 - 5.1 mmol/L 3.5   3.8   Chloride 98 - 111 mmol/L 102   103   CO2 22 - 32 mmol/L 22   22   Calcium 8.9 - 10.3 mg/dL 9.3   9.3   Total Protein 6.5 - 8.1 g/dL 7.1   7.1   Total Bilirubin 0.3 - 1.2 mg/dL 0.7   0.7   Alkaline Phos 38 - 126 U/L 71   87   AST 15 - 41 U/L 31   30   ALT 0 - 44 U/L 37   24       RADIOGRAPHIC STUDIES: I have personally reviewed the radiological images as listed and agreed with the findings in the report. CT CHEST ABDOMEN PELVIS W CONTRAST  Result Date: 01/14/2023 CLINICAL DATA:  History of diffuse large B-cell lymphoma, follow-up. * Tracking Code: BO * EXAM: CT CHEST, ABDOMEN, AND PELVIS  WITH CONTRAST TECHNIQUE: Multidetector CT imaging of the chest, abdomen and pelvis was performed following the standard protocol during bolus administration of intravenous contrast. RADIATION DOSE REDUCTION: This exam was performed according to the departmental dose-optimization program which includes automated exposure control, adjustment of the mA and/or kV according to patient size and/or use of iterative reconstruction technique. CONTRAST:  OMNIPAQUE IOHEXOL 300 MG/ML  SOLN COMPARISON:  Multiple priors including most recent PET-CT October 13, 2022 FINDINGS: CT CHEST FINDINGS Cardiovascular: Accessed right chest Port-A-Cath with tip at the superior cavoatrial junction. Aortic atherosclerosis. No central pulmonary embolus on this nondedicated study. Normal size heart. No significant pericardial effusion/thickening. Mediastinum/Nodes: No suspicious thyroid nodule. No pathologically enlarged mediastinal, hilar or axillary lymph nodes. The esophagus is grossly unremarkable. Lungs/Pleura: Biapical pleuroparenchymal scarring. Hypoventilatory change in the dependent lungs. No suspicious pulmonary nodules or masses. No focal airspace consolidation. Musculoskeletal: No aggressive lytic or blastic lesion of bone. CT ABDOMEN PELVIS FINDINGS Hepatobiliary: No significant interval change in the hypodense hepatic lesions. No new suspicious hepatic lesions. Previously  indexed lesions include: -Segment V lesion with overlying capsular retraction measures 3.9 x 3.0 cm on image 63/2 previously 4.2 x 3.1 cm when remeasured for consistency. Segment VIII/IV lesion measures 5.1 x 3.0 cm on image 56/2 previously 5.7 x 2.7 cm. Segment VII hepatic lesion measures 2 cm on image 51/2, unchanged. Compensatory hypertrophy of the left lobe of the liver. Gallbladder is unremarkable. No biliary ductal dilation. Pancreas: No pancreatic ductal dilation or evidence of acute inflammation. Spleen: No splenomegaly or focal splenic lesion  Adrenals/Urinary Tract: Bilateral adrenal glands appear normal. No hydronephrosis. Kidneys demonstrate symmetric enhancement urinary bladder is unremarkable for degree of distension Stomach/Bowel: Radiopaque enteric contrast material traverses the hepatic flexure. Stomach is unremarkable for degree of distension. No pathologic dilation of small or large bowel. No evidence of acute bowel inflammation. Vascular/Lymphatic: Aortic atherosclerosis. Smooth IVC contours. The portal, splenic and superior mesenteric veins are patent. No pathologically enlarged abdominal or pelvic lymph nodes. Reproductive: Dystrophic prostatic calcifications. Other: No significant abdominopelvic free fluid. Musculoskeletal: No aggressive lytic or blastic lesion of bone IMPRESSION: 1. No significant interval change in the hypodense hepatic lesions. No new suspicious hepatic lesions. 2. No evidence of new or progressive disease in the chest, abdomen or pelvis. 3.  Aortic Atherosclerosis (ICD10-I70.0). Electronically Signed   By: Maudry Mayhew M.D.   On: 01/14/2023 16:53

## 2023-01-20 NOTE — Assessment & Plan Note (Signed)
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  PET scan was reviewed and discussed with patient.- liver, spleen, possible  thyroid,  involvements.  Marrow is negative on PET.  bone marrow biopsy is negative.   IPI score is 4. CNS IPI - 4 S/p 6 cycle of R-Pola -CHP with GCSF - 5 cycles of IT MTX/hydrocortisone.  PET restaging 8 weeks after chemo showed CR, Deuville 1 Follow up CT scan showed no recurrence, liver lesions decreased in size.  Recommend continue surveillance. Repeat CT in 6 months.

## 2023-01-20 NOTE — Assessment & Plan Note (Signed)
Chronic, continue monitor

## 2023-01-20 NOTE — Assessment & Plan Note (Signed)
Recommend port flush Q6-8 weeks.

## 2023-01-21 ENCOUNTER — Other Ambulatory Visit: Payer: Self-pay

## 2023-01-21 ENCOUNTER — Other Ambulatory Visit: Payer: 59

## 2023-01-21 ENCOUNTER — Ambulatory Visit: Payer: 59 | Admitting: Oncology

## 2023-03-03 ENCOUNTER — Inpatient Hospital Stay: Payer: 59 | Attending: Oncology

## 2023-03-03 DIAGNOSIS — C8330 Diffuse large B-cell lymphoma, unspecified site: Secondary | ICD-10-CM

## 2023-03-03 DIAGNOSIS — C8339 Diffuse large B-cell lymphoma, extranodal and solid organ sites: Secondary | ICD-10-CM | POA: Insufficient documentation

## 2023-03-03 MED ORDER — HEPARIN SOD (PORK) LOCK FLUSH 100 UNIT/ML IV SOLN
500.0000 [IU] | Freq: Once | INTRAVENOUS | Status: AC
  Administered 2023-03-03: 500 [IU] via INTRAVENOUS
  Filled 2023-03-03: qty 5

## 2023-03-03 NOTE — Progress Notes (Signed)
Survivorship Care Plan visit completed.  Treatment summary reviewed and given to patient.  ASCO answers booklet reviewed and given to patient.  CARE program and Cancer Transitions discussed with patient along with other resources cancer center offers to patients and caregivers.  Patient verbalized understanding.    

## 2023-04-14 ENCOUNTER — Inpatient Hospital Stay: Payer: 59 | Attending: Oncology

## 2023-04-14 DIAGNOSIS — C8330 Diffuse large B-cell lymphoma, unspecified site: Secondary | ICD-10-CM | POA: Diagnosis present

## 2023-04-14 DIAGNOSIS — Z95828 Presence of other vascular implants and grafts: Secondary | ICD-10-CM

## 2023-04-14 MED ORDER — HEPARIN SOD (PORK) LOCK FLUSH 100 UNIT/ML IV SOLN
500.0000 [IU] | Freq: Once | INTRAVENOUS | Status: AC
  Administered 2023-04-14: 500 [IU] via INTRAVENOUS
  Filled 2023-04-14: qty 5

## 2023-04-14 MED ORDER — SODIUM CHLORIDE 0.9% FLUSH
10.0000 mL | Freq: Once | INTRAVENOUS | Status: AC
  Administered 2023-04-14: 10 mL via INTRAVENOUS
  Filled 2023-04-14: qty 10

## 2023-05-26 ENCOUNTER — Inpatient Hospital Stay: Payer: 59 | Attending: Oncology

## 2023-05-26 DIAGNOSIS — Z23 Encounter for immunization: Secondary | ICD-10-CM | POA: Diagnosis present

## 2023-05-26 DIAGNOSIS — Z95828 Presence of other vascular implants and grafts: Secondary | ICD-10-CM

## 2023-05-26 MED ORDER — HEPARIN SOD (PORK) LOCK FLUSH 100 UNIT/ML IV SOLN
500.0000 [IU] | Freq: Once | INTRAVENOUS | Status: AC
  Administered 2023-05-26: 500 [IU] via INTRAVENOUS
  Filled 2023-05-26: qty 5

## 2023-05-26 MED ORDER — SODIUM CHLORIDE 0.9% FLUSH
10.0000 mL | Freq: Once | INTRAVENOUS | Status: AC
  Administered 2023-05-26: 10 mL via INTRAVENOUS
  Filled 2023-05-26: qty 10

## 2023-05-26 MED ORDER — INFLUENZA VIRUS VACC SPLIT PF (FLUZONE) 0.5 ML IM SUSY
0.5000 mL | PREFILLED_SYRINGE | Freq: Once | INTRAMUSCULAR | Status: AC
  Administered 2023-05-26: 0.5 mL via INTRAMUSCULAR

## 2023-07-14 ENCOUNTER — Encounter: Payer: Self-pay | Admitting: Oncology

## 2023-07-21 ENCOUNTER — Inpatient Hospital Stay: Payer: Medicaid Other | Attending: Oncology

## 2023-07-21 ENCOUNTER — Ambulatory Visit
Admission: RE | Admit: 2023-07-21 | Discharge: 2023-07-21 | Disposition: A | Discharge status: Home / Self Care | Payer: Medicaid Other | Source: Ambulatory Visit | Attending: Oncology | Admitting: Oncology

## 2023-07-21 DIAGNOSIS — C8338 Diffuse large B-cell lymphoma, lymph nodes of multiple sites: Secondary | ICD-10-CM | POA: Insufficient documentation

## 2023-07-21 DIAGNOSIS — Z8572 Personal history of non-Hodgkin lymphomas: Secondary | ICD-10-CM | POA: Insufficient documentation

## 2023-07-21 DIAGNOSIS — Z95828 Presence of other vascular implants and grafts: Secondary | ICD-10-CM

## 2023-07-21 LAB — CMP (CANCER CENTER ONLY)
ALT: 56 U/L — ABNORMAL HIGH (ref 0–44)
AST: 37 U/L (ref 15–41)
Albumin: 4.7 g/dL (ref 3.5–5.0)
Alkaline Phosphatase: 79 U/L (ref 38–126)
Anion gap: 11 (ref 5–15)
BUN: 26 mg/dL — ABNORMAL HIGH (ref 8–23)
CO2: 21 mmol/L — ABNORMAL LOW (ref 22–32)
Calcium: 9.4 mg/dL (ref 8.9–10.3)
Chloride: 100 mmol/L (ref 98–111)
Creatinine: 0.89 mg/dL (ref 0.61–1.24)
GFR, Estimated: >60 mL/min (ref >60)
Glucose, Bld: 188 mg/dL — ABNORMAL HIGH (ref 70–99)
Potassium: 4.3 mmol/L (ref 3.5–5.1)
Sodium: 132 mmol/L — ABNORMAL LOW (ref 135–145)
Total Bilirubin: 0.9 mg/dL (ref 0.0–1.2)
Total Protein: 7.4 g/dL (ref 6.5–8.1)

## 2023-07-21 LAB — CBC WITH DIFFERENTIAL (CANCER CENTER ONLY)
Abs Immature Granulocytes: 0.05 10*3/uL (ref 0.00–0.07)
Basophils Absolute: 0.1 10*3/uL (ref 0.0–0.1)
Basophils Relative: 1 %
Eosinophils Absolute: 0.1 10*3/uL (ref 0.0–0.5)
Eosinophils Relative: 2 %
HCT: 42.1 % (ref 39.0–52.0)
Hemoglobin: 14.9 g/dL (ref 13.0–17.0)
Immature Granulocytes: 1 %
Lymphocytes Relative: 20 %
Lymphs Abs: 1.3 10*3/uL (ref 0.7–4.0)
MCH: 30.5 pg (ref 26.0–34.0)
MCHC: 35.4 g/dL (ref 30.0–36.0)
MCV: 86.3 fL (ref 80.0–100.0)
Monocytes Absolute: 0.8 10*3/uL (ref 0.1–1.0)
Monocytes Relative: 13 %
Neutro Abs: 4 10*3/uL (ref 1.7–7.7)
Neutrophils Relative %: 63 %
Platelet Count: 171 10*3/uL (ref 150–400)
RBC: 4.88 MIL/uL (ref 4.22–5.81)
RDW: 12.7 % (ref 11.5–15.5)
WBC Count: 6.2 10*3/uL (ref 4.0–10.5)
nRBC: 0 % (ref 0.0–0.2)

## 2023-07-21 LAB — LACTATE DEHYDROGENASE: LDH: 138 U/L (ref 98–192)

## 2023-07-21 MED ORDER — HEPARIN SOD (PORK) LOCK FLUSH 100 UNIT/ML IV SOLN
500.0000 [IU] | Freq: Once | INTRAVENOUS | Status: AC
  Administered 2023-07-21: 500 [IU] via INTRAVENOUS
  Filled 2023-07-21: qty 5

## 2023-07-21 MED ORDER — HEPARIN SOD (PORK) LOCK FLUSH 100 UNIT/ML IV SOLN
INTRAVENOUS | Status: AC
  Filled 2023-07-21: qty 5

## 2023-07-21 MED ORDER — SODIUM CHLORIDE 0.9% FLUSH
10.0000 mL | Freq: Once | INTRAVENOUS | Status: AC
  Administered 2023-07-21: 10 mL via INTRAVENOUS
  Filled 2023-07-21: qty 10

## 2023-07-21 MED ORDER — IOHEXOL 300 MG/ML  SOLN
100.0000 mL | Freq: Once | INTRAMUSCULAR | Status: AC | PRN
  Administered 2023-07-21: 100 mL via INTRAVENOUS

## 2023-07-21 MED ORDER — HEPARIN SOD (PORK) LOCK FLUSH 100 UNIT/ML IV SOLN
500.0000 [IU] | Freq: Once | INTRAVENOUS | Status: AC
  Filled 2023-07-21: qty 5

## 2023-07-28 ENCOUNTER — Encounter: Payer: Self-pay | Admitting: Oncology

## 2023-07-28 ENCOUNTER — Inpatient Hospital Stay: Payer: Medicaid Other | Attending: Oncology | Admitting: Oncology

## 2023-07-28 VITALS — BP 154/91 | Temp 98.7°F | Resp 18 | Wt 195.0 lb

## 2023-07-28 DIAGNOSIS — C8338 Diffuse large B-cell lymphoma, lymph nodes of multiple sites: Secondary | ICD-10-CM

## 2023-07-28 DIAGNOSIS — E871 Hypo-osmolality and hyponatremia: Secondary | ICD-10-CM

## 2023-07-28 DIAGNOSIS — Z95828 Presence of other vascular implants and grafts: Secondary | ICD-10-CM | POA: Diagnosis not present

## 2023-07-28 DIAGNOSIS — Z8572 Personal history of non-Hodgkin lymphomas: Secondary | ICD-10-CM | POA: Diagnosis present

## 2023-07-28 DIAGNOSIS — K769 Liver disease, unspecified: Secondary | ICD-10-CM | POA: Insufficient documentation

## 2023-07-28 DIAGNOSIS — Z809 Family history of malignant neoplasm, unspecified: Secondary | ICD-10-CM | POA: Diagnosis not present

## 2023-07-28 NOTE — Progress Notes (Signed)
 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  PET scan was reviewed and discussed with patient.- liver, spleen, possible  thyroid ,  involvements.  Marrow is negative on PET.  bone marrow biopsy is negative.   IPI score is 4. CNS IPI - 4 S/p 6 cycle of R-Pola -CHP with GCSF - 5 cycles of IT MTX/hydrocortisone .  PET restaging 8 weeks after chemo showed CR, Deuville 1 Follow up CT scan showed no recurrence, liver lesions decreased in size, slightly more prominent lymphadenopathy, could be due to recent common cold.  Recommend continue surveillance. Repeat CT in 3 months.   Hyponatremia Chronic, continue monitor  Port-A-Cath in place Recommend port flush Q6-8 weeks.   Orders Placed This Encounter  Procedures   CT CHEST ABDOMEN PELVIS W CONTRAST    Standing Status:   Future    Expected Date:   10/25/2023    Expiration Date:   07/27/2024    If indicated for the ordered procedure, I authorize the administration of contrast media per Radiology protocol:   Yes    Does the patient have a contrast media/X-ray dye allergy?:   No    Preferred imaging location?:   [facility]    If indicated for the ordered procedure, I authorize the administration of oral contrast media per Radiology protocol:   Yes   CBC with Differential (Cancer Center Only)    Standing Status:   Future    Expected Date:   10/25/2023    Expiration Date:   07/27/2024   CMP (Cancer Center only)    Standing Status:   Future    Expected Date:   10/25/2023     Expiration Date:   07/27/2024    Follow up 6 months  All questions were answered. The patient knows to call the clinic with any problems, questions or concerns.  Call Babara, MD, PhD [facility] Hematology Oncology 07/28/2023     HISTORY OF PRESENTING ILLNESS:   03/22/2022, patient presented to emergency room for evaluation of right side lower chest/upper abdomen/flank pain, intermittent low-grade fever and chills, night sweats and unintentional weight loss of 20 to 30 pounds over the past 6 months.  He finished a course of antibiotics a few months ago for unknown infection and symptom has not improved.  Oncology History  Diffuse large B cell lymphoma (HCC)  03/22/2022 Imaging   CT chest angiogram, CT abdomen pelvis w contrast  1. No acute pulmonary embolism. 2. There are multiple heterogeneously enhancing masses throughout the liver, several which demonstrate capsular retraction. No definitive extrahepatic primary is identified. Findings could reflect multifocal hepatocellular carcinoma/cholangiocarcinoma, lymphoma or abscess. Recommend correlation with blood chemistry values. Liver MRI with and without contrast could be of assistance in further characterizing these masses. 3. Splenomegaly.4. Multiple borderline enlarged lymph nodes above and below the diaphragm. 5. Homogeneously enlarged thyroid  gland, nonspecific. Consider further dedicated evaluation with nonemergent thyroid  ultrasound. 6. Mild bronchial wall thickening with bibasilar atelectasis. This could reflect a nonspecific bronchitis.   03/22/2022 Imaging   MRI liver w wo contrast 1. Bilobar  hepatic lesions demonstrate irregular contour with minimally increased T2 signal, hypointense intrinsic T1 signal, postcontrast enhancement that is slowly increases and is most prominent in intensities 5 minutes postcontrast administration as well as a peripheral rim of reduced diffusivity.  2. Enlarged upper abdominal lymph nodes, likely reflect  metastatic nodal disease involvement. 3. Enhancing lesions in the right tenth rib and L2 vertebral body,concerning for osseous metastatic disease. 4. Prominent subcentimeter pericardiophrenic and internal mammary lymph nodes also suspicious for nodal metastatic disease.    03/31/2022 Cancer Staging   Staging form: Hodgkin and Non-Hodgkin Lymphoma, AJCC 8th Edition - Clinical stage from 03/31/2022: Stage IV (Diffuse large B-cell lymphoma) - Signed by Babara Call, MD on 04/19/2022 Stage prefix: Initial diagnosis International Prognostic Index (IPI) score: Score 4 International Prognostic Index risk group: High risk DLBCL cell of origin (COO): Activated B-cell   04/03/2022 Initial Diagnosis   Large cell lymphoma (HCC)  04/03/2022, ultrasound-guided liver biopsy showed large B-cell lymphoma.  Additional IHC is pending for subclassification as well as ancillary FISH testing for rearrangementds   04/16/2022 Imaging   PET initial staging showed 1. Multiple bulky intensely hypermetabolic liver masses throughout both liver lobes, compatible with reported lymphoma. Deauville score 5. 2. Mild splenomegaly with mild splenic hypermetabolism, compatible with lymphoma. No enlarged or hypermetabolic lymph nodes. No hypermetabolic skeletal activity. 3. Multinodular goiter with intensely hypermetabolic thyroid  nodules, also potentially due to lymphoma. 4. Chronic findings include: Aortic Atherosclerosis.Mild sigmoid diverticulosis.     04/20/2022 Procedure   Medi port placed by IR   04/22/2022 -  Chemotherapy   Patient is on Treatment Plan : NON-HODGKINS LYMPHOMA R-POLA CHP q21d     06/05/2022 Imaging   Restaging PET scan showed -PET-CT findings consistent with a complete metabolic response. No residual hypermetabolism involving the liver (Deauville 1). All measurable liver lesions are much smaller.   Mild residual FDG uptake in the thyroid  gland. This could be due to treated thyroiditis. Treated  thyroid  gland involvement with lymphoma is also possible (Deauville 2).    10/13/2022 Imaging   PET scan 8 weeks after chemotherapy  showed  1. No signs of residual or recurrent FDG avid tumor within the neck,chest, abdomen or pelvis. (Deauville criteria 1). 2. Continued decrease in size of treated lesions within the liver. 3. Similar appearance of mild diffuse increased uptake within both lobes of thyroid  gland. Consider correlation with thyroid  function tests. 4.  Aortic Atherosclerosis     INTERVAL HISTORY Joe Adams is a 63 y.o. male who has above history reviewed by me today presents for follow up visit for large B-cell lymphoma.   Today he reports feeling well.  Denies weight loss, fever, chills, fatigue, night sweats.  Good appetite and he has gained weight.  He had a common cold a few week ago.   MEDICAL HISTORY:  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   Thyroid  disease     SURGICAL HISTORY: Past Surgical History:  Procedure Laterality Date   COLONOSCOPY N/A 11/18/2016   Procedure: COLONOSCOPY IN O.R;  Surgeon: Dellie Louanne MATSU, MD;  Location: [facility] ORS;  Service: General;  Laterality: N/A;   COLONOSCOPY WITH PROPOFOL  N/A 11/18/2016   Procedure: COLONOSCOPY WITH PROPOFOL ;  Surgeon: Dellie Louanne MATSU, MD;  Location:  [facility] ENDOSCOPY;  Service: Endoscopy;  Laterality: N/A;   HEMORRHOID SURGERY N/A 11/18/2016   Procedure: HEMORRHOIDECTOMY;  Surgeon: Dellie Louanne MATSU, MD;  Location: [facility] ORS;  Service: General;  Laterality: N/A;   IR IMAGING GUIDED PORT INSERTION  04/20/2022   TONSILLECTOMY     as a child    SOCIAL HISTORY: Social History   Socioeconomic History   Marital status: Divorced    Spouse name: Not on file   Number of children:  Not on file   Years of education: Not on file   Highest education level: Not on file  Occupational History   Not on file  Tobacco Use   Smoking status: Never   Smokeless tobacco: Never  Vaping Use   Vaping status: Never Used  Substance and Sexual Activity   Alcohol use: Not Currently    Alcohol/week: 1.0 standard drink of alcohol    Types: 1 Shots of liquor per week    Comment: occ   Drug use: No   Sexual activity: Not on file  Other Topics Concern   Not on file  Social History Narrative   Not on file   Social Drivers of Health   Financial Resource Strain: Low Risk  (04/15/2022)   Overall Financial Resource Strain (CARDIA)    Difficulty of Paying Living Expenses: Not very hard  Recent Concern: Financial Resource Strain - Medium Risk (04/15/2022)   Overall Financial Resource Strain (CARDIA)    Difficulty of Paying Living Expenses: Somewhat hard  Food Insecurity: No Food Insecurity (04/15/2022)   Hunger Vital Sign    Worried About Running Out of Food in the Last Year: Never true    Ran Out of Food in the Last Year: Never true  Transportation Needs: No Transportation Needs (06/16/2022)   PRAPARE - Administrator, Civil Service (Medical): No    Lack of Transportation (Non-Medical): No  Physical Activity: Inactive (04/15/2022)   Exercise Vital Sign    Days of Exercise per Week: 0 days    Minutes of Exercise per Session: 0 min  Stress: Stress Concern Present (04/15/2022)   [employer] of Occupational Health - Occupational Stress Questionnaire    Feeling of Stress : To some extent  Social Connections: Moderately Integrated (04/15/2022)   Social Connection and Isolation Panel [NHANES]    Frequency of Communication with Friends and Family: Three times a week    Frequency of Social Gatherings with Friends and Family: Twice a week    Attends Religious Services: 1 to 4 times per year    Active Member of [employer] or Organizations: No    Attends Banker  Meetings: Never    Marital Status: Living with partner  Intimate Partner Violence: Not At Risk (04/15/2022)   Humiliation, Afraid, Rape, and Kick questionnaire    Fear of Current or Ex-Partner: No    Emotionally Abused: No    Physically Abused: No    Sexually Abused: No    FAMILY HISTORY: Family History  Problem Relation Age of Onset   Cancer Other    Cancer Paternal Uncle     ALLERGIES:  has no known allergies.  MEDICATIONS:  Current Outpatient Medications  Medication Sig Dispense Refill   levothyroxine (SYNTHROID) 150 MCG tablet Take 150 mcg by mouth daily.     metFORMIN (GLUCOPHAGE) 1000 MG tablet 2 (two) times daily.     Acetaminophen  (TYLENOL  EXTRA STRENGTH PO) Take 1 tablet by mouth every 6 (six) hours as needed (constipation). (Patient not  taking: Reported on 07/28/2023)     Glucose Blood (TRUE METRIX BLOOD GLUCOSE TEST VI) as directed. (Patient not taking: Reported on 07/28/2023)     LANTUS SOLOSTAR 100 UNIT/ML Solostar Pen Inject into the skin. Pt is on a sliding scale; 20 units if blood glucose is at 200; takes 25 units if blood glucose on is 225 (Patient not taking: Reported on 07/28/2023)     lidocaine -prilocaine  (EMLA ) cream Apply to affected area once (Patient not taking: Reported on 07/28/2023) 30 g 3   TRUEPLUS 5-BEVEL PEN NEEDLES 31G X 6 MM MISC  (Patient not taking: Reported on 07/28/2023)     No current facility-administered medications for this visit.   Facility-Administered Medications Ordered in Other Visits  Medication Dose Route Frequency Provider Last Rate Last Admin   heparin  lock flush 100 unit/mL  500 Units Intravenous Once Boots Mcglown, MD        Review of Systems  Constitutional:  Negative for appetite change, chills, fatigue, fever and unexpected weight change.  HENT:   Negative for voice change.   Eyes:  Negative for eye problems and icterus.  Respiratory:  Negative for chest tightness, cough and shortness of breath.   Cardiovascular:  Negative for chest  pain and leg swelling.  Gastrointestinal:  Negative for abdominal distention and abdominal pain.  Endocrine: Negative for hot flashes.  Genitourinary:  Negative for difficulty urinating, dysuria and frequency.   Musculoskeletal:  Negative for arthralgias.  Skin:  Negative for itching and rash.  Neurological:  Negative for light-headedness and numbness.  Hematological:  Negative for adenopathy. Does not bruise/bleed easily.  Psychiatric/Behavioral:  Negative for confusion.    PHYSICAL EXAMINATION: ECOG PERFORMANCE STATUS: 1 - Symptomatic but completely ambulatory Vitals:   07/28/23 1423 07/28/23 1426  BP: (!) 136/101 (!) 154/91  Resp: 18   Temp: 98.7 F (37.1 C)    Filed Weights   07/28/23 1423  Weight: 195 lb (88.5 kg)    Physical Exam Constitutional:      General: He is not in acute distress. HENT:     Head: Normocephalic and atraumatic.  Eyes:     General: No scleral icterus. Cardiovascular:     Rate and Rhythm: Normal rate.  Pulmonary:     Effort: Pulmonary effort is normal. No respiratory distress.  Abdominal:     General: There is no distension.  Musculoskeletal:        General: Normal range of motion.     Cervical back: Normal range of motion and neck supple.  Skin:    Findings: No rash.  Neurological:     Mental Status: He is alert and oriented to person, place, and time. Mental status is at baseline.     Cranial Nerves: No cranial nerve deficit.  Psychiatric:        Mood and Affect: Mood normal.     LABORATORY DATA:  I have reviewed the data as listed    Latest Ref Rng & Units 07/21/2023    8:41 AM 01/20/2023    2:20 PM 10/15/2022    8:37 AM  CBC  WBC 4.0 - 10.5 K/uL 6.2  5.5  5.2   Hemoglobin 13.0 - 17.0 g/dL 85.0  86.0  85.8   Hematocrit 39.0 - 52.0 % 42.1  38.5  40.2   Platelets 150 - 400 K/uL 171  147  152       Latest Ref Rng & Units 07/21/2023    8:41 AM 01/20/2023    2:20 PM 01/14/2023  3:44 PM  CMP  Glucose 70 - 99 mg/dL 811  857     BUN 8 - 23 mg/dL 26  25    Creatinine 9.38 - 1.24 mg/dL 9.10  9.24  9.09   Sodium 135 - 145 mmol/L 132  132    Potassium 3.5 - 5.1 mmol/L 4.3  3.5    Chloride 98 - 111 mmol/L 100  102    CO2 22 - 32 mmol/L 21  22    Calcium 8.9 - 10.3 mg/dL 9.4  9.3    Total Protein 6.5 - 8.1 g/dL 7.4  7.1    Total Bilirubin 0.0 - 1.2 mg/dL 0.9  0.7    Alkaline Phos 38 - 126 U/L 79  71    AST 15 - 41 U/L 37  31    ALT 0 - 44 U/L 56  37        RADIOGRAPHIC STUDIES: I have personally reviewed the radiological images as listed and agreed with the findings in the report. CT CHEST ABDOMEN PELVIS W CONTRAST Result Date: 07/21/2023 CLINICAL DATA:  Diffuse large B-cell lymphoma, follow-up. * Tracking Code: BO * EXAM: CT CHEST, ABDOMEN, AND PELVIS WITH CONTRAST TECHNIQUE: Multidetector CT imaging of the chest, abdomen and pelvis was performed following the standard protocol during bolus administration of intravenous contrast. RADIATION DOSE REDUCTION: This exam was performed according to the departmental dose-optimization program which includes automated exposure control, adjustment of the mA and/or kV according to patient size and/or use of iterative reconstruction technique. CONTRAST:  OMNIPAQUE  IOHEXOL  300 MG/ML  SOLN COMPARISON:  Multiple priors including CT January 14, 2023. FINDINGS: CT CHEST FINDINGS Cardiovascular: Accessed right chest Port-A-Cath with tip at the superior cavoatrial junction. Aortic atherosclerosis. No central pulmonary embolus on this nondedicated study. Normal size heart. No significant pericardial effusion/thickening. Mediastinum/Nodes: No suspicious thyroid  nodule. Prominent mediastinal lymph nodes not pathologically enlarged by size criteria are increased from prior examination. For reference: -High right paratracheal lymph node measuring 8 mm in short axis on image 18/2 previously measured 4 mm -a precarinal lymph node measures 8 mm in short axis on image 24/2 previously 4 mm.  Lungs/Pleura: Biapical pleuroparenchymal scarring. New 4 mm right middle lobe pulmonary nodule on image 84/4. New peripheral left lower lobe ground-glass pulmonary nodule measuring 14 mm on image 90/4. Additional smaller foci of subpleural ground-glass for instance in the left upper lobe on image 66/4. Musculoskeletal: No aggressive lytic or blastic lesion of bone. Multilevel degenerative change of the spine. CT ABDOMEN PELVIS FINDINGS Hepatobiliary: Decreased size and conspicuity of the hypodense hepatic lesions with overlying capsular retraction. For reference: -segment V lesion measures 3.2 x 2.4 cm on image 63/2 previously 3.9 x 3.0 cm. -segment VIII 8/4 lesion measures 2.7 x 2.2 cm on image 56/2 previously 5.1 x 3.0 cm. -segment VII hepatic lesion measures 16 mm on image 51/2 previously 2 cm. Compensatory hypertrophy of the caudate and left lobe of the liver. Gallbladder is unremarkable. No biliary ductal dilation. Pancreas: No pancreatic ductal dilation or evidence of acute inflammation. Spleen: No splenomegaly. Adrenals/Urinary Tract: Bilateral adrenal glands appear normal. No hydronephrosis. Kidneys demonstrate symmetric enhancement. Urinary bladder is unremarkable for degree of distension. Stomach/Bowel: Radiopaque enteric contrast material traverses the hepatic flexure. Stomach is unremarkable for degree of distension. No pathologic dilation of small or large bowel. Colonic diverticulosis without findings of acute diverticulitis. Vascular/Lymphatic: Normal caliber abdominal aorta. Smooth IVC contours. Increased size of prominent gastrohepatic ligament, periportal and hepatoduodenal ligament lymph nodes. For  instance: -A gastrohepatic ligament lymph node measures 5 mm in short axis on image 52/7 previously 3 mm -portacaval lymph node measures 7 mm in short axis on image 64/2 previously 4 mm. Reproductive: Dystrophic prostatic calcifications. Other: Fat containing right inguinal hernia. Musculoskeletal: No  aggressive lytic or blastic lesion of bone. Limbus type L5 vertebral body. Multilevel degenerative changes spine. IMPRESSION: 1. Decreased size and conspicuity of the hypodense hepatic lesions with overlying capsular retraction. 2. Increased size of prominent mediastinal and upper abdominal lymph nodes, not pathologically enlarged by size criteria, nonspecific. 3. New 4 mm right middle lobe pulmonary nodule and new peripheral left lower lobe ground-glass pulmonary nodule measuring 14 mm, nonspecific and possibly infectious/inflammatory. Consider attention on short-term interval follow-up chest CT. 4. Colonic diverticulosis without findings of acute diverticulitis. 5.  Aortic Atherosclerosis (ICD10-I70.0). Electronically Signed   By: Reyes Holder M.D.   On: 07/21/2023 13:17

## 2023-07-28 NOTE — Assessment & Plan Note (Addendum)
 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  PET scan was reviewed and discussed with patient.- liver, spleen, possible  thyroid ,  involvements.  Marrow is negative on PET.  bone marrow biopsy is negative.   IPI score is 4. CNS IPI - 4 S/p 6 cycle of R-Pola -CHP with GCSF - 5 cycles of IT MTX/hydrocortisone .  PET restaging 8 weeks after chemo showed CR, Deuville 1 Follow up CT scan showed no recurrence, liver lesions decreased in size, slightly more prominent lymphadenopathy, could be due to recent common cold.  Recommend continue surveillance. Repeat CT in 3 months.

## 2023-07-28 NOTE — Assessment & Plan Note (Signed)
Chronic, continue monitor

## 2023-07-28 NOTE — Assessment & Plan Note (Signed)
 Recommend port flush Q6-8 weeks.

## 2023-07-29 ENCOUNTER — Other Ambulatory Visit: Payer: Self-pay

## 2023-09-08 ENCOUNTER — Inpatient Hospital Stay: Payer: Medicaid Other | Attending: Oncology

## 2023-10-25 ENCOUNTER — Encounter: Payer: Self-pay | Admitting: Oncology

## 2023-10-27 ENCOUNTER — Ambulatory Visit
Admission: RE | Admit: 2023-10-27 | Discharge: 2023-10-27 | Disposition: A | Discharge status: Home / Self Care | Payer: Medicaid Other | Source: Ambulatory Visit | Attending: Oncology | Admitting: Oncology

## 2023-10-27 ENCOUNTER — Inpatient Hospital Stay: Payer: Medicaid Other | Attending: Oncology

## 2023-10-27 ENCOUNTER — Other Ambulatory Visit: Payer: Medicaid Other

## 2023-10-27 DIAGNOSIS — E871 Hypo-osmolality and hyponatremia: Secondary | ICD-10-CM | POA: Insufficient documentation

## 2023-10-27 DIAGNOSIS — C8338 Diffuse large B-cell lymphoma, lymph nodes of multiple sites: Secondary | ICD-10-CM | POA: Insufficient documentation

## 2023-10-27 DIAGNOSIS — Z8572 Personal history of non-Hodgkin lymphomas: Secondary | ICD-10-CM | POA: Diagnosis present

## 2023-10-27 LAB — CBC WITH DIFFERENTIAL (CANCER CENTER ONLY)
Abs Immature Granulocytes: 0.03 10*3/uL (ref 0.00–0.07)
Basophils Absolute: 0.1 10*3/uL (ref 0.0–0.1)
Basophils Relative: 1 %
Eosinophils Absolute: 0.2 10*3/uL (ref 0.0–0.5)
Eosinophils Relative: 3 %
HCT: 39.3 % (ref 39.0–52.0)
Hemoglobin: 14.2 g/dL (ref 13.0–17.0)
Immature Granulocytes: 1 %
Lymphocytes Relative: 22 %
Lymphs Abs: 1.3 10*3/uL (ref 0.7–4.0)
MCH: 30.7 pg (ref 26.0–34.0)
MCHC: 36.1 g/dL — ABNORMAL HIGH (ref 30.0–36.0)
MCV: 85.1 fL (ref 80.0–100.0)
Monocytes Absolute: 0.7 10*3/uL (ref 0.1–1.0)
Monocytes Relative: 12 %
Neutro Abs: 3.7 10*3/uL (ref 1.7–7.7)
Neutrophils Relative %: 61 %
Platelet Count: 157 10*3/uL (ref 150–400)
RBC: 4.62 MIL/uL (ref 4.22–5.81)
RDW: 12.9 % (ref 11.5–15.5)
WBC Count: 5.9 10*3/uL (ref 4.0–10.5)
nRBC: 0 % (ref 0.0–0.2)

## 2023-10-27 LAB — CMP (CANCER CENTER ONLY)
ALT: 39 U/L (ref 0–44)
AST: 31 U/L (ref 15–41)
Albumin: 4.5 g/dL (ref 3.5–5.0)
Alkaline Phosphatase: 85 U/L (ref 38–126)
Anion gap: 11 (ref 5–15)
BUN: 22 mg/dL (ref 8–23)
CO2: 20 mmol/L — ABNORMAL LOW (ref 22–32)
Calcium: 8.7 mg/dL — ABNORMAL LOW (ref 8.9–10.3)
Chloride: 102 mmol/L (ref 98–111)
Creatinine: 0.97 mg/dL (ref 0.61–1.24)
GFR, Estimated: >60 mL/min (ref >60)
Glucose, Bld: 314 mg/dL — ABNORMAL HIGH (ref 70–99)
Potassium: 3.8 mmol/L (ref 3.5–5.1)
Sodium: 133 mmol/L — ABNORMAL LOW (ref 135–145)
Total Bilirubin: 1.3 mg/dL — ABNORMAL HIGH (ref 0.0–1.2)
Total Protein: 7.1 g/dL (ref 6.5–8.1)

## 2023-10-27 MED ORDER — HEPARIN SOD (PORK) LOCK FLUSH 100 UNIT/ML IV SOLN
INTRAVENOUS | Status: AC
  Filled 2023-10-27: qty 5

## 2023-10-27 MED ORDER — HEPARIN SOD (PORK) LOCK FLUSH 100 UNIT/ML IV SOLN
500.0000 [IU] | Freq: Once | INTRAVENOUS | Status: AC
  Administered 2023-10-27: 500 [IU] via INTRAVENOUS
  Filled 2023-10-27: qty 5

## 2023-10-27 MED ORDER — IOHEXOL 300 MG/ML  SOLN
100.0000 mL | Freq: Once | INTRAMUSCULAR | Status: AC | PRN
  Administered 2023-10-27: 100 mL via INTRAVENOUS

## 2023-11-10 ENCOUNTER — Inpatient Hospital Stay (HOSPITAL_BASED_OUTPATIENT_CLINIC_OR_DEPARTMENT_OTHER): Payer: Medicaid Other | Admitting: Oncology

## 2023-11-10 ENCOUNTER — Encounter: Payer: Self-pay | Admitting: Oncology

## 2023-11-10 VITALS — BP 127/87 | HR 90 | Temp 97.6°F | Resp 20 | Wt 185.0 lb

## 2023-11-10 DIAGNOSIS — C8338 Diffuse large B-cell lymphoma, lymph nodes of multiple sites: Secondary | ICD-10-CM

## 2023-11-10 DIAGNOSIS — E871 Hypo-osmolality and hyponatremia: Secondary | ICD-10-CM

## 2023-11-10 DIAGNOSIS — Z95828 Presence of other vascular implants and grafts: Secondary | ICD-10-CM | POA: Diagnosis not present

## 2023-11-10 DIAGNOSIS — Z8572 Personal history of non-Hodgkin lymphomas: Secondary | ICD-10-CM | POA: Diagnosis not present

## 2023-11-10 NOTE — Progress Notes (Signed)
 He had a CT scan on 10/27/2023. Patient is doing well, no new questions or concerns for the doctor today.

## 2023-11-10 NOTE — Assessment & Plan Note (Signed)
Chronic, continue monitor

## 2023-11-10 NOTE — Progress Notes (Signed)
 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  Marrow is negative on PET.  bone marrow biopsy negative.   IPI score is 4. CNS IPI - 4 S/p 6 cycle of R-Pola -CHP with GCSF - 5 cycles of IT MTX/hydrocortisone .  PET restaging 8 weeks after chemo showed CR, Deuville 1 Follow up CT scan showed no recurrence, liver lesions decreased in size, stable mildly decreased mediastinal lymphadenopathy Recommend continue clinical surveillance.  Hyponatremia Chronic, continue monitor  Port-A-Cath in place Recommend port flush Q6-8 weeks.  Discussed about option of having Mediport removed.  Patient would like to keep Mediport for now.  Orders Placed This Encounter  Procedures   CBC with Differential (Cancer Center Only)    Standing Status:   Future    Expected Date:   05/12/2024    Expiration Date:   11/09/2024   CMP (Cancer Center only)    Standing Status:   Future    Expected Date:   05/12/2024    Expiration Date:   11/09/2024   Lactate dehydrogenase    Standing Status:   Future    Expected Date:   05/12/2024    Expiration Date:   11/09/2024    Follow up 6 months  All questions were answered. The patient knows to call the clinic with any problems, questions or concerns.  Timmy Forbes, MD, PhD [facility] Hematology Oncology 11/10/2023     HISTORY OF PRESENTING ILLNESS:   03/22/2022, patient presented to emergency room for evaluation of right side lower  chest/upper abdomen/flank pain, intermittent low-grade fever and chills, night sweats and unintentional weight loss of 20 to 30 pounds over the past 6 months.  He finished a course of antibiotics a few months ago for unknown infection and symptom has not improved.  Oncology History  Diffuse large B cell lymphoma (HCC)  03/22/2022 Imaging   CT chest angiogram, CT abdomen pelvis w contrast  1. No acute pulmonary embolism. 2. There are multiple heterogeneously enhancing masses throughout the liver, several which demonstrate capsular retraction. No definitive extrahepatic primary is identified. Findings could reflect multifocal hepatocellular carcinoma/cholangiocarcinoma, lymphoma or abscess. Recommend correlation with blood chemistry values. Liver MRI with and without contrast could be of assistance in further characterizing these masses. 3. Splenomegaly.4. Multiple borderline enlarged lymph nodes above and below the diaphragm. 5. Homogeneously enlarged thyroid  gland, nonspecific. Consider further dedicated evaluation with nonemergent thyroid  ultrasound. 6. Mild bronchial wall thickening with bibasilar atelectasis. This could reflect a nonspecific bronchitis.   03/22/2022 Imaging   MRI liver w wo contrast 1. Bilobar hepatic lesions demonstrate irregular contour with minimally increased T2 signal, hypointense intrinsic T1 signal, postcontrast enhancement that is slowly increases and is most prominent in intensities 5 minutes postcontrast administration as well as a peripheral rim of reduced diffusivity.  2. Enlarged upper abdominal lymph nodes, likely reflect metastatic nodal disease involvement. 3. Enhancing lesions in the right tenth rib and L2 vertebral body,concerning for osseous metastatic disease. 4. Prominent subcentimeter pericardiophrenic and internal  mammary lymph nodes also suspicious for nodal metastatic disease.    03/31/2022 Cancer Staging   Staging form: Hodgkin and Non-Hodgkin  Lymphoma, AJCC 8th Edition - Clinical stage from 03/31/2022: Stage IV (Diffuse large B-cell lymphoma) - Signed by Timmy Forbes, MD on 04/19/2022 Stage prefix: Initial diagnosis International Prognostic Index (IPI) score: Score 4 International Prognostic Index risk group: High risk DLBCL cell of origin (COO): Activated B-cell   04/03/2022 Initial Diagnosis   Large cell lymphoma (HCC)  04/03/2022, ultrasound-guided liver biopsy showed large B-cell lymphoma.  Additional IHC is pending for subclassification as well as ancillary FISH testing for rearrangementds   04/16/2022 Imaging   PET initial staging showed 1. Multiple bulky intensely hypermetabolic liver masses throughout both liver lobes, compatible with reported lymphoma. Deauville score 5. 2. Mild splenomegaly with mild splenic hypermetabolism, compatible with lymphoma. No enlarged or hypermetabolic lymph nodes. No hypermetabolic skeletal activity. 3. Multinodular goiter with intensely hypermetabolic thyroid  nodules, also potentially due to lymphoma. 4. Chronic findings include: Aortic Atherosclerosis.Mild sigmoid diverticulosis.     04/20/2022 Procedure   Medi port placed by IR   04/22/2022 -  Chemotherapy   Patient is on Treatment Plan : NON-HODGKINS LYMPHOMA R-POLA CHP q21d     06/05/2022 Imaging   Restaging PET scan showed -PET-CT findings consistent with a complete metabolic response. No residual hypermetabolism involving the liver (Deauville 1). All measurable liver lesions are much smaller.   Mild residual FDG uptake in the thyroid  gland. This could be due to treated thyroiditis. Treated thyroid  gland involvement with lymphoma is also possible (Deauville 2).    10/13/2022 Imaging   PET scan 8 weeks after chemotherapy  showed  1. No signs of residual or recurrent FDG avid tumor within the neck,chest, abdomen or pelvis. (Deauville criteria 1). 2. Continued decrease in size of treated lesions within the liver. 3. Similar  appearance of mild diffuse increased uptake within both lobes of thyroid  gland. Consider correlation with thyroid  function tests. 4.  Aortic Atherosclerosis   07/21/2023 Imaging   CT chest abdomen pelvis w contrast showed 1. Decreased size and conspicuity of the hypodense hepatic lesions with overlying capsular retraction. 2. Increased size of prominent mediastinal and upper abdominal lymph nodes, not pathologically enlarged by size criteria, nonspecific. 3. New 4 mm right middle lobe pulmonary nodule and new peripheral left lower lobe ground-glass pulmonary nodule measuring 14 mm, nonspecific and possibly infectious/inflammatory. Consider attention on short-term interval follow-up chest CT. 4. Colonic diverticulosis without findings of acute diverticulitis. 5.  Aortic Atherosclerosis      INTERVAL HISTORY Joe Adams is a 63 y.o. male who has above history reviewed by me today presents for follow up visit for large B-cell lymphoma.   Today he reports feeling well.  Patient has lost 10 pounds since last visit in February 2025.Aaron Aas  Appetite is good.  He attributes to being more active.  Weight is about the same level comparing to July 2024.   MEDICAL HISTORY:  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   Thyroid  disease     SURGICAL HISTORY: Past Surgical History:  Procedure Laterality Date   COLONOSCOPY N/A 11/18/2016   Procedure: COLONOSCOPY IN O.R;  Surgeon: Jerlean Mood, MD;  Location: [facility] ORS;  Service: General;  Laterality: N/A;   COLONOSCOPY WITH PROPOFOL  N/A 11/18/2016   Procedure: COLONOSCOPY WITH PROPOFOL ;  Surgeon: Jerlean Mood, MD;  Location: [facility] ENDOSCOPY;  Service: Endoscopy;  Laterality: N/A;   HEMORRHOID SURGERY N/A 11/18/2016    Procedure: HEMORRHOIDECTOMY;  Surgeon: Jerlean Mood, MD;  Location: [facility] ORS;  Service: General;  Laterality: N/A;   IR IMAGING GUIDED PORT INSERTION  04/20/2022   TONSILLECTOMY     as a child    SOCIAL HISTORY: Social History   Socioeconomic History   Marital status: Divorced    Spouse name: Not on file   Number of children: Not on file   Years of education: Not on file   Highest education level: Not on file  Occupational History   Not on file  Tobacco Use   Smoking status: Never   Smokeless tobacco: Never  Vaping Use   Vaping status: Never Used  Substance and Sexual Activity   Alcohol use: Not Currently    Alcohol/week: 1.0 standard drink of alcohol    Types: 1 Shots of liquor per week    Comment: occ   Drug use: No   Sexual activity: Not on file  Other Topics Concern   Not on file  Social History Narrative   Not on file   Social Drivers of Health   Financial Resource Strain: Low Risk  (04/15/2022)   Overall Financial Resource Strain (CARDIA)    Difficulty of Paying Living Expenses: Not very hard  Recent Concern: Financial Resource Strain - Medium Risk (04/15/2022)   Overall Financial Resource Strain (CARDIA)    Difficulty of Paying Living Expenses: Somewhat hard  Food Insecurity: No Food Insecurity (04/15/2022)   Hunger Vital Sign    Worried About Running Out of Food in the Last Year: Never true    Ran Out of Food in the Last Year: Never true  Transportation Needs: No Transportation Needs (06/16/2022)   PRAPARE - Administrator, Civil Service (Medical): No    Lack of Transportation (Non-Medical): No  Physical Activity: Inactive (04/15/2022)   Exercise Vital Sign    Days of Exercise per Week: 0 days    Minutes of Exercise per Session: 0 min  Stress: Stress Concern Present (04/15/2022)   [employer] of Occupational Health - Occupational Stress Questionnaire    Feeling of Stress : To some extent  Social Connections: Moderately  Integrated (04/15/2022)   Social Connection and Isolation Panel [NHANES]    Frequency of Communication with Friends and Family: Three times a week    Frequency of Social Gatherings with Friends and Family: Twice a week    Attends Religious Services: 1 to 4 times per year    Active Member of [employer] or Organizations: No    Attends Banker Meetings: Never    Marital Status: Living with partner  Intimate Partner Violence: Not At Risk (04/15/2022)   Humiliation, Afraid, Rape, and Kick questionnaire    Fear of Current or Ex-Partner: No    Emotionally Abused: No    Physically Abused: No    Sexually Abused: No    FAMILY HISTORY: Family History  Problem Relation Age of Onset   Cancer Other    Cancer Paternal Uncle     ALLERGIES:  has no known allergies.  MEDICATIONS:  Current Outpatient Medications  Medication Sig Dispense Refill   levothyroxine (SYNTHROID) 150 MCG tablet Take 150 mcg by mouth daily.     lidocaine -prilocaine  (EMLA ) cream Apply to affected area once 30  g 3   metFORMIN (GLUCOPHAGE) 1000 MG tablet 2 (two) times daily.     Acetaminophen  (TYLENOL  EXTRA STRENGTH PO) Take 1 tablet by mouth every 6 (six) hours as needed (constipation). (Patient not taking: Reported on 07/28/2023)     Glucose Blood (TRUE METRIX BLOOD GLUCOSE TEST VI) as directed. (Patient not taking: Reported on 07/28/2023)     LANTUS SOLOSTAR 100 UNIT/ML Solostar Pen Inject into the skin. Pt is on a sliding scale; 20 units if blood glucose is at 200; takes 25 units if blood glucose on is 225 (Patient not taking: Reported on 07/28/2023)     TRUEPLUS 5-BEVEL PEN NEEDLES 31G X 6 MM MISC  (Patient not taking: Reported on 07/28/2023)     No current facility-administered medications for this visit.   Facility-Administered Medications Ordered in Other Visits  Medication Dose Route Frequency Provider Last Rate Last Admin   heparin  lock flush 100 unit/mL  500 Units Intravenous Once Timmy Forbes, MD        Review of  Systems  Constitutional:  Negative for appetite change, chills, fatigue, fever and unexpected weight change.  HENT:   Negative for voice change.   Eyes:  Negative for eye problems and icterus.  Respiratory:  Negative for chest tightness, cough and shortness of breath.   Cardiovascular:  Negative for chest pain and leg swelling.  Gastrointestinal:  Negative for abdominal distention and abdominal pain.  Endocrine: Negative for hot flashes.  Genitourinary:  Negative for difficulty urinating, dysuria and frequency.   Musculoskeletal:  Negative for arthralgias.  Skin:  Negative for itching and rash.  Neurological:  Negative for light-headedness and numbness.  Hematological:  Negative for adenopathy. Does not bruise/bleed easily.  Psychiatric/Behavioral:  Negative for confusion.    PHYSICAL EXAMINATION: ECOG PERFORMANCE STATUS: 1 - Symptomatic but completely ambulatory Vitals:   11/10/23 1435  BP: 127/87  Pulse: 90  Resp: 20  Temp: 97.6 F (36.4 C)  SpO2: 96%   Filed Weights   11/10/23 1435  Weight: 185 lb (83.9 kg)    Physical Exam Constitutional:      General: He is not in acute distress. HENT:     Head: Normocephalic and atraumatic.  Eyes:     General: No scleral icterus. Cardiovascular:     Rate and Rhythm: Normal rate.  Pulmonary:     Effort: Pulmonary effort is normal. No respiratory distress.  Abdominal:     General: There is no distension.  Musculoskeletal:        General: Normal range of motion.     Cervical back: Normal range of motion and neck supple.  Skin:    Findings: No rash.  Neurological:     Mental Status: He is alert and oriented to person, place, and time. Mental status is at baseline.     Cranial Nerves: No cranial nerve deficit.  Psychiatric:        Mood and Affect: Mood normal.     LABORATORY DATA:  I have reviewed the data as listed    Latest Ref Rng & Units 10/27/2023   11:03 AM 07/21/2023    8:41 AM 01/20/2023    2:20 PM  CBC  WBC 4.0 -  10.5 K/uL 5.9  6.2  5.5   Hemoglobin 13.0 - 17.0 g/dL 21.3  08.6  57.8   Hematocrit 39.0 - 52.0 % 39.3  42.1  38.5   Platelets 150 - 400 K/uL 157  171  147       Latest Ref  Rng & Units 10/27/2023   11:03 AM 07/21/2023    8:41 AM 01/20/2023    2:20 PM  CMP  Glucose 70 - 99 mg/dL 578  469  629   BUN 8 - 23 mg/dL 22  26  25    Creatinine 0.61 - 1.24 mg/dL 5.28  4.13  2.44   Sodium 135 - 145 mmol/L 133  132  132   Potassium 3.5 - 5.1 mmol/L 3.8  4.3  3.5   Chloride 98 - 111 mmol/L 102  100  102   CO2 22 - 32 mmol/L 20  21  22    Calcium 8.9 - 10.3 mg/dL 8.7  9.4  9.3   Total Protein 6.5 - 8.1 g/dL 7.1  7.4  7.1   Total Bilirubin 0.0 - 1.2 mg/dL 1.3  0.9  0.7   Alkaline Phos 38 - 126 U/L 85  79  71   AST 15 - 41 U/L 31  37  31   ALT 0 - 44 U/L 39  56  37       RADIOGRAPHIC STUDIES: I have personally reviewed the radiological images as listed and agreed with the findings in the report. CT CHEST ABDOMEN PELVIS W CONTRAST Result Date: 10/27/2023 CLINICAL DATA:  Diffuse large B-cell lymphoma, staging. * Tracking Code: BO * EXAM: CT CHEST, ABDOMEN, AND PELVIS WITH CONTRAST TECHNIQUE: Multidetector CT imaging of the chest, abdomen and pelvis was performed following the standard protocol during bolus administration of intravenous contrast. RADIATION DOSE REDUCTION: This exam was performed according to the departmental dose-optimization program which includes automated exposure control, adjustment of the mA and/or kV according to patient size and/or use of iterative reconstruction technique. CONTRAST:  OMNIPAQUE  IOHEXOL  300 MG/ML  SOLN COMPARISON:  Multiple priors including most recent CT July 21, 2023 FINDINGS: CT CHEST FINDINGS Cardiovascular: Accessed right chest Port-A-Cath with tip near the superior cavoatrial junction. Aortic atherosclerosis. Normal size heart. No significant pericardial effusion/thickening. Mediastinum/Nodes: No suspicious thyroid  nodule. Prominent mediastinal lymph nodes  not pathologically enlarged by size criteria are decreased in size from prior examination. For reference: -high right paratracheal lymph node measures 6 mm in short axis on image 17/2 previously 8 mm -precarinal lymph node measures 6 mm in short axis on image 23/2 previously 8 mm Lungs/Pleura: Biapical pleuroparenchymal scarring. Right middle lobe pulmonary nodule measures 4 mm on image 77/4, unchanged. Stable 14 mm peripheral left lower lobe ground-glass pulmonary nodule on image 89/4. Stable 10 mm pulmonary nodule in medial aspect of the left lower lobe on image 110/4. Musculoskeletal: No aggressive lytic or blastic lesion of bone. Multilevel degenerative changes spine. CT ABDOMEN PELVIS FINDINGS Hepatobiliary: Decreased conspicuity of the hypodense hepatic lesions with overlying capsular retraction. For reference: -segment V lesion measures 3.0 x 2.1 cm on image 60/2 previously 3.2 x 2.4 cm. -segment VIII/IV lesion measures 16 x 12 mm on image 54/2 previously 2.7 x 2.2 cm. -segment VII hepatic lesion measures 11 mm on image 48/2 previously 16 mm. Compensatory hypertrophy of the caudate and left lobe of the liver. Gallbladder is unremarkable. No biliary ductal dilation. Pancreas: Unremarkable. No pancreatic ductal dilatation or surrounding inflammatory changes. Spleen: No splenomegaly or focal splenic lesion. Adrenals/Urinary Tract: No suspicious adrenal nodule/mass. No hydronephrosis. Kidneys demonstrate symmetric enhancement. Urinary bladder is unremarkable for degree of distension. Stomach/Bowel: Stomach is unremarkable for degree of distension. No pathologic dilation of small or large bowel. Colonic diverticulosis. Vascular/Lymphatic: Aortic atherosclerosis. The portal, splenic and superior mesenteric veins are patent. Stable  prominent gastrohepatic ligament, periportal and hepatic duodenal ligament lymph nodes. For reference: -a gastrohepatic ligament lymph node measures 5 mm in short axis on image 56/2,  unchanged -portacaval lymph node measures 7 mm in short axis on image 63/2, unchanged. Reproductive: Dystrophic prostatic calcifications. Other: No significant abdominopelvic free fluid. Musculoskeletal: No aggressive lytic or blastic lesion of bone. Limbus type L5 vertebral body. Multilevel degenerative changes spine. IMPRESSION: 1. Decreased size of the hypodense hepatic lesions with overlying capsular retraction. 2. Stable prominent mediastinal and upper abdominal lymph nodes. 3. Stable bilateral pulmonary nodules. 4. No new or progressive disease in the chest, abdomen or pelvis. Electronically Signed   By: Tama Fails M.D.   On: 10/27/2023 15:45

## 2023-11-10 NOTE — Assessment & Plan Note (Signed)
 Recommend port flush Q6-8 weeks.  Discussed about option of having Mediport removed.  Patient would like to keep Mediport for now.

## 2023-11-10 NOTE — Assessment & Plan Note (Addendum)
 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  Marrow is negative on PET.  bone marrow biopsy negative.   IPI score is 4. CNS IPI - 4 S/p 6 cycle of R-Pola -CHP with GCSF - 5 cycles of IT MTX/hydrocortisone .  PET restaging 8 weeks after chemo showed CR, Deuville 1 Follow up CT scan showed no recurrence, liver lesions decreased in size, stable mildly decreased mediastinal lymphadenopathy Recommend continue clinical surveillance.

## 2023-11-11 ENCOUNTER — Other Ambulatory Visit: Payer: Self-pay

## 2023-12-22 ENCOUNTER — Inpatient Hospital Stay: Attending: Oncology

## 2023-12-22 DIAGNOSIS — Z8572 Personal history of non-Hodgkin lymphomas: Secondary | ICD-10-CM | POA: Diagnosis present

## 2023-12-22 DIAGNOSIS — R634 Abnormal weight loss: Secondary | ICD-10-CM | POA: Diagnosis not present

## 2023-12-22 DIAGNOSIS — Z95828 Presence of other vascular implants and grafts: Secondary | ICD-10-CM

## 2023-12-22 MED ORDER — SODIUM CHLORIDE 0.9% FLUSH
10.0000 mL | INTRAVENOUS | Status: DC | PRN
  Administered 2023-12-22: 10 mL via INTRAVENOUS
  Filled 2023-12-22: qty 10

## 2023-12-22 MED ORDER — HEPARIN SOD (PORK) LOCK FLUSH 100 UNIT/ML IV SOLN
500.0000 [IU] | Freq: Once | INTRAVENOUS | Status: AC
  Administered 2023-12-22: 500 [IU] via INTRAVENOUS
  Filled 2023-12-22: qty 5

## 2023-12-31 ENCOUNTER — Telehealth: Payer: Self-pay | Admitting: *Deleted

## 2023-12-31 NOTE — Telephone Encounter (Signed)
 Last time that the patient came in to see Dr. Babara he weighed 185 pounds and she said that if he keeps losing pounds to let her know.  The patient was last seen on May 21 and as of today he is 170 pounds

## 2024-01-03 ENCOUNTER — Encounter: Payer: Self-pay | Admitting: Oncology

## 2024-01-03 NOTE — Telephone Encounter (Signed)
 Please schedule MD/ Nutrition one day next week. Please inform pt of appt details.

## 2024-01-14 ENCOUNTER — Encounter: Payer: Self-pay | Admitting: Oncology

## 2024-01-14 ENCOUNTER — Inpatient Hospital Stay

## 2024-01-14 ENCOUNTER — Inpatient Hospital Stay (HOSPITAL_BASED_OUTPATIENT_CLINIC_OR_DEPARTMENT_OTHER): Admitting: Oncology

## 2024-01-14 VITALS — BP 120/86 | HR 79 | Temp 97.2°F | Resp 16 | Wt 175.0 lb

## 2024-01-14 DIAGNOSIS — R634 Abnormal weight loss: Secondary | ICD-10-CM

## 2024-01-14 DIAGNOSIS — C8338 Diffuse large B-cell lymphoma, lymph nodes of multiple sites: Secondary | ICD-10-CM

## 2024-01-14 DIAGNOSIS — Z8572 Personal history of non-Hodgkin lymphomas: Secondary | ICD-10-CM | POA: Diagnosis not present

## 2024-01-14 DIAGNOSIS — Z95828 Presence of other vascular implants and grafts: Secondary | ICD-10-CM

## 2024-01-14 LAB — CBC WITH DIFFERENTIAL/PLATELET
Abs Immature Granulocytes: 0.1 K/uL — ABNORMAL HIGH (ref 0.00–0.07)
Basophils Absolute: 0.1 K/uL (ref 0.0–0.1)
Basophils Relative: 1 %
Eosinophils Absolute: 0.1 K/uL (ref 0.0–0.5)
Eosinophils Relative: 2 %
HCT: 42.9 % (ref 39.0–52.0)
Hemoglobin: 15.2 g/dL (ref 13.0–17.0)
Immature Granulocytes: 2 %
Lymphocytes Relative: 21 %
Lymphs Abs: 1.3 K/uL (ref 0.7–4.0)
MCH: 30.2 pg (ref 26.0–34.0)
MCHC: 35.4 g/dL (ref 30.0–36.0)
MCV: 85.3 fL (ref 80.0–100.0)
Monocytes Absolute: 0.6 K/uL (ref 0.1–1.0)
Monocytes Relative: 9 %
Neutro Abs: 4.2 K/uL (ref 1.7–7.7)
Neutrophils Relative %: 65 %
Platelets: 185 K/uL (ref 150–400)
RBC: 5.03 MIL/uL (ref 4.22–5.81)
RDW: 12.7 % (ref 11.5–15.5)
WBC: 6.4 K/uL (ref 4.0–10.5)
nRBC: 0 % (ref 0.0–0.2)

## 2024-01-14 LAB — COMPREHENSIVE METABOLIC PANEL WITH GFR
ALT: 31 U/L (ref 0–44)
AST: 22 U/L (ref 15–41)
Albumin: 4.5 g/dL (ref 3.5–5.0)
Alkaline Phosphatase: 143 U/L — ABNORMAL HIGH (ref 38–126)
Anion gap: 10 (ref 5–15)
BUN: 18 mg/dL (ref 8–23)
CO2: 23 mmol/L (ref 22–32)
Calcium: 9.2 mg/dL (ref 8.9–10.3)
Chloride: 96 mmol/L — ABNORMAL LOW (ref 98–111)
Creatinine, Ser: 0.97 mg/dL (ref 0.61–1.24)
GFR, Estimated: >60 mL/min (ref >60)
Glucose, Bld: 407 mg/dL — ABNORMAL HIGH (ref 70–99)
Potassium: 4.5 mmol/L (ref 3.5–5.1)
Sodium: 129 mmol/L — ABNORMAL LOW (ref 135–145)
Total Bilirubin: 1 mg/dL (ref 0.0–1.2)
Total Protein: 6.8 g/dL (ref 6.5–8.1)

## 2024-01-14 LAB — LACTATE DEHYDROGENASE: LDH: 123 U/L (ref 98–192)

## 2024-01-14 LAB — TSH: TSH: 2.832 u[IU]/mL (ref 0.350–4.500)

## 2024-01-14 NOTE — Assessment & Plan Note (Signed)
 Recommend port flush Q6-8 weeks.

## 2024-01-14 NOTE — Progress Notes (Signed)
 Nutrition Follow-up:  Referred back to RD due to weight loss.  Patient with large B-cell lymphoma completed chemotherapy.  Currently under surveillance.    Met with patient today in clinic.  Reports good appetite.  I eat all the time.  Yesterday skipped breakfast because wanted to get work done before heat of the day.  Lunch had some soup.  Later on ate 2 chicken strips and ice cream cone. Then had canned tuna with mayo and crackers.  Ate another chicken strip and pinto beans.  Does not drink oral nutrition supplement shakes. Does not check blood glucose on regular basis.    Medications: metformin  Labs: pending for today  Anthropometrics:   Weight 175 lb today  185 lb on 5/21 195 lb on 07/28/23 183 lb 14.4 oz on 01/20/23  5% weight loss in the last 2 months, concerning 10% weight loss in the last 5 months  Estimated Energy Needs  Kcals: 2100-2500 Protein: 81-97 g Fluid: >  NUTRITION DIAGNOSIS: Unintentional weight loss related to ? As evidenced by 5% weight loss in the last 2 months    INTERVENTION:  Discussed ways to increase calories and protein in diet. Handout given Recommend 3 meals a  day plus 2-3 snacks a day.  Samples of glucerna and ensure max protein given for patient to try along with coupons.  Low sugar shake options written for patient    MONITORING, EVALUATION, GOAL: weight trends, intake   NEXT VISIT: as needed  Swan [place] B. Dasie SOLON, CSO, LDN Registered Dietitian [phone]

## 2024-01-14 NOTE — Assessment & Plan Note (Signed)
 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  Marrow is negative on PET.  bone marrow biopsy negative.   IPI score is 4. CNS IPI - 4 S/p 6 cycle of R-Pola -CHP with GCSF - 5 cycles of IT MTX/hydrocortisone .  PET restaging 8 weeks after chemo showed CR, [place] 1 Follow up CT scan showed no recurrence, liver lesions decreased in size, stable mildly decreased mediastinal lymphadenopathy Recommend continue clinical surveillance. - if no other etiologies found for unintentional weight loss, consider repeat CT scan.

## 2024-01-14 NOTE — Progress Notes (Signed)
 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  Marrow is negative on PET.  bone marrow biopsy negative.   IPI score is 4. CNS IPI - 4 S/p 6 cycle of R-Pola -CHP with GCSF - 5 cycles of IT MTX/hydrocortisone .  PET restaging 8 weeks after chemo showed CR, [place] 1 Follow up CT scan showed no recurrence, liver lesions decreased in size, stable mildly decreased mediastinal lymphadenopathy Recommend continue clinical surveillance. - if no other etiologies found for unintentional weight loss, consider repeat CT scan.   Unintentional weight loss Check cbc, cmp LDH, flowcytometry, TSH, A1c.   Port-A-Cath in place Recommend port flush Q6-8 weeks.   Orders Placed This Encounter  Procedures   CBC with Differential/Platelet    Standing Status:   Future    Number of Occurrences:   1    Expected Date:   01/14/2024    Expiration Date:   04/13/2024   Comprehensive metabolic panel with GFR    Standing Status:   Future    Number of Occurrences:   1    Expected Date:   01/14/2024    Expiration Date:   04/13/2024   Flow cytometry panel-leukemia/lymphoma work-up    Standing Status:   Future    Number of Occurrences:   1    Expected Date:   01/14/2024    Expiration Date:   04/13/2024   Lactate dehydrogenase    Standing Status:   Future    Number of Occurrences:   1    Expected Date:   01/14/2024    Expiration Date:   04/13/2024   TSH    Standing Status:    Future    Number of Occurrences:   1    Expected Date:   01/14/2024    Expiration Date:   04/13/2024   T4    Standing Status:   Future    Number of Occurrences:   1    Expected Date:   01/14/2024    Expiration Date:   04/13/2024   Hemoglobin A1c    Standing Status:   Future    Number of Occurrences:   1    Expected Date:   01/14/2024    Expiration Date:   01/13/2025    Follow up 6 months  All questions were answered. The patient knows to call the clinic with any problems, questions or concerns.  Call Babara, MD, PhD [facility] Hematology Oncology 01/14/2024     HISTORY OF PRESENTING ILLNESS:   03/22/2022, patient presented to emergency room for evaluation of right side lower chest/upper abdomen/flank pain, intermittent low-grade fever and chills, night sweats and unintentional weight loss of 20 to 30 pounds over the past 6 months.  He finished a course of antibiotics a few months ago for unknown infection and symptom has not improved.  Oncology History  Diffuse large B cell lymphoma (HCC)  03/22/2022 Imaging   CT chest angiogram, CT abdomen pelvis w contrast  1. No acute pulmonary  embolism. 2. There are multiple heterogeneously enhancing masses throughout the liver, several which demonstrate capsular retraction. No definitive extrahepatic primary is identified. Findings could reflect multifocal hepatocellular carcinoma/cholangiocarcinoma, lymphoma or abscess. Recommend correlation with blood chemistry values. Liver MRI with and without contrast could be of assistance in further characterizing these masses. 3. Splenomegaly.4. Multiple borderline enlarged lymph nodes above and below the diaphragm. 5. Homogeneously enlarged thyroid  gland, nonspecific. Consider further dedicated evaluation with nonemergent thyroid  ultrasound. 6. Mild bronchial wall thickening with bibasilar atelectasis. This could reflect a nonspecific bronchitis.   03/22/2022 Imaging   MRI liver w wo contrast 1. Bilobar  hepatic lesions demonstrate irregular contour with minimally increased T2 signal, hypointense intrinsic T1 signal, postcontrast enhancement that is slowly increases and is most prominent in intensities 5 minutes postcontrast administration as well as a peripheral rim of reduced diffusivity.  2. Enlarged upper abdominal lymph nodes, likely reflect metastatic nodal disease involvement. 3. Enhancing lesions in the right tenth rib and L2 vertebral body,concerning for osseous metastatic disease. 4. Prominent subcentimeter pericardiophrenic and internal mammary lymph nodes also suspicious for nodal metastatic disease.    03/31/2022 Cancer Staging   Staging form: Hodgkin and Non-Hodgkin Lymphoma, AJCC 8th Edition - Clinical stage from 03/31/2022: Stage IV (Diffuse large B-cell lymphoma) - Signed by Babara Call, MD on 04/19/2022 Stage prefix: Initial diagnosis International Prognostic Index (IPI) score: Score 4 International Prognostic Index risk group: High risk DLBCL cell of origin (COO): Activated B-cell   04/03/2022 Initial Diagnosis   Large cell lymphoma (HCC)  04/03/2022, ultrasound-guided liver biopsy showed large B-cell lymphoma.  Additional IHC is pending for subclassification as well as ancillary FISH testing for rearrangementds   04/16/2022 Imaging   PET initial staging showed 1. Multiple bulky intensely hypermetabolic liver masses throughout both liver lobes, compatible with reported lymphoma. Deauville score 5. 2. Mild splenomegaly with mild splenic hypermetabolism, compatible with lymphoma. No enlarged or hypermetabolic lymph nodes. No hypermetabolic skeletal activity. 3. Multinodular goiter with intensely hypermetabolic thyroid  nodules, also potentially due to lymphoma. 4. Chronic findings include: Aortic Atherosclerosis.Mild sigmoid diverticulosis.     04/20/2022 Procedure   Medi port placed by IR   04/22/2022 -  Chemotherapy   Patient is on Treatment Plan : NON-HODGKINS LYMPHOMA  R-POLA CHP q21d     06/05/2022 Imaging   Restaging PET scan showed -PET-CT findings consistent with a complete metabolic response. No residual hypermetabolism involving the liver (Deauville 1). All measurable liver lesions are much smaller.   Mild residual FDG uptake in the thyroid  gland. This could be due to treated thyroiditis. Treated thyroid  gland involvement with lymphoma is also possible (Deauville 2).    10/13/2022 Imaging   PET scan 8 weeks after chemotherapy  showed  1. No signs of residual or recurrent FDG avid tumor within the neck,chest, abdomen or pelvis. (Deauville criteria 1). 2. Continued decrease in size of treated lesions within the liver. 3. Similar appearance of mild diffuse increased uptake within both lobes of thyroid  gland. Consider correlation with thyroid  function tests. 4.  Aortic Atherosclerosis   07/21/2023 Imaging   CT chest abdomen pelvis w contrast showed 1. Decreased size and conspicuity of the hypodense hepatic lesions with overlying capsular retraction. 2. Increased size of prominent mediastinal and upper abdominal lymph nodes, not pathologically enlarged by size criteria, nonspecific. 3. New 4 mm right middle lobe pulmonary nodule and new peripheral left lower lobe ground-glass pulmonary nodule measuring 14 mm, nonspecific and possibly infectious/inflammatory. Consider attention on short-term interval follow-up chest CT. 4. Colonic  diverticulosis without findings of acute diverticulitis. 5.  Aortic Atherosclerosis      INTERVAL HISTORY DIARRA KOS is a 63 y.o. male who has above history reviewed by me today presents for follow up visit for large B-cell lymphoma.   Today he reports feeling well.  Patient has lost 10 pounds since last visit in February 2025.SABRA  Appetite is good.  He attributes to being more active.  Weight is about the same level comparing to July 2024.   MEDICAL HISTORY:  Past Medical History:  Diagnosis Date   Acute MI (HCC)  2005   Diabetes mellitus without complication (HCC)    GERD (gastroesophageal reflux disease)    Headache    Hernia, umbilical    History of kidney stones    Large cell lymphoma (HCC) 04/03/2022   Thyroid  disease     SURGICAL HISTORY: Past Surgical History:  Procedure Laterality Date   COLONOSCOPY N/A 11/18/2016   Procedure: COLONOSCOPY IN O.R;  Surgeon: Dellie Louanne MATSU, MD;  Location: [facility] ORS;  Service: General;  Laterality: N/A;   COLONOSCOPY WITH PROPOFOL  N/A 11/18/2016   Procedure: COLONOSCOPY WITH PROPOFOL ;  Surgeon: Dellie Louanne MATSU, MD;  Location: [facility] END[facility]COPY;  Service: Endoscopy;  Laterality: N/A;   HEMORRHOID SURGERY N/A 11/18/2016   Procedure: HEMORRHOIDECTOMY;  Surgeon: Dellie Louanne MATSU, MD;  Location: [facility] ORS;  Service: General;  Laterality: N/A;   IR IMAGING GUIDED PORT INSERTION  04/20/2022   TONSILLECTOMY     as a child    SOCIAL HISTORY: Social History   Socioeconomic History   Marital status: Divorced    Spouse name: Not on file   Number of children: Not on file   Years of education: Not on file   Highest education level: Not on file  Occupational History   Not on file  Tobacco Use   Smoking status: Never   Smokeless tobacco: Never  Vaping Use   Vaping status: Never Used  Substance and Sexual Activity   Alcohol use: Not Currently    Alcohol/week: 1.0 standard drink of alcohol    Types: 1 Shots of liquor per week    Comment: occ   Drug use: No   Sexual activity: Not on file  Other Topics Concern   Not on file  Social History Narrative   Not on file   Social Drivers of Health   Financial Resource Strain: Low Risk  (04/15/2022)   Overall Financial Resource Strain (CARDIA)    Difficulty of Paying Living Expenses: Not very hard  Recent Concern: Financial Resource Strain - Medium Risk (04/15/2022)   Overall Financial Resource Strain (CARDIA)    Difficulty of Paying Living Expenses: Somewhat hard  Food Insecurity: No Food  Insecurity (04/15/2022)   Hunger Vital Sign    Worried About Running Out of Food in the Last Year: Never true    Ran Out of Food in the Last Year: Never true  Transportation Needs: No Transportation Needs (06/16/2022)   PRAPARE - Administrator, Civil Service (Medical): No    Lack of Transportation (Non-Medical): No  Physical Activity: Inactive (04/15/2022)   Exercise Vital Sign    Days of Exercise per Week: 0 days    Minutes of Exercise per Session: 0 min  Stress: Stress Concern Present (04/15/2022)   [employer] of Occupational Health - Occupational Stress Questionnaire    Feeling of Stress : To some extent  Social Connections: Moderately Integrated (04/15/2022)   Social Connection and Isolation Panel  Frequency of Communication with Friends and Family: Three times a week    Frequency of Social Gatherings with Friends and Family: Twice a week    Attends Religious Services: 1 to 4 times per year    Active Member of [employer] or Organizations: No    Attends Banker Meetings: Never    Marital Status: Living with partner  Intimate Partner Violence: Not At Risk (04/15/2022)   Humiliation, Afraid, Rape, and Kick questionnaire    Fear of Current or Ex-Partner: No    Emotionally Abused: No    Physically Abused: No    Sexually Abused: No    FAMILY HISTORY: Family History  Problem Relation Age of Onset   Cancer Other    Cancer Paternal Uncle     ALLERGIES:  has no known allergies.  MEDICATIONS:  Current Outpatient Medications  Medication Sig Dispense Refill   levothyroxine (SYNTHROID) 150 MCG tablet Take 150 mcg by mouth daily.     lidocaine -prilocaine  (EMLA ) cream Apply to affected area once 30 g 3   metFORMIN (GLUCOPHAGE) 1000 MG tablet 2 (two) times daily.     Acetaminophen  (TYLENOL  EXTRA STRENGTH PO) Take 1 tablet by mouth every 6 (six) hours as needed (constipation). (Patient not taking: Reported on 01/14/2024)     Glucose Blood (TRUE METRIX  BLOOD GLUCOSE TEST VI) as directed. (Patient not taking: Reported on 01/14/2024)     LANTUS SOLOSTAR 100 UNIT/ML Solostar Pen Inject into the skin. Pt is on a sliding scale; 20 units if blood glucose is at 200; takes 25 units if blood glucose on is 225 (Patient not taking: Reported on 01/14/2024)     TRUEPLUS 5-BEVEL PEN NEEDLES 31G X 6 MM MISC  (Patient not taking: Reported on 01/14/2024)     No current facility-administered medications for this visit.   Facility-Administered Medications Ordered in Other Visits  Medication Dose Route Frequency Provider Last Rate Last Admin   heparin  lock flush 100 unit/mL  500 Units Intravenous Once Marites Nath, MD       sodium chloride  flush (NS) 0.9 % injection 10 mL  10 mL Intravenous PRN Babara Call, MD   10 mL at 12/22/23 1518    Review of Systems  Constitutional:  Negative for appetite change, chills, fatigue, fever and unexpected weight change.  HENT:   Negative for voice change.   Eyes:  Negative for eye problems and icterus.  Respiratory:  Negative for chest tightness, cough and shortness of breath.   Cardiovascular:  Negative for chest pain and leg swelling.  Gastrointestinal:  Negative for abdominal distention and abdominal pain.  Endocrine: Negative for hot flashes.  Genitourinary:  Negative for difficulty urinating, dysuria and frequency.   Musculoskeletal:  Negative for arthralgias.  Skin:  Negative for itching and rash.  Neurological:  Negative for light-headedness and numbness.  Hematological:  Negative for adenopathy. Does not bruise/bleed easily.  Psychiatric/Behavioral:  Negative for confusion.    PHYSICAL EXAMINATION: ECOG PERFORMANCE STATUS: 1 - Symptomatic but completely ambulatory Vitals:   01/14/24 0842  BP: 120/86  Pulse: 79  Resp: 16  Temp: (!) 97.2 F (36.2 C)  SpO2: 100%   Filed Weights   01/14/24 0842  Weight: 175 lb (79.4 kg)    Physical Exam Constitutional:      General: He is not in acute distress. HENT:      Head: Normocephalic and atraumatic.  Eyes:     General: No scleral icterus. Cardiovascular:     Rate and Rhythm: Normal  rate.  Pulmonary:     Effort: Pulmonary effort is normal. No respiratory distress.  Abdominal:     General: There is no distension.  Musculoskeletal:        General: Normal range of motion.     Cervical back: Normal range of motion and neck supple.  Skin:    Findings: No rash.  Neurological:     Mental Status: He is alert and oriented to person, place, and time. Mental status is at baseline.     Cranial Nerves: No cranial nerve deficit.  Psychiatric:        Mood and Affect: Mood normal.     LABORATORY DATA:  I have reviewed the data as listed    Latest Ref Rng & Units 10/27/2023   11:03 AM 07/21/2023    8:41 AM 01/20/2023    2:20 PM  CBC  WBC 4.0 - 10.5 K/uL 5.9  6.2  5.5   Hemoglobin 13.0 - 17.0 g/dL 85.7  85.0  86.0   Hematocrit 39.0 - 52.0 % 39.3  42.1  38.5   Platelets 150 - 400 K/uL 157  171  147       Latest Ref Rng & Units 10/27/2023   11:03 AM 07/21/2023    8:41 AM 01/20/2023    2:20 PM  CMP  Glucose 70 - 99 mg/dL 685  811  857   BUN 8 - 23 mg/dL 22  26  25    Creatinine 0.61 - 1.24 mg/dL 9.02  9.10  9.24   Sodium 135 - 145 mmol/L 133  132  132   Potassium 3.5 - 5.1 mmol/L 3.8  4.3  3.5   Chloride 98 - 111 mmol/L 102  100  102   CO2 22 - 32 mmol/L 20  21  22    Calcium 8.9 - 10.3 mg/dL 8.7  9.4  9.3   Total Protein 6.5 - 8.1 g/dL 7.1  7.4  7.1   Total Bilirubin 0.0 - 1.2 mg/dL 1.3  0.9  0.7   Alkaline Phos 38 - 126 U/L 85  79  71   AST 15 - 41 U/L 31  37  31   ALT 0 - 44 U/L 39  56  37       RADIOGRAPHIC STUDIES: I have personally reviewed the radiological images as listed and agreed with the findings in the report. CT CHEST ABDOMEN PELVIS W CONTRAST Result Date: 10/27/2023 CLINICAL DATA:  Diffuse large B-cell lymphoma, staging. * Tracking Code: BO * EXAM: CT CHEST, ABDOMEN, AND PELVIS WITH CONTRAST TECHNIQUE: Multidetector CT imaging of the  chest, abdomen and pelvis was performed following the standard protocol during bolus administration of intravenous contrast. RADIATION DOSE REDUCTION: This exam was performed according to the departmental dose-optimization program which includes automated exposure control, adjustment of the mA and/or kV according to patient size and/or use of iterative reconstruction technique. CONTRAST:  OMNIPAQUE  IOHEXOL  300 MG/ML  SOLN COMPARISON:  Multiple priors including most recent CT July 21, 2023 FINDINGS: CT CHEST FINDINGS Cardiovascular: Accessed right chest Port-A-Cath with tip near the superior cavoatrial junction. Aortic atherosclerosis. Normal size heart. No significant pericardial effusion/thickening. Mediastinum/Nodes: No suspicious thyroid  nodule. Prominent mediastinal lymph nodes not pathologically enlarged by size criteria are decreased in size from prior examination. For reference: -high right paratracheal lymph node measures 6 mm in short axis on image 17/2 previously 8 mm -precarinal lymph node measures 6 mm in short axis on image 23/2 previously 8 mm Lungs/Pleura: Biapical pleuroparenchymal  scarring. Right middle lobe pulmonary nodule measures 4 mm on image 77/4, unchanged. Stable 14 mm peripheral left lower lobe ground-glass pulmonary nodule on image 89/4. Stable 10 mm pulmonary nodule in medial aspect of the left lower lobe on image 110/4. Musculoskeletal: No aggressive lytic or blastic lesion of bone. Multilevel degenerative changes spine. CT ABDOMEN PELVIS FINDINGS Hepatobiliary: Decreased conspicuity of the hypodense hepatic lesions with overlying capsular retraction. For reference: -segment V lesion measures 3.0 x 2.1 cm on image 60/2 previously 3.2 x 2.4 cm. -segment VIII/IV lesion measures 16 x 12 mm on image 54/2 previously 2.7 x 2.2 cm. -segment VII hepatic lesion measures 11 mm on image 48/2 previously 16 mm. Compensatory hypertrophy of the caudate and left lobe of the liver. Gallbladder  is unremarkable. No biliary ductal dilation. Pancreas: Unremarkable. No pancreatic ductal dilatation or surrounding inflammatory changes. Spleen: No splenomegaly or focal splenic lesion. Adrenals/Urinary Tract: No suspicious adrenal nodule/mass. No hydronephrosis. Kidneys demonstrate symmetric enhancement. Urinary bladder is unremarkable for degree of distension. Stomach/Bowel: Stomach is unremarkable for degree of distension. No pathologic dilation of small or large bowel. Colonic diverticulosis. Vascular/Lymphatic: Aortic atherosclerosis. The portal, splenic and superior mesenteric veins are patent. Stable prominent gastrohepatic ligament, periportal and hepatic duodenal ligament lymph nodes. For reference: -a gastrohepatic ligament lymph node measures 5 mm in short axis on image 56/2, unchanged -portacaval lymph node measures 7 mm in short axis on image 63/2, unchanged. Reproductive: Dystrophic prostatic calcifications. Other: No significant abdominopelvic free fluid. Musculoskeletal: No aggressive lytic or blastic lesion of bone. Limbus type L5 vertebral body. Multilevel degenerative changes spine. IMPRESSION: 1. Decreased size of the hypodense hepatic lesions with overlying capsular retraction. 2. Stable prominent mediastinal and upper abdominal lymph nodes. 3. Stable bilateral pulmonary nodules. 4. No new or progressive disease in the chest, abdomen or pelvis. Electronically Signed   By: Reyes Holder M.D.   On: 10/27/2023 15:45

## 2024-01-14 NOTE — Assessment & Plan Note (Signed)
 Check cbc, cmp LDH, flowcytometry, TSH, A1c.

## 2024-01-15 LAB — T4: T4, Total: 9.3 ug/dL (ref 4.5–12.0)

## 2024-01-18 LAB — COMP PANEL: LEUKEMIA/LYMPHOMA

## 2024-01-19 ENCOUNTER — Telehealth: Payer: Self-pay | Admitting: *Deleted

## 2024-01-19 LAB — HEMOGLOBIN A1C
Hgb A1c MFr Bld: 12.4 % — ABNORMAL HIGH (ref 4.8–5.6)
Mean Plasma Glucose: 309.18 mg/dL

## 2024-01-19 NOTE — Telephone Encounter (Signed)
 Patient requesting blood work results from 01/14/24.

## 2024-01-20 ENCOUNTER — Other Ambulatory Visit: Payer: Self-pay

## 2024-01-20 ENCOUNTER — Encounter: Payer: Self-pay | Admitting: Oncology

## 2024-01-20 DIAGNOSIS — C8338 Diffuse large B-cell lymphoma, lymph nodes of multiple sites: Secondary | ICD-10-CM

## 2024-01-20 NOTE — Telephone Encounter (Signed)
 Dr. Babara has reviewed labs. Per Dr. Babara: the blood work is not pointing to cancer recurrence and other lab work is stable. HgbA1c is 12.4 and she suspects weight loss is due to poorly controlled diabetes. Pt to follow up with PCP for further diabetes management. She will obtain a CT scan in August to follow up on lymphoma. Pt informed of this and verbalized understanding. He will contact PCP to set up appt. Labs and OVN faxed to [place] clinic.   Please schedule and notify pt of appt details:   -CT CAP in August -Pt has port flush on 8/13, please adjust to be PORT FLUSH w /LAB/ MD- approx 1 week after CT

## 2024-01-26 ENCOUNTER — Ambulatory Visit
Admission: RE | Admit: 2024-01-26 | Discharge: 2024-01-26 | Disposition: A | Discharge status: Home / Self Care | Source: Ambulatory Visit | Attending: Oncology | Admitting: Oncology

## 2024-01-26 DIAGNOSIS — C8338 Diffuse large B-cell lymphoma, lymph nodes of multiple sites: Secondary | ICD-10-CM | POA: Diagnosis present

## 2024-01-26 MED ORDER — IOHEXOL 300 MG/ML  SOLN
100.0000 mL | Freq: Once | INTRAMUSCULAR | Status: AC | PRN
  Administered 2024-01-26: 100 mL via INTRAVENOUS

## 2024-02-02 ENCOUNTER — Other Ambulatory Visit

## 2024-02-04 ENCOUNTER — Inpatient Hospital Stay: Attending: Oncology | Admitting: Oncology

## 2024-02-04 ENCOUNTER — Inpatient Hospital Stay

## 2024-02-04 ENCOUNTER — Encounter

## 2024-02-04 ENCOUNTER — Encounter: Payer: Self-pay | Admitting: Oncology

## 2024-02-04 VITALS — BP 120/88 | HR 80 | Temp 97.1°F | Resp 18 | Wt 178.8 lb

## 2024-02-04 DIAGNOSIS — R634 Abnormal weight loss: Secondary | ICD-10-CM | POA: Insufficient documentation

## 2024-02-04 DIAGNOSIS — E871 Hypo-osmolality and hyponatremia: Secondary | ICD-10-CM | POA: Insufficient documentation

## 2024-02-04 DIAGNOSIS — C8338 Diffuse large B-cell lymphoma, lymph nodes of multiple sites: Secondary | ICD-10-CM

## 2024-02-04 DIAGNOSIS — Z95828 Presence of other vascular implants and grafts: Secondary | ICD-10-CM

## 2024-02-04 DIAGNOSIS — C83398 Diffuse large b-cell lymphoma of other extranodal and solid organ sites: Secondary | ICD-10-CM | POA: Diagnosis present

## 2024-02-04 DIAGNOSIS — Z809 Family history of malignant neoplasm, unspecified: Secondary | ICD-10-CM | POA: Insufficient documentation

## 2024-02-04 DIAGNOSIS — E1165 Type 2 diabetes mellitus with hyperglycemia: Secondary | ICD-10-CM | POA: Diagnosis not present

## 2024-02-04 LAB — CBC WITH DIFFERENTIAL (CANCER CENTER ONLY)
Abs Immature Granulocytes: 0.03 K/uL (ref 0.00–0.07)
Basophils Absolute: 0.1 K/uL (ref 0.0–0.1)
Basophils Relative: 1 %
Eosinophils Absolute: 0.2 K/uL (ref 0.0–0.5)
Eosinophils Relative: 2 %
HCT: 41.5 % (ref 39.0–52.0)
Hemoglobin: 14.6 g/dL (ref 13.0–17.0)
Immature Granulocytes: 0 %
Lymphocytes Relative: 23 %
Lymphs Abs: 1.6 K/uL (ref 0.7–4.0)
MCH: 30.2 pg (ref 26.0–34.0)
MCHC: 35.2 g/dL (ref 30.0–36.0)
MCV: 85.9 fL (ref 80.0–100.0)
Monocytes Absolute: 0.9 K/uL (ref 0.1–1.0)
Monocytes Relative: 13 %
Neutro Abs: 4.1 K/uL (ref 1.7–7.7)
Neutrophils Relative %: 61 %
Platelet Count: 164 K/uL (ref 150–400)
RBC: 4.83 MIL/uL (ref 4.22–5.81)
RDW: 13 % (ref 11.5–15.5)
WBC Count: 6.8 K/uL (ref 4.0–10.5)
nRBC: 0 % (ref 0.0–0.2)

## 2024-02-04 LAB — CMP (CANCER CENTER ONLY)
ALT: 30 U/L (ref 0–44)
AST: 28 U/L (ref 15–41)
Albumin: 4.3 g/dL (ref 3.5–5.0)
Alkaline Phosphatase: 95 U/L (ref 38–126)
Anion gap: 11 (ref 5–15)
BUN: 29 mg/dL — ABNORMAL HIGH (ref 8–23)
CO2: 21 mmol/L — ABNORMAL LOW (ref 22–32)
Calcium: 9.6 mg/dL (ref 8.9–10.3)
Chloride: 100 mmol/L (ref 98–111)
Creatinine: 1.02 mg/dL (ref 0.61–1.24)
GFR, Estimated: >60 mL/min (ref >60)
Glucose, Bld: 261 mg/dL — ABNORMAL HIGH (ref 70–99)
Potassium: 3.7 mmol/L (ref 3.5–5.1)
Sodium: 132 mmol/L — ABNORMAL LOW (ref 135–145)
Total Bilirubin: 0.9 mg/dL (ref 0.0–1.2)
Total Protein: 7 g/dL (ref 6.5–8.1)

## 2024-02-04 LAB — LACTATE DEHYDROGENASE: LDH: 114 U/L (ref 98–192)

## 2024-02-04 NOTE — Assessment & Plan Note (Addendum)
 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  Activated ABC type DLBCL - liver, spleen, possible  thyroid,  involvements.  Marrow is negative on PET.  bone marrow biopsy negative.   IPI score is 4. CNS IPI - 4 S/p 6 cycle of R-Pola -CHP with GCSF - 5 cycles of IT MTX/hydrocortisone.  PET restaging 8 weeks after chemo showed CR, [place] 21 January 2024, CT scan showed slight increase of one of his liver lesions. peripheral right lobe segment V/VIII lesion 3.5 x 2.2 cm,previously 3.0 x 2.1 cm I will obtain PET scan for reevaluation.

## 2024-02-04 NOTE — Patient Instructions (Signed)

## 2024-02-04 NOTE — Progress Notes (Signed)
 Nutrition  Patient requested to cancel nutrition follow-up for today.    RD available as needed  Markail Diekman B. Dasie SOLON, CSO, LDN Registered Dietitian [phone]

## 2024-02-05 ENCOUNTER — Encounter: Payer: Self-pay | Admitting: Oncology

## 2024-02-05 DIAGNOSIS — E119 Type 2 diabetes mellitus without complications: Secondary | ICD-10-CM | POA: Insufficient documentation

## 2024-02-05 NOTE — Assessment & Plan Note (Signed)
 Possibly secondary to uncontrolled diabetes. Obtain PET scan to rule out lymphoma recurrence.

## 2024-02-05 NOTE — Assessment & Plan Note (Signed)
 Recommend port flush Q6-8 weeks.

## 2024-02-05 NOTE — Assessment & Plan Note (Signed)
Chronic, continue monitor

## 2024-02-05 NOTE — Progress Notes (Signed)
 Hematology/Oncology Progress note [phone] [phone]      CHIEF COMPLAINTS/REASON FOR VISIT:  Stage IV DLBCL   ASSESSMENT & PLAN:   Cancer Staging  Diffuse large B cell lymphoma (HCC) Staging form: Hodgkin and Non-Hodgkin Lymphoma, AJCC 8th Edition - Clinical stage from 03/31/2022: Stage IV (Diffuse large B-cell lymphoma) - Signed by Babara Call, MD on 04/19/2022   Diffuse large B cell lymphoma (HCC) BCL6 cmyc IHC positive, FISH negative for BCL2, BCL6, and CMYC rearrangement.  Activated ABC type DLBCL - liver, spleen, possible  thyroid,  involvements.  Marrow is negative on PET.  bone marrow biopsy negative.   IPI score is 4. CNS IPI - 4 S/p 6 cycle of R-Pola -CHP with GCSF - 5 cycles of IT MTX/hydrocortisone.  PET restaging 8 weeks after chemo showed CR, Deuville 21 January 2024, CT scan showed slight increase of one of his liver lesions. peripheral right lobe segment V/VIII lesion 3.5 x 2.2 cm,previously 3.0 x 2.1 cm I will obtain PET scan for reevaluation.  Port-A-Cath in place Recommend port flush Q6-8 weeks.   Unintentional weight loss Possibly secondary to uncontrolled diabetes. Obtain PET scan to rule out lymphoma recurrence.  Type 2 diabetes mellitus (HCC) Poorly controlled diabetes with A1c of 12.7. Patient follows up with primary care provider and diabetes regimen has been adjusted.  He is not on insulin. Encourage patient to be compliant with diabetic diet.  Encouraged lifestyle modification  Hyponatremia Chronic, continue monitor  Orders Placed This Encounter  Procedures   NM PET Image Restag (PS) Skull Base To Thigh    Standing Status:   Future    Expected Date:   02/18/2024    Expiration Date:   05/18/2024    If indicated for the ordered procedure, I authorize the administration of a radiopharmaceutical per Radiology protocol:   Yes    Preferred imaging location?:   [facility]    Follow up to be determined.  Pending PET  scan results.  All questions were answered. The patient knows to call the clinic with any problems, questions or concerns.  Call Babara, MD, PhD [facility] Hematology Oncology 02/04/2024     HISTORY OF PRESENTING ILLNESS:   03/22/2022, patient presented to emergency room for evaluation of right side lower chest/upper abdomen/flank pain, intermittent low-grade fever and chills, night sweats and unintentional weight loss of 20 to 30 pounds over the past 6 months.  He finished a course of antibiotics a few months ago for unknown infection and symptom has not improved.  Oncology History  Diffuse large B cell lymphoma (HCC)  03/22/2022 Imaging   CT chest angiogram, CT abdomen pelvis w contrast  1. No acute pulmonary embolism. 2. There are multiple heterogeneously enhancing masses throughout the liver, several which demonstrate capsular retraction. No definitive extrahepatic primary is identified. Findings could reflect multifocal hepatocellular carcinoma/cholangiocarcinoma, lymphoma or abscess. Recommend correlation with blood chemistry values. Liver MRI with and without contrast could be of assistance in further characterizing these masses. 3. Splenomegaly.4. Multiple borderline enlarged lymph nodes above and below the diaphragm. 5. Homogeneously enlarged thyroid gland, nonspecific. Consider further dedicated evaluation with nonemergent thyroid ultrasound. 6. Mild bronchial wall thickening with bibasilar atelectasis. This could reflect a nonspecific bronchitis.   03/22/2022 Imaging   MRI liver w wo contrast 1. Bilobar hepatic lesions demonstrate irregular contour with minimally increased T2 signal, hypointense intrinsic T1 signal, postcontrast enhancement that is slowly increases and is most prominent in intensities 5 minutes postcontrast administration as well as  a peripheral rim of reduced diffusivity.  2. Enlarged upper abdominal lymph nodes, likely reflect metastatic nodal disease  involvement. 3. Enhancing lesions in the right tenth rib and L2 vertebral body,concerning for osseous metastatic disease. 4. Prominent subcentimeter pericardiophrenic and internal mammary lymph nodes also suspicious for nodal metastatic disease.    03/31/2022 Cancer Staging   Staging form: Hodgkin and Non-Hodgkin Lymphoma, AJCC 8th Edition - Clinical stage from 03/31/2022: Stage IV (Diffuse large B-cell lymphoma) - Signed by Babara Call, MD on 04/19/2022 Stage prefix: Initial diagnosis International Prognostic Index (IPI) score: Score 4 International Prognostic Index risk group: High risk DLBCL cell of origin (COO): Activated B-cell   04/03/2022 Initial Diagnosis   Large cell lymphoma (HCC)  04/03/2022, ultrasound-guided liver biopsy showed large B-cell lymphoma.  Additional IHC is pending for subclassification as well as ancillary FISH testing for rearrangementds   04/16/2022 Imaging   PET initial staging showed 1. Multiple bulky intensely hypermetabolic liver masses throughout both liver lobes, compatible with reported lymphoma. Deauville score 5. 2. Mild splenomegaly with mild splenic hypermetabolism, compatible with lymphoma. No enlarged or hypermetabolic lymph nodes. No hypermetabolic skeletal activity. 3. 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 4. Chronic findings include: Aortic Atherosclerosis.Mild sigmoid diverticulosis.     04/20/2022 Procedure   Medi port placed by IR   04/22/2022 -  Chemotherapy   Patient is on Treatment Plan : NON-HODGKINS LYMPHOMA R-POLA CHP q21d     06/05/2022 Imaging   Restaging PET scan showed -PET-CT findings consistent with a complete metabolic response. No residual hypermetabolism involving the liver (Deauville 1). All measurable liver lesions are much smaller.   Mild residual FDG uptake in the thyroid gland. This could be due to treated thyroiditis. Treated thyroid gland involvement  with lymphoma is also possible (Deauville 2).    10/13/2022 Imaging   PET scan 8 weeks after chemotherapy  showed  1. No signs of residual or recurrent FDG avid tumor within the neck,chest, abdomen or pelvis. (Deauville criteria 1). 2. Continued decrease in size of treated lesions within the liver. 3. Similar appearance of mild diffuse increased uptake within both lobes of thyroid gland. Consider correlation with thyroid function tests. 4.  Aortic Atherosclerosis   07/21/2023 Imaging   CT chest abdomen pelvis w contrast showed 1. Decreased size and conspicuity of the hypodense hepatic lesions with overlying capsular retraction. 2. Increased size of prominent mediastinal and upper abdominal lymph nodes, not pathologically enlarged by size criteria, nonspecific. 3. New 4 mm right middle lobe pulmonary nodule and new peripheral left lower lobe ground-glass pulmonary nodule measuring 14 mm, nonspecific and possibly infectious/inflammatory. Consider attention on short-term interval follow-up chest CT. 4. Colonic diverticulosis without findings of acute diverticulitis. 5.  Aortic Atherosclerosis    10/27/2023 Imaging   CT chest abdomen pelvis with contrast showed  1. Decreased size of the hypodense hepatic lesions with overlying capsular retraction. 2. Stable prominent mediastinal and upper abdominal lymph nodes. 3. Stable bilateral pulmonary nodules. 4. No new or progressive disease in the chest, abdomen or pelvis   01/26/2024 Imaging   CT chest abdomen pelvis with contrast showed 1. Multiple very ill-defined, subtly hypodense treated liver lesions are again seen, largest lesion slightly more conspicuous than on prior examination, in the peripheral right lobe of the liver, hepatic segment V/VIII. Other lesions not obviously changed. Appearance of the hepatic lesions is probably sensitive to precise phase of contrast administration, and multiphasic MRI would likely be helpful adjunct to  assess  for burden of hepatic metastatic disease. 2. Diminished conspicuity of a nodule in the medial right lower lobe, which is much more bandlike and resembling scarring or atelectasis on today's examination. Attention on follow-up. Previously seen ground-glass opacity in the dependent left lower lobe is resolved. 3. Unchanged prominent subcentimeter abdominal lymph nodes. No new lymphadenopathy in the chest, abdomen, or pelvis. 4. Diffuse bilateral bronchial wall thickening, consistent with nonspecific infectious or inflammatory bronchitis.   Aortic Atherosclerosis (ICD10-I70.0).     INTERVAL HISTORY Joe Adams is a 63 y.o. male who has above history reviewed by me today presents for follow up visit for large B-cell lymphoma.   Today he reports feeling well.  Patient has lost 10 pounds since last visit in February 2025.SABRA  Appetite is good.  As part of workup, 01/14/2024, A1c was found to be 12.4.  This was felt to be possibly attributing to his weight loss.  Patient follows with primary care provider and is on insulin treatments now.  He also had a CT scan for workup of unintentional weight loss.   MEDICAL HISTORY:  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   Thyroid disease     SURGICAL HISTORY: Past Surgical History:  Procedure Laterality Date   COLONOSCOPY N/A 11/18/2016   Procedure: COLONOSCOPY IN O.R;  Surgeon: Dellie Louanne MATSU, MD;  Location: [facility] ORS;  Service: General;  Laterality: N/A;   COLONOSCOPY WITH PROPOFOL N/A 11/18/2016   Procedure: COLONOSCOPY WITH PROPOFOL;  Surgeon: Dellie Louanne MATSU, MD;  Location: [facility] ENDOSCOPY;  Service: Endoscopy;  Laterality: N/A;   HEMORRHOID SURGERY N/A 11/18/2016   Procedure: HEMORRHOIDECTOMY;  Surgeon:  Dellie Louanne MATSU, MD;  Location: [facility] ORS;  Service: General;  Laterality: N/A;   IR IMAGING GUIDED PORT INSERTION  04/20/2022   TONSILLECTOMY     as a child    SOCIAL HISTORY: Social History   Socioeconomic History   Marital status: Divorced    Spouse name: Not on file   Number of children: Not on file   Years of education: Not on file   Highest education level: Not on file  Occupational History   Not on file  Tobacco Use   Smoking status: Never   Smokeless tobacco: Never  Vaping Use   Vaping status: Never Used  Substance and Sexual Activity   Alcohol use: Not Currently    Alcohol/week: 1.0 standard drink of alcohol    Types: 1 Shots of liquor per week    Comment: occ   Drug use: No   Sexual activity: Not on file  Other Topics Concern   Not on file  Social History Narrative   Not on file   Social Drivers of Health   Financial Resource Strain: Low Risk  (04/15/2022)   Overall Financial Resource Strain (CARDIA)    Difficulty of Paying Living Expenses: Not very hard  Recent Concern: Financial Resource Strain - Medium Risk (04/15/2022)   Overall Financial Resource Strain (CARDIA)    Difficulty of Paying Living Expenses: Somewhat hard  Food Insecurity: No Food Insecurity (04/15/2022)   Hunger Vital Sign    Worried About Running Out of Food in the Last Year: Never true    Ran Out of Food in the Last Year: Never  true  Transportation Needs: No Transportation Needs (06/16/2022)   PRAPARE - Administrator, Civil Service (Medical): No    Lack of Transportation (Non-Medical): No  Physical Activity: Inactive (04/15/2022)   Exercise Vital Sign    Days of Exercise per Week: 0 days    Minutes of Exercise per Session: 0 min  Stress: Stress Concern Present (04/15/2022)   [employer] of Occupational Health - Occupational Stress Questionnaire    Feeling of Stress : To some extent  Social Connections: Moderately Integrated (04/15/2022)   Social Connection  and Isolation Panel    Frequency of Communication with Friends and Family: Three times a week    Frequency of Social Gatherings with Friends and Family: Twice a week    Attends Religious Services: 1 to 4 times per year    Active Member of [employer] or Organizations: No    Attends Banker Meetings: Never    Marital Status: Living with partner  Intimate Partner Violence: Not At Risk (04/15/2022)   Humiliation, Afraid, Rape, and Kick questionnaire    Fear of Current or Ex-Partner: No    Emotionally Abused: No    Physically Abused: No    Sexually Abused: No    FAMILY HISTORY: Family History  Problem Relation Age of Onset   Cancer Other    Cancer Paternal Uncle     ALLERGIES:  has no known allergies.  MEDICATIONS:  Current Outpatient Medications  Medication Sig Dispense Refill   insulin glargine (LANTUS SOLOSTAR) 100 UNIT/ML Solostar Pen LANTUS SOLOSTAR 100 UNIT/ML SOPN     levothyroxine (SYNTHROID) 150 MCG tablet Take 150 mcg by mouth daily.     lidocaine-prilocaine (EMLA) cream Apply to affected area once 30 g 3   metFORMIN (GLUCOPHAGE) 1000 MG tablet 2 (two) times daily.     Acetaminophen (TYLENOL EXTRA STRENGTH PO) Take 1 tablet by mouth every 6 (six) hours as needed (constipation). (Patient not taking: Reported on 02/04/2024)     Glucose Blood (TRUE METRIX BLOOD GLUCOSE TEST VI) as directed. (Patient not taking: Reported on 02/04/2024)     LANTUS SOLOSTAR 100 UNIT/ML Solostar Pen Inject into the skin. Pt is on a sliding scale; 20 units if blood glucose is at 200; takes 25 units if blood glucose on is 225 (Patient not taking: Reported on 02/04/2024)     TRUEPLUS 5-BEVEL PEN NEEDLES 31G X 6 MM MISC  (Patient not taking: Reported on 02/04/2024)     No current facility-administered medications for this visit.   Facility-Administered Medications Ordered in Other Visits  Medication Dose Route Frequency Provider Last Rate Last Admin   heparin lock flush 100 unit/mL  500 Units  Intravenous Once Babara Call, MD        Review of Systems  Constitutional:  Negative for appetite change, chills, fatigue, fever and unexpected weight change.  HENT:   Negative for voice change.   Eyes:  Negative for eye problems and icterus.  Respiratory:  Negative for chest tightness, cough and shortness of breath.   Cardiovascular:  Negative for chest pain and leg swelling.  Gastrointestinal:  Negative for abdominal distention and abdominal pain.  Endocrine: Negative for hot flashes.  Genitourinary:  Negative for difficulty urinating, dysuria and frequency.   Musculoskeletal:  Negative for arthralgias.  Skin:  Negative for itching and rash.  Neurological:  Negative for light-headedness and numbness.  Hematological:  Negative for adenopathy. Does not bruise/bleed easily.  Psychiatric/Behavioral:  Negative for confusion.    PHYSICAL EXAMINATION:  ECOG PERFORMANCE STATUS: 1 - Symptomatic but completely ambulatory Vitals:   02/04/24 1023  BP: 120/88  Pulse: 80  Resp: 18  Temp: (!) 97.1 F (36.2 C)  SpO2: 98%   Filed Weights   02/04/24 1023  Weight: 178 lb 12.8 oz (81.1 kg)    Physical Exam Constitutional:      General: He is not in acute distress. HENT:     Head: Normocephalic and atraumatic.  Eyes:     General: No scleral icterus. Cardiovascular:     Rate and Rhythm: Normal rate.  Pulmonary:     Effort: Pulmonary effort is normal. No respiratory distress.  Abdominal:     General: There is no distension.  Musculoskeletal:        General: Normal range of motion.     Cervical back: Normal range of motion and neck supple.  Skin:    Findings: No rash.  Neurological:     Mental Status: He is alert and oriented to person, place, and time. Mental status is at baseline.     Cranial Nerves: No cranial nerve deficit.  Psychiatric:        Mood and Affect: Mood normal.     LABORATORY DATA:  I have reviewed the data as listed    Latest Ref Rng & Units 02/04/2024   10:13  AM 01/14/2024    9:41 AM 10/27/2023   11:03 AM  CBC  WBC 4.0 - 10.5 K/uL 6.8  6.4  5.9   Hemoglobin 13.0 - 17.0 g/dL 85.3  84.7  85.7   Hematocrit 39.0 - 52.0 % 41.5  42.9  39.3   Platelets 150 - 400 K/uL 164  185  157       Latest Ref Rng & Units 02/04/2024   10:13 AM 01/14/2024    9:41 AM 10/27/2023   11:03 AM  CMP  Glucose 70 - 99 mg/dL 738  592  685   BUN 8 - 23 mg/dL 29  18  22    Creatinine 0.61 - 1.24 mg/dL 8.97  9.02  9.02   Sodium 135 - 145 mmol/L 132  129  133   Potassium 3.5 - 5.1 mmol/L 3.7  4.5  3.8   Chloride 98 - 111 mmol/L 100  96  102   CO2 22 - 32 mmol/L 21  23  20    Calcium 8.9 - 10.3 mg/dL 9.6  9.2  8.7   Total Protein 6.5 - 8.1 g/dL 7.0  6.8  7.1   Total Bilirubin 0.0 - 1.2 mg/dL 0.9  1.0  1.3   Alkaline Phos 38 - 126 U/L 95  143  85   AST 15 - 41 U/L 28  22  31    ALT 0 - 44 U/L 30  31  39       RADIOGRAPHIC STUDIES: I have personally reviewed the radiological images as listed and agreed with the findings in the report. CT CHEST ABDOMEN PELVIS W CONTRAST Result Date: 02/03/2024 CLINICAL DATA:  Non-Hodgkin's lymphoma, diffuse large B-cell lymphoma * Tracking Code: BO * EXAM: CT CHEST, ABDOMEN, AND PELVIS WITH CONTRAST TECHNIQUE: Multidetector CT imaging of the chest, abdomen and pelvis was performed following the standard protocol during bolus administration of intravenous contrast. RADIATION DOSE REDUCTION: This exam was performed according to the departmental dose-optimization program which includes automated exposure control, adjustment of the mA and/or kV according to patient size and/or use of iterative reconstruction technique. CONTRAST:  100mL OMNIPAQUE IOHEXOL 300 MG/ML  SOLN COMPARISON:  10/27/2023 FINDINGS: CT CHEST FINDINGS Cardiovascular: Right chest port catheter. Scattered aortic atherosclerosis. Normal heart size. No pericardial effusion. Mediastinum/Nodes: No enlarged mediastinal, hilar, or axillary lymph nodes. Thyroid gland, trachea, and esophagus  demonstrate no significant findings. Lungs/Pleura: Diffuse bilateral bronchial wall thickening. Diminished conspicuity of a nodule in the medial right lower lobe, which is much more bandlike and resembling scarring or atelectasis on today's examination (series 4, image 100). Previously measured at 1.0 cm in axial diameter this is no greater than 0.5 cm when measured similarly on today's examination. Previously seen ground-glass opacity in the dependent left lower lobe is resolved. No pleural effusion or pneumothorax. Musculoskeletal: No chest wall abnormality. No acute osseous findings. CT ABDOMEN PELVIS FINDINGS Hepatobiliary: Multiple very ill-defined, subtly hypodense treated liver lesions are again seen, largest lesion slightly more conspicuous than on prior examination, in the peripheral right lobe of the liver, hepatic segment V/VIII measuring 3.5 x 2.2 cm, previously 3.0 x 2.1 cm when measured similarly (series 2, image 58). Other lesions not obviously changed. No gallstones, gallbladder wall thickening, or biliary dilatation. Pancreas: Unremarkable. No pancreatic ductal dilatation or surrounding inflammatory changes. Spleen: Normal in size without significant abnormality. Adrenals/Urinary Tract: Adrenal glands are unremarkable. Kidneys are normal, without renal calculi, solid lesion, or hydronephrosis. Bladder is unremarkable. Stomach/Bowel: Stomach is within normal limits. Appendix appears normal. No evidence of bowel wall thickening, distention, or inflammatory changes. Sigmoid diverticulosis. Vascular/Lymphatic: No significant vascular findings are present. Unchanged prominent subcentimeter abdominal lymph nodes, for example in the gastrohepatic ligament (series 2, image 63) and portacaval stations (series 2, image 60). Reproductive: No mass or other abnormality. Other: Small fat containing right inguinal hernia.  No ascites. Musculoskeletal: No acute osseous findings. IMPRESSION: 1. Multiple very  ill-defined, subtly hypodense treated liver lesions are again seen, largest lesion slightly more conspicuous than on prior examination, in the peripheral right lobe of the liver, hepatic segment V/VIII. Other lesions not obviously changed. Appearance of the hepatic lesions is probably sensitive to precise phase of contrast administration, and multiphasic MRI would likely be helpful adjunct to assess for burden of hepatic metastatic disease. 2. Diminished conspicuity of a nodule in the medial right lower lobe, which is much more bandlike and resembling scarring or atelectasis on today's examination. Attention on follow-up. Previously seen ground-glass opacity in the dependent left lower lobe is resolved. 3. Unchanged prominent subcentimeter abdominal lymph nodes. No new lymphadenopathy in the chest, abdomen, or pelvis. 4. Diffuse bilateral bronchial wall thickening, consistent with nonspecific infectious or inflammatory bronchitis. Aortic Atherosclerosis (ICD10-I70.0). Electronically Signed   By: Marolyn JONETTA Jaksch M.D.   On: 02/03/2024 13:56

## 2024-02-05 NOTE — Assessment & Plan Note (Signed)
 Poorly controlled diabetes with A1c of 12.7. Patient follows up with primary care provider and diabetes regimen has been adjusted.  He is not on insulin. Encourage patient to be compliant with diabetic diet.  Encouraged lifestyle modification

## 2024-02-18 ENCOUNTER — Ambulatory Visit
Admission: RE | Admit: 2024-02-18 | Discharge: 2024-02-18 | Disposition: A | Discharge status: Home / Self Care | Source: Ambulatory Visit | Attending: Oncology | Admitting: Oncology

## 2024-02-18 DIAGNOSIS — R932 Abnormal findings on diagnostic imaging of liver and biliary tract: Secondary | ICD-10-CM | POA: Insufficient documentation

## 2024-02-18 DIAGNOSIS — E049 Nontoxic goiter, unspecified: Secondary | ICD-10-CM | POA: Diagnosis not present

## 2024-02-18 DIAGNOSIS — R933 Abnormal findings on diagnostic imaging of other parts of digestive tract: Secondary | ICD-10-CM | POA: Insufficient documentation

## 2024-02-18 DIAGNOSIS — I700 Atherosclerosis of aorta: Secondary | ICD-10-CM | POA: Diagnosis not present

## 2024-02-18 DIAGNOSIS — C8338 Diffuse large B-cell lymphoma, lymph nodes of multiple sites: Secondary | ICD-10-CM | POA: Insufficient documentation

## 2024-02-18 LAB — GLUCOSE, CAPILLARY: Glucose-Capillary: 169 mg/dL — ABNORMAL HIGH (ref 70–99)

## 2024-02-18 MED ORDER — FLUDEOXYGLUCOSE F - 18 (FDG) INJECTION
9.5200 | Freq: Once | INTRAVENOUS | Status: AC | PRN
  Administered 2024-02-18: 9.52 via INTRAVENOUS

## 2024-02-22 ENCOUNTER — Telehealth: Payer: Self-pay | Admitting: *Deleted

## 2024-02-22 NOTE — Telephone Encounter (Signed)
 I told the patient that it is not been finalized we do not have the scan results for this.  I told him as soon as the scan results come out you are able to see it at the same time.  He says that he has MyChart but he changed his number for his cell phone.  He says that he do not know how to do that but he will get his kids to help him.  I did give him the telephone number for people that have issues with the MyChart not being able to get in and out I told him it was [phone].  Also he says that when is he getting get an appointment to go over the scans and right now there is only flushes 9/24 and 11/20 for the port.  I will send it over to Dr. Layvonne team see what they can do.

## 2024-02-25 ENCOUNTER — Encounter: Payer: Self-pay | Admitting: Oncology

## 2024-02-25 ENCOUNTER — Other Ambulatory Visit: Payer: Self-pay

## 2024-02-25 DIAGNOSIS — E041 Nontoxic single thyroid nodule: Secondary | ICD-10-CM

## 2024-02-25 DIAGNOSIS — C8338 Diffuse large B-cell lymphoma, lymph nodes of multiple sites: Secondary | ICD-10-CM

## 2024-02-25 NOTE — Telephone Encounter (Signed)
 Spoke to pt and informed him of PET scan results per Dr. Babara. Liver lesion showed no activity which is good. There is low activity in the neck lymph nodes. There are no findings to explain the weight loss- it is most likley due to diabetes. Also, scan showed thyroid  nodule and she recommends a thyroid  ultrasound for further evaluation. Pt verbalized understanding.   Pt would like a call with appt details:   US  thyroid   port flush with lab/ MD in 3 months (cbc, cmp, ldh)  Keep appt on 9/24 as scheduled.  Ok to move [place] lab in Nov to be in the 3 mth follow up

## 2024-02-26 ENCOUNTER — Other Ambulatory Visit: Payer: Self-pay

## 2024-02-29 ENCOUNTER — Ambulatory Visit
Admission: RE | Admit: 2024-02-29 | Discharge: 2024-02-29 | Disposition: A | Discharge status: Home / Self Care | Source: Ambulatory Visit | Attending: Oncology | Admitting: Oncology

## 2024-02-29 DIAGNOSIS — E041 Nontoxic single thyroid nodule: Secondary | ICD-10-CM | POA: Insufficient documentation

## 2024-03-01 ENCOUNTER — Telehealth: Payer: Self-pay | Admitting: *Deleted

## 2024-03-01 NOTE — Telephone Encounter (Signed)
 Called patient to verify appointment times.

## 2024-03-07 ENCOUNTER — Telehealth: Payer: Self-pay | Admitting: *Deleted

## 2024-03-07 ENCOUNTER — Ambulatory Visit: Payer: Self-pay | Admitting: Oncology

## 2024-03-07 DIAGNOSIS — R9389 Abnormal findings on diagnostic imaging of other specified body structures: Secondary | ICD-10-CM

## 2024-03-07 NOTE — Telephone Encounter (Signed)
 The patient says that he wanted to get the results of the scan and the thyroid  ultrasound and go over all this it looks like the thyroid  u/s complete does not have  results

## 2024-03-08 NOTE — Progress Notes (Signed)
 Called patient and informed him of US  results and plan for thyroid  bx. Pt agreeable to biopys. Message sent to IR. Date pending.

## 2024-03-08 NOTE — Telephone Encounter (Signed)
 Results relayed to pt.

## 2024-03-08 NOTE — Telephone Encounter (Signed)
-----   Message from Zelphia Cap sent at 03/07/2024 11:27 PM EDT ----- US  thyroid  showed no definitive thyroid  nodules.  Thyroid  is enlarged and has some activity. It is either chronic inflammation of thyroid  vs involvement with lymphoma. Recommend biopsy of thyroid  please arrange. Thanks.  ----- Message ----- From: Interface, Rad Results In Sent: 03/07/2024  12:19 PM EDT To: Zelphia Cap, MD

## 2024-03-09 NOTE — Progress Notes (Signed)
 Received message from IR stating that they do not do non nodule biopsies of the thyroid . Per Dr. Babara, refer to endocrinology for further eval. Pt notifed and agreeable to plan.   Referral faxed to [facility] endocrinology. Fax confirmation received.

## 2024-03-15 ENCOUNTER — Inpatient Hospital Stay: Attending: Oncology

## 2024-03-15 DIAGNOSIS — Z452 Encounter for adjustment and management of vascular access device: Secondary | ICD-10-CM | POA: Diagnosis present

## 2024-03-15 DIAGNOSIS — C83398 Diffuse large b-cell lymphoma of other extranodal and solid organ sites: Secondary | ICD-10-CM | POA: Diagnosis present

## 2024-03-29 ENCOUNTER — Encounter: Payer: Self-pay | Admitting: Oncology

## 2024-03-29 NOTE — Telephone Encounter (Signed)
 Pt scheduled to see Dr. Albertha endocrinology on 10/17

## 2024-04-04 ENCOUNTER — Other Ambulatory Visit: Payer: Self-pay

## 2024-04-21 ENCOUNTER — Encounter: Payer: Self-pay | Admitting: Oncology

## 2024-04-28 ENCOUNTER — Encounter: Payer: Self-pay | Admitting: Internal Medicine

## 2024-05-05 ENCOUNTER — Telehealth: Payer: Self-pay

## 2024-05-05 NOTE — Telephone Encounter (Signed)
 Per Dr. Babara, she has discussed with Dr. Damian and they agreed to repeat thyroid  bx and send additional testing. Pt notified and advised that Dr. Brigid office will contact him for further scheduling. Pt verbalized understanding.

## 2024-05-11 ENCOUNTER — Other Ambulatory Visit

## 2024-05-17 ENCOUNTER — Encounter: Payer: Self-pay | Admitting: Internal Medicine

## 2024-05-22 ENCOUNTER — Telehealth: Payer: Self-pay | Admitting: Oncology

## 2024-05-22 NOTE — Telephone Encounter (Signed)
 Pt called to verify appt date/time for next week - LH

## 2024-05-31 ENCOUNTER — Inpatient Hospital Stay: Attending: Oncology | Admitting: Oncology

## 2024-05-31 ENCOUNTER — Encounter: Payer: Self-pay | Admitting: Oncology

## 2024-05-31 ENCOUNTER — Inpatient Hospital Stay: Attending: Oncology

## 2024-05-31 ENCOUNTER — Telehealth: Payer: Self-pay

## 2024-05-31 VITALS — BP 116/74 | HR 99 | Temp 97.3°F | Resp 18 | Wt 188.5 lb

## 2024-05-31 DIAGNOSIS — C8338 Diffuse large B-cell lymphoma, lymph nodes of multiple sites: Secondary | ICD-10-CM | POA: Diagnosis not present

## 2024-05-31 DIAGNOSIS — Z95828 Presence of other vascular implants and grafts: Secondary | ICD-10-CM | POA: Diagnosis not present

## 2024-05-31 DIAGNOSIS — Z809 Family history of malignant neoplasm, unspecified: Secondary | ICD-10-CM | POA: Diagnosis not present

## 2024-05-31 DIAGNOSIS — C83398 Diffuse large b-cell lymphoma of other extranodal and solid organ sites: Secondary | ICD-10-CM | POA: Diagnosis present

## 2024-05-31 DIAGNOSIS — R634 Abnormal weight loss: Secondary | ICD-10-CM | POA: Diagnosis not present

## 2024-05-31 LAB — CBC WITH DIFFERENTIAL (CANCER CENTER ONLY)
Abs Immature Granulocytes: 0.05 K/uL (ref 0.00–0.07)
Basophils Absolute: 0.1 K/uL (ref 0.0–0.1)
Basophils Relative: 1 %
Eosinophils Absolute: 0.1 K/uL (ref 0.0–0.5)
Eosinophils Relative: 2 %
HCT: 41 % (ref 39.0–52.0)
Hemoglobin: 14.6 g/dL (ref 13.0–17.0)
Immature Granulocytes: 1 %
Lymphocytes Relative: 25 %
Lymphs Abs: 1.8 K/uL (ref 0.7–4.0)
MCH: 31.3 pg (ref 26.0–34.0)
MCHC: 35.6 g/dL (ref 30.0–36.0)
MCV: 87.8 fL (ref 80.0–100.0)
Monocytes Absolute: 0.7 K/uL (ref 0.1–1.0)
Monocytes Relative: 10 %
Neutro Abs: 4.6 K/uL (ref 1.7–7.7)
Neutrophils Relative %: 61 %
Platelet Count: 151 K/uL (ref 150–400)
RBC: 4.67 MIL/uL (ref 4.22–5.81)
RDW: 12.7 % (ref 11.5–15.5)
WBC Count: 7.3 K/uL (ref 4.0–10.5)
nRBC: 0 % (ref 0.0–0.2)

## 2024-05-31 LAB — LACTATE DEHYDROGENASE: LDH: 159 U/L (ref 105–235)

## 2024-05-31 LAB — CMP (CANCER CENTER ONLY)
ALT: 41 U/L (ref 0–44)
AST: 30 U/L (ref 15–41)
Albumin: 4.7 g/dL (ref 3.5–5.0)
Alkaline Phosphatase: 80 U/L (ref 38–126)
Anion gap: 18 — ABNORMAL HIGH (ref 5–15)
BUN: 23 mg/dL (ref 8–23)
CO2: 20 mmol/L — ABNORMAL LOW (ref 22–32)
Calcium: 9.8 mg/dL (ref 8.9–10.3)
Chloride: 101 mmol/L (ref 98–111)
Creatinine: 0.97 mg/dL (ref 0.61–1.24)
GFR, Estimated: >60 mL/min (ref >60)
Glucose, Bld: 187 mg/dL — ABNORMAL HIGH (ref 70–99)
Potassium: 4.5 mmol/L (ref 3.5–5.1)
Sodium: 139 mmol/L (ref 135–145)
Total Bilirubin: 0.4 mg/dL (ref 0.0–1.2)
Total Protein: 6.8 g/dL (ref 6.5–8.1)

## 2024-05-31 NOTE — Assessment & Plan Note (Signed)
 secondary to uncontrolled diabetes. Resolved.

## 2024-05-31 NOTE — Assessment & Plan Note (Signed)
 Recommend port flush Q6-8 weeks.

## 2024-05-31 NOTE — Assessment & Plan Note (Addendum)
 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  Marrow is negative on PET.  bone marrow biopsy negative.   IPI score is 4. CNS IPI - 4 S/p 6 cycle of R-Pola -CHP with GCSF - 5 cycles of IT MTX/hydrocortisone .  PET restaging 8 weeks after chemo showed CR, [place] 21 January 2024, CT scan showed slight increase of one of his liver lesions. peripheral right lobe segment V/VIII lesion 3.5 x 2.2 cm,previously 3.0 x 2.1 cm August 2025 PET scan showed no hypermetabolism in liver.  - increased hypermetabolism in thyroid  and colon, and Small cervical lymph nodes  Thyroid  biopsy and flowcytometry results were reviewed. Atypical lymphocytes, negative flowcytometry. Possible thyroiditis/inflammation.  Patient is asymptotic, gained weight.  I will repeat PET with 6 months interval.

## 2024-05-31 NOTE — Progress Notes (Signed)
 Hematology/Oncology Progress note [phone] [phone]      CHIEF COMPLAINTS/REASON FOR VISIT:  Stage IV DLBCL   ASSESSMENT & PLAN:   Cancer Staging  Diffuse large B cell lymphoma (HCC) Staging form: Hodgkin and Non-Hodgkin Lymphoma, AJCC 8th Edition - Clinical stage from 03/31/2022: Stage IV (Diffuse large B-cell lymphoma) - Signed by Babara Call, MD on 04/19/2022   Diffuse large B cell lymphoma (HCC) BCL6 cmyc IHC positive, FISH negative for BCL2, BCL6, and CMYC rearrangement.  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  Marrow is negative on PET.  bone marrow biopsy negative.   IPI score is 4. CNS IPI - 4 S/p 6 cycle of R-Pola -CHP with GCSF - 5 cycles of IT MTX/hydrocortisone .  PET restaging 8 weeks after chemo showed CR, [place] 21 January 2024, CT scan showed slight increase of one of his liver lesions. peripheral right lobe segment V/VIII lesion 3.5 x 2.2 cm,previously 3.0 x 2.1 cm August 2025 PET scan showed no hypermetabolism in liver.  - increased hypermetabolism in thyroid  and colon, and Small cervical lymph nodes  Thyroid  biopsy and flowcytometry results were reviewed. Atypical lymphocytes, negative flowcytometry. Possible thyroiditis/inflammation.  Patient is asymptotic, gained weight.  I will repeat PET with 6 months interval.   Port-A-Cath in place Recommend port flush Q6-8 weeks.   Unintentional weight loss secondary to uncontrolled diabetes. Resolved.   Orders Placed This Encounter  Procedures   NM PET Image Restag (PS) Skull Base To Thigh    Standing Status:   Future    Expected Date:   08/01/2024    Expiration Date:   10/30/2024    If indicated for the ordered procedure, I authorize the administration of a radiopharmaceutical per Radiology protocol:   Yes    Preferred imaging location?:   [facility]   CMP (Cancer Center only)    Standing Status:   Future    Expected Date:   08/08/2024     Expiration Date:   11/06/2024   CBC with Differential (Cancer Center Only)    Standing Status:   Future    Expected Date:   08/08/2024    Expiration Date:   11/06/2024    Follow up to be determined.  Pending PET scan results.  All questions were answered. The patient knows to call the clinic with any problems, questions or concerns.  Call Babara, MD, PhD [facility] Hematology Oncology 05/31/2024     HISTORY OF PRESENTING ILLNESS:   03/22/2022, patient presented to emergency room for evaluation of right side lower chest/upper abdomen/flank pain, intermittent low-grade fever and chills, night sweats and unintentional weight loss of 20 to 30 pounds over the past 6 months.  He finished a course of antibiotics a few months ago for unknown infection and symptom has not improved.  Oncology History  Diffuse large B cell lymphoma (HCC)  03/22/2022 Imaging   CT chest angiogram, CT abdomen pelvis w contrast  1. No acute pulmonary embolism. 2. There are multiple heterogeneously enhancing masses throughout the liver, several which demonstrate capsular retraction. No definitive extrahepatic primary is identified. Findings could reflect multifocal hepatocellular carcinoma/cholangiocarcinoma, lymphoma or abscess. Recommend correlation with blood chemistry values. Liver MRI with and without contrast could be of assistance in further characterizing these masses. 3. Splenomegaly.4. Multiple borderline enlarged lymph nodes above and below the diaphragm. 5. Homogeneously enlarged thyroid  gland, nonspecific. Consider further dedicated evaluation with nonemergent thyroid  ultrasound. 6. Mild bronchial wall thickening with bibasilar atelectasis. This  could reflect a nonspecific bronchitis.   03/22/2022 Imaging   MRI liver w wo contrast 1. Bilobar hepatic lesions demonstrate irregular contour with minimally increased T2 signal, hypointense intrinsic T1 signal, postcontrast enhancement that is slowly increases and is  most prominent in intensities 5 minutes postcontrast administration as well as a peripheral rim of reduced diffusivity.  2. Enlarged upper abdominal lymph nodes, likely reflect metastatic nodal disease involvement. 3. Enhancing lesions in the right tenth rib and L2 vertebral body,concerning for osseous metastatic disease. 4. Prominent subcentimeter pericardiophrenic and internal mammary lymph nodes also suspicious for nodal metastatic disease.    03/31/2022 Cancer Staging   Staging form: Hodgkin and Non-Hodgkin Lymphoma, AJCC 8th Edition - Clinical stage from 03/31/2022: Stage IV (Diffuse large B-cell lymphoma) - Signed by Babara Call, MD on 04/19/2022 Stage prefix: Initial diagnosis International Prognostic Index (IPI) score: Score 4 International Prognostic Index risk group: High risk DLBCL cell of origin (COO): Activated B-cell   04/03/2022 Initial Diagnosis   Large cell lymphoma (HCC)  04/03/2022, ultrasound-guided liver biopsy showed large B-cell lymphoma.  Additional IHC is pending for subclassification as well as ancillary FISH testing for rearrangementds   04/16/2022 Imaging   PET initial staging showed 1. Multiple bulky intensely hypermetabolic liver masses throughout both liver lobes, compatible with reported lymphoma. Deauville score 5. 2. Mild splenomegaly with mild splenic hypermetabolism, compatible with lymphoma. No enlarged or hypermetabolic lymph nodes. No hypermetabolic skeletal activity. 3. Multinodular goiter with intensely hypermetabolic thyroid  nodules, also potentially due to lymphoma. 4. Chronic findings include: Aortic Atherosclerosis.Mild sigmoid diverticulosis.     04/20/2022 Procedure   Medi port placed by IR   04/22/2022 -  Chemotherapy   Patient is on Treatment Plan : NON-HODGKINS LYMPHOMA R-POLA CHP q21d     06/05/2022 Imaging   Restaging PET scan showed -PET-CT findings consistent with a complete metabolic response. No residual hypermetabolism involving  the liver (Deauville 1). All measurable liver lesions are much smaller.   Mild residual FDG uptake in the thyroid  gland. This could be due to treated thyroiditis. Treated thyroid  gland involvement with lymphoma is also possible (Deauville 2).    10/13/2022 Imaging   PET scan 8 weeks after chemotherapy  showed  1. No signs of residual or recurrent FDG avid tumor within the neck,chest, abdomen or pelvis. (Deauville criteria 1). 2. Continued decrease in size of treated lesions within the liver. 3. Similar appearance of mild diffuse increased uptake within both lobes of thyroid  gland. Consider correlation with thyroid  function tests. 4.  Aortic Atherosclerosis   07/21/2023 Imaging   CT chest abdomen pelvis w contrast showed 1. Decreased size and conspicuity of the hypodense hepatic lesions with overlying capsular retraction. 2. Increased size of prominent mediastinal and upper abdominal lymph nodes, not pathologically enlarged by size criteria, nonspecific. 3. New 4 mm right middle lobe pulmonary nodule and new peripheral left lower lobe ground-glass pulmonary nodule measuring 14 mm, nonspecific and possibly infectious/inflammatory. Consider attention on short-term interval follow-up chest CT. 4. Colonic diverticulosis without findings of acute diverticulitis. 5.  Aortic Atherosclerosis    10/27/2023 Imaging   CT chest abdomen pelvis with contrast showed  1. Decreased size of the hypodense hepatic lesions with overlying capsular retraction. 2. Stable prominent mediastinal and upper abdominal lymph nodes. 3. Stable bilateral pulmonary nodules. 4. No new or progressive disease in the chest, abdomen or pelvis   01/26/2024 Imaging   CT chest abdomen pelvis with contrast showed 1. Multiple very ill-defined, subtly hypodense treated liver lesions are again  seen, largest lesion slightly more conspicuous than on prior examination, in the peripheral right lobe of the liver, hepatic segment  V/VIII. Other lesions not obviously changed. Appearance of the hepatic lesions is probably sensitive to precise phase of contrast administration, and multiphasic MRI would likely be helpful adjunct to assess for burden of hepatic metastatic disease. 2. Diminished conspicuity of a nodule in the medial right lower lobe, which is much more bandlike and resembling scarring or atelectasis on today's examination. Attention on follow-up. Previously seen ground-glass opacity in the dependent left lower lobe is resolved. 3. Unchanged prominent subcentimeter abdominal lymph nodes. No new lymphadenopathy in the chest, abdomen, or pelvis. 4. Diffuse bilateral bronchial wall thickening, consistent with nonspecific infectious or inflammatory bronchitis.   Aortic Atherosclerosis (ICD10-I70.0).     INTERVAL HISTORY Joe Adams is a 63 y.o. male who has above history reviewed by me today presents for follow up visit for large B-cell lymphoma.   Today he reports feeling well.   He recently underwent two thyroid  biopsies due to concern for lymphoma recurrence after the initial biopsy revealed atypical lymphocytes. The second biopsy, including flow cytometry, was negative for lymphoma. He has not experienced new neck symptoms and currently feels well.  Recent  demonstrated minimal activity in the neck and a spot in the large bowel. He reports that he has had colonoscopy in 2018 during a hemorrhoid operation. He denies gastrointestinal complaints and is unaware of any additional findings from the prior colonoscopy.  He previously experienced unintentional weight loss, which prompted recent imaging. He attributes the weight loss to diabetes, but has since regained weight and now reports weight stability and preserved appetite. He denies night sweats or other B symptoms.  MEDICAL HISTORY:  Past Medical History:  Diagnosis Date   Acute MI (HCC) 2005   Diabetes mellitus without complication (HCC)    GERD  (gastroesophageal reflux disease)    Headache    Hernia, umbilical    History of kidney stones    Large cell lymphoma (HCC) 04/03/2022   Thyroid  disease     SURGICAL HISTORY: Past Surgical History:  Procedure Laterality Date   COLONOSCOPY N/A 11/18/2016   Procedure: COLONOSCOPY IN O.R;  Surgeon: Dellie Louanne MATSU, MD;  Location: [facility] ORS;  Service: General;  Laterality: N/A;   COLONOSCOPY WITH PROPOFOL  N/A 11/18/2016   Procedure: COLONOSCOPY WITH PROPOFOL ;  Surgeon: Dellie Louanne MATSU, MD;  Location: [facility] ENDOSCOPY;  Service: Endoscopy;  Laterality: N/A;   HEMORRHOID SURGERY N/A 11/18/2016   Procedure: HEMORRHOIDECTOMY;  Surgeon: Dellie Louanne MATSU, MD;  Location: [facility] ORS;  Service: General;  Laterality: N/A;   IR IMAGING GUIDED PORT INSERTION  04/20/2022   TONSILLECTOMY     as a child    SOCIAL HISTORY: Social History   Socioeconomic History   Marital status: Divorced    Spouse name: Not on file   Number of children: Not on file   Years of education: Not on file   Highest education level: Not on file  Occupational History   Not on file  Tobacco Use   Smoking status: Never   Smokeless tobacco: Never  Vaping Use   Vaping status: Never Used  Substance and Sexual Activity   Alcohol use: Not Currently    Alcohol/week: 1.0 standard drink of alcohol    Types: 1 Shots of liquor per week    Comment: occ   Drug use: No   Sexual activity: Not on file  Other Topics Concern   Not on  file  Social History Narrative   Not on file   Social Drivers of Health   Financial Resource Strain: Low Risk  (04/07/2024)   Received from [facility]   Overall Financial Resource Strain (CARDIA)    Difficulty of Paying Living Expenses: Not hard at all  Food Insecurity: No Food Insecurity (04/07/2024)   Received from [facility]   Hunger Vital Sign    Within the past 12 months, you worried that your food would run out before you got the money  to buy more.: Never true    Within the past 12 months, the food you bought just didn't last and you didn't have money to get more.: Never true  Transportation Needs: No Transportation Needs (04/07/2024)   Received from [facility] - Transportation    In the past 12 months, has lack of transportation kept you from medical appointments or from getting medications?: No    Lack of Transportation (Non-Medical): No  Physical Activity: Inactive (04/15/2022)   Exercise Vital Sign    Days of Exercise per Week: 0 days    Minutes of Exercise per Session: 0 min  Stress: Stress Concern Present (04/15/2022)   [employer] of Occupational Health - Occupational Stress Questionnaire    Feeling of Stress : To some extent  Social Connections: Moderately Integrated (04/15/2022)   Social Connection and Isolation Panel    Frequency of Communication with Friends and Family: Three times a week    Frequency of Social Gatherings with Friends and Family: Twice a week    Attends Religious Services: 1 to 4 times per year    Active Member of [employer] or Organizations: No    Attends Banker Meetings: Never    Marital Status: Living with partner  Intimate Partner Violence: Not At Risk (04/15/2022)   Humiliation, Afraid, Rape, and Kick questionnaire    Fear of Current or Ex-Partner: No    Emotionally Abused: No    Physically Abused: No    Sexually Abused: No    FAMILY HISTORY: Family History  Problem Relation Age of Onset   Cancer Other    Cancer Paternal Uncle     ALLERGIES:  has no known allergies.  MEDICATIONS:  Current Outpatient Medications  Medication Sig Dispense Refill   Acetaminophen  (TYLENOL  EXTRA STRENGTH PO) Take 1 tablet by mouth every 6 (six) hours as needed (constipation).     Glucose Blood (TRUE METRIX BLOOD GLUCOSE TEST VI) as directed.     insulin glargine (LANTUS SOLOSTAR) 100 UNIT/ML Solostar Pen LANTUS SOLOSTAR 100 UNIT/ML SOPN      levothyroxine (SYNTHROID) 150 MCG tablet Take 150 mcg by mouth daily.     lidocaine -prilocaine  (EMLA ) cream Apply to affected area once 30 g 3   metFORMIN (GLUCOPHAGE) 1000 MG tablet 2 (two) times daily.     LANTUS SOLOSTAR 100 UNIT/ML Solostar Pen Inject into the skin. Pt is on a sliding scale; 20 units if blood glucose is at 200; takes 25 units if blood glucose on is 225 (Patient not taking: Reported on 05/31/2024)     TRUEPLUS 5-BEVEL PEN NEEDLES 31G X 6 MM MISC  (Patient not taking: Reported on 05/31/2024)     No current facility-administered medications for this visit.   Facility-Administered Medications Ordered in Other Visits  Medication Dose Route Frequency Provider Last Rate Last Admin   heparin  lock flush 100 unit/mL  500 Units Intravenous Once Takiesha Mcdevitt, MD  Review of Systems  Constitutional:  Negative for appetite change, chills, fatigue, fever and unexpected weight change.  HENT:   Negative for voice change.   Eyes:  Negative for eye problems and icterus.  Respiratory:  Negative for chest tightness, cough and shortness of breath.   Cardiovascular:  Negative for chest pain and leg swelling.  Gastrointestinal:  Negative for abdominal distention and abdominal pain.  Endocrine: Negative for hot flashes.  Genitourinary:  Negative for difficulty urinating, dysuria and frequency.   Musculoskeletal:  Negative for arthralgias.  Skin:  Negative for itching and rash.  Neurological:  Negative for light-headedness and numbness.  Hematological:  Negative for adenopathy. Does not bruise/bleed easily.  Psychiatric/Behavioral:  Negative for confusion.    PHYSICAL EXAMINATION: ECOG PERFORMANCE STATUS: 1 - Symptomatic but completely ambulatory Vitals:   05/31/24 1457 05/31/24 1506  BP: (!) 135/92 116/74  Pulse: 99   Resp: 18   Temp: (!) 97.3 F (36.3 C)   SpO2: 97%    Filed Weights   05/31/24 1457  Weight: 188 lb 8 oz (85.5 kg)    Physical Exam Constitutional:       General: He is not in acute distress. HENT:     Head: Normocephalic and atraumatic.  Eyes:     General: No scleral icterus. Cardiovascular:     Rate and Rhythm: Normal rate.  Pulmonary:     Effort: Pulmonary effort is normal. No respiratory distress.  Abdominal:     General: There is no distension.  Musculoskeletal:        General: Normal range of motion.     Cervical back: Normal range of motion and neck supple.  Skin:    Findings: No rash.  Neurological:     Mental Status: He is alert and oriented to person, place, and time. Mental status is at baseline.     Cranial Nerves: No cranial nerve deficit.  Psychiatric:        Mood and Affect: Mood normal.     LABORATORY DATA:  I have reviewed the data as listed    Latest Ref Rng & Units 05/31/2024    2:47 PM 02/04/2024   10:13 AM 01/14/2024    9:41 AM  CBC  WBC 4.0 - 10.5 K/uL 7.3  6.8  6.4   Hemoglobin 13.0 - 17.0 g/dL 85.3  85.3  84.7   Hematocrit 39.0 - 52.0 % 41.0  41.5  42.9   Platelets 150 - 400 K/uL 151  164  185       Latest Ref Rng & Units 05/31/2024    2:47 PM 02/04/2024   10:13 AM 01/14/2024    9:41 AM  CMP  Glucose 70 - 99 mg/dL 812  738  592   BUN 8 - 23 mg/dL 23  29  18    Creatinine 0.61 - 1.24 mg/dL 9.02  8.97  9.02   Sodium 135 - 145 mmol/L 139  132  129   Potassium 3.5 - 5.1 mmol/L 4.5  3.7  4.5   Chloride 98 - 111 mmol/L 101  100  96   CO2 22 - 32 mmol/L 20  21  23    Calcium 8.9 - 10.3 mg/dL 9.8  9.6  9.2   Total Protein 6.5 - 8.1 g/dL 6.8  7.0  6.8   Total Bilirubin 0.0 - 1.2 mg/dL 0.4  0.9  1.0   Alkaline Phos 38 - 126 U/L 80  95  143   AST 15 - 41 U/L 30  28  22   ALT 0 - 44 U/L 41  30  31       RADIOGRAPHIC STUDIES: I have personally reviewed the radiological images as listed and agreed with the findings in the report. No results found.

## 2024-06-02 ENCOUNTER — Other Ambulatory Visit: Payer: Self-pay

## 2024-07-05 ENCOUNTER — Inpatient Hospital Stay: Attending: Oncology

## 2024-07-05 DIAGNOSIS — Z452 Encounter for adjustment and management of vascular access device: Secondary | ICD-10-CM | POA: Insufficient documentation

## 2024-07-05 DIAGNOSIS — C83398 Diffuse large b-cell lymphoma of other extranodal and solid organ sites: Secondary | ICD-10-CM | POA: Diagnosis present

## 2024-08-08 ENCOUNTER — Inpatient Hospital Stay

## 2024-08-08 ENCOUNTER — Inpatient Hospital Stay: Admitting: Oncology

## 2024-08-16 ENCOUNTER — Other Ambulatory Visit
